# Patient Record
Sex: Female | Born: 1940 | Race: Black or African American | Hispanic: No | Marital: Married | Smoking: Never smoker
Health system: Southern US, Community
[De-identification: ages and names within clinical notes are randomized; demographics above are authoritative.]

## PROBLEM LIST (undated history)

## (undated) DIAGNOSIS — K8020 Calculus of gallbladder without cholecystitis without obstruction: Secondary | ICD-10-CM

## (undated) DIAGNOSIS — R748 Abnormal levels of other serum enzymes: Secondary | ICD-10-CM

## (undated) DIAGNOSIS — R002 Palpitations: Secondary | ICD-10-CM

## (undated) DIAGNOSIS — I10 Essential (primary) hypertension: Secondary | ICD-10-CM

## (undated) DIAGNOSIS — F32A Depression, unspecified: Secondary | ICD-10-CM

## (undated) DIAGNOSIS — R7401 Elevation of levels of liver transaminase levels: Secondary | ICD-10-CM

## (undated) DIAGNOSIS — R740 Nonspecific elevation of levels of transaminase and lactic acid dehydrogenase [LDH]: Secondary | ICD-10-CM

## (undated) DIAGNOSIS — E785 Hyperlipidemia, unspecified: Secondary | ICD-10-CM

## (undated) DIAGNOSIS — K219 Gastro-esophageal reflux disease without esophagitis: Secondary | ICD-10-CM

## (undated) DIAGNOSIS — R7402 Elevation of levels of lactic acid dehydrogenase (LDH): Secondary | ICD-10-CM

## (undated) DIAGNOSIS — F329 Major depressive disorder, single episode, unspecified: Secondary | ICD-10-CM

## (undated) DIAGNOSIS — K743 Primary biliary cirrhosis: Secondary | ICD-10-CM

## (undated) DIAGNOSIS — G8929 Other chronic pain: Secondary | ICD-10-CM

## (undated) HISTORY — DX: Abnormal levels of other serum enzymes: R74.8

## (undated) HISTORY — DX: Essential (primary) hypertension: I10

## (undated) HISTORY — DX: Depression, unspecified: F32.A

## (undated) HISTORY — DX: Gastro-esophageal reflux disease without esophagitis: K21.9

## (undated) HISTORY — DX: Elevation of levels of liver transaminase levels: R74.01

## (undated) HISTORY — PX: OTHER SURGICAL HISTORY: SHX169

## (undated) HISTORY — DX: Calculus of gallbladder without cholecystitis without obstruction: K80.20

## (undated) HISTORY — DX: Other chronic pain: G89.29

## (undated) HISTORY — DX: Primary biliary cirrhosis: K74.3

## (undated) HISTORY — DX: Palpitations: R00.2

## (undated) HISTORY — DX: Nonspecific elevation of levels of transaminase and lactic acid dehydrogenase (ldh): R74.0

## (undated) HISTORY — DX: Hyperlipidemia, unspecified: E78.5

## (undated) HISTORY — DX: Elevation of levels of lactic acid dehydrogenase (LDH): R74.02

## (undated) HISTORY — DX: Major depressive disorder, single episode, unspecified: F32.9

---

## 1965-04-28 HISTORY — PX: TONSILLECTOMY: SUR1361

## 1972-04-28 HISTORY — PX: OTHER SURGICAL HISTORY: SHX169

## 2005-06-12 ENCOUNTER — Ambulatory Visit: Payer: Self-pay | Admitting: Family Medicine

## 2005-07-09 ENCOUNTER — Ambulatory Visit: Payer: Self-pay | Admitting: Family Medicine

## 2005-07-28 ENCOUNTER — Ambulatory Visit: Payer: Self-pay | Admitting: Family Medicine

## 2005-10-22 ENCOUNTER — Ambulatory Visit: Payer: Self-pay | Admitting: Family Medicine

## 2005-12-03 ENCOUNTER — Ambulatory Visit: Payer: Self-pay | Admitting: Family Medicine

## 2006-01-21 ENCOUNTER — Ambulatory Visit: Payer: Self-pay | Admitting: Family Medicine

## 2006-03-24 ENCOUNTER — Ambulatory Visit: Payer: Self-pay | Admitting: Family Medicine

## 2006-03-24 ENCOUNTER — Emergency Department (HOSPITAL_COMMUNITY): Admission: EM | Admit: 2006-03-24 | Discharge: 2006-03-24 | Payer: Self-pay | Admitting: *Deleted

## 2006-04-16 ENCOUNTER — Ambulatory Visit: Payer: Self-pay | Admitting: Family Medicine

## 2007-04-23 DIAGNOSIS — E785 Hyperlipidemia, unspecified: Secondary | ICD-10-CM | POA: Insufficient documentation

## 2007-05-31 ENCOUNTER — Ambulatory Visit: Payer: Self-pay | Admitting: Family Medicine

## 2007-06-03 ENCOUNTER — Encounter: Payer: Self-pay | Admitting: Family Medicine

## 2007-06-03 LAB — CONVERTED CEMR LAB: Pap Smear: NORMAL

## 2007-06-15 ENCOUNTER — Other Ambulatory Visit: Admission: RE | Admit: 2007-06-15 | Discharge: 2007-06-15 | Payer: Self-pay | Admitting: Obstetrics and Gynecology

## 2007-06-15 ENCOUNTER — Encounter: Payer: Self-pay | Admitting: Family Medicine

## 2007-06-21 ENCOUNTER — Ambulatory Visit (HOSPITAL_COMMUNITY): Admission: RE | Admit: 2007-06-21 | Discharge: 2007-06-21 | Payer: Self-pay | Admitting: Obstetrics and Gynecology

## 2007-06-29 ENCOUNTER — Ambulatory Visit: Payer: Self-pay | Admitting: Family Medicine

## 2007-07-19 DIAGNOSIS — K219 Gastro-esophageal reflux disease without esophagitis: Secondary | ICD-10-CM

## 2007-07-19 DIAGNOSIS — G8929 Other chronic pain: Secondary | ICD-10-CM | POA: Insufficient documentation

## 2007-07-19 DIAGNOSIS — R002 Palpitations: Secondary | ICD-10-CM | POA: Insufficient documentation

## 2007-07-19 DIAGNOSIS — I10 Essential (primary) hypertension: Secondary | ICD-10-CM

## 2007-07-19 DIAGNOSIS — F329 Major depressive disorder, single episode, unspecified: Secondary | ICD-10-CM | POA: Insufficient documentation

## 2007-08-02 ENCOUNTER — Ambulatory Visit: Payer: Self-pay | Admitting: Family Medicine

## 2007-08-05 ENCOUNTER — Encounter: Payer: Self-pay | Admitting: Family Medicine

## 2007-09-07 ENCOUNTER — Ambulatory Visit: Payer: Self-pay | Admitting: Cardiology

## 2007-09-13 ENCOUNTER — Ambulatory Visit: Payer: Self-pay | Admitting: Cardiology

## 2007-09-23 ENCOUNTER — Ambulatory Visit: Payer: Self-pay | Admitting: Cardiology

## 2007-11-19 ENCOUNTER — Ambulatory Visit: Payer: Self-pay | Admitting: Cardiology

## 2008-02-02 ENCOUNTER — Telehealth: Payer: Self-pay | Admitting: Family Medicine

## 2008-02-03 ENCOUNTER — Ambulatory Visit: Payer: Self-pay | Admitting: Family Medicine

## 2008-02-03 DIAGNOSIS — R109 Unspecified abdominal pain: Secondary | ICD-10-CM | POA: Insufficient documentation

## 2008-02-03 DIAGNOSIS — R498 Other voice and resonance disorders: Secondary | ICD-10-CM | POA: Insufficient documentation

## 2008-02-03 LAB — CONVERTED CEMR LAB
Bilirubin Urine: NEGATIVE
Blood in Urine, dipstick: NEGATIVE
Nitrite: NEGATIVE
Protein, U semiquant: NEGATIVE

## 2008-02-04 ENCOUNTER — Encounter: Payer: Self-pay | Admitting: Family Medicine

## 2008-02-07 DIAGNOSIS — J320 Chronic maxillary sinusitis: Secondary | ICD-10-CM | POA: Insufficient documentation

## 2008-02-09 ENCOUNTER — Ambulatory Visit (HOSPITAL_COMMUNITY): Admission: RE | Admit: 2008-02-09 | Discharge: 2008-02-09 | Payer: Self-pay | Admitting: Family Medicine

## 2008-02-17 ENCOUNTER — Encounter: Payer: Self-pay | Admitting: Family Medicine

## 2008-02-18 ENCOUNTER — Encounter: Payer: Self-pay | Admitting: Family Medicine

## 2008-02-18 LAB — CONVERTED CEMR LAB: Retic Ct Pct: 0.6 % (ref 0.4–3.1)

## 2008-02-24 ENCOUNTER — Telehealth: Payer: Self-pay | Admitting: Family Medicine

## 2008-03-03 ENCOUNTER — Encounter: Payer: Self-pay | Admitting: Family Medicine

## 2008-03-16 ENCOUNTER — Ambulatory Visit: Payer: Self-pay | Admitting: Family Medicine

## 2008-03-16 DIAGNOSIS — J301 Allergic rhinitis due to pollen: Secondary | ICD-10-CM

## 2008-03-17 LAB — CONVERTED CEMR LAB
Alkaline Phosphatase: 153 units/L — ABNORMAL HIGH (ref 39–117)
BUN: 9 mg/dL (ref 6–23)
Basophils Absolute: 0 10*3/uL (ref 0.0–0.1)
Bilirubin, Direct: 0.1 mg/dL (ref 0.0–0.3)
Calcium: 9.1 mg/dL (ref 8.4–10.5)
Chloride: 102 meq/L (ref 96–112)
Eosinophils Absolute: 0.3 10*3/uL (ref 0.0–0.7)
Eosinophils Relative: 6 % — ABNORMAL HIGH (ref 0–5)
Glucose, Bld: 83 mg/dL (ref 70–99)
HCT: 35.6 % — ABNORMAL LOW (ref 36.0–46.0)
LDL Cholesterol: 138 mg/dL — ABNORMAL HIGH (ref 0–99)
MCV: 92.5 fL (ref 78.0–100.0)
Neutro Abs: 2.8 10*3/uL (ref 1.7–7.7)
RBC: 3.85 M/uL — ABNORMAL LOW (ref 3.87–5.11)
Triglycerides: 113 mg/dL (ref <150)
VLDL: 23 mg/dL (ref 0–40)
WBC: 5.2 10*3/uL (ref 4.0–10.5)

## 2008-04-05 ENCOUNTER — Encounter: Payer: Self-pay | Admitting: Family Medicine

## 2008-05-30 ENCOUNTER — Ambulatory Visit: Payer: Self-pay | Admitting: Family Medicine

## 2008-05-31 ENCOUNTER — Telehealth: Payer: Self-pay | Admitting: Family Medicine

## 2008-06-01 ENCOUNTER — Encounter: Payer: Self-pay | Admitting: Family Medicine

## 2008-06-05 ENCOUNTER — Telehealth: Payer: Self-pay | Admitting: Family Medicine

## 2008-07-05 ENCOUNTER — Encounter: Payer: Self-pay | Admitting: Family Medicine

## 2008-07-07 ENCOUNTER — Encounter: Payer: Self-pay | Admitting: Family Medicine

## 2008-08-01 ENCOUNTER — Encounter: Payer: Self-pay | Admitting: Family Medicine

## 2008-08-02 LAB — CONVERTED CEMR LAB: LDL Cholesterol: 120 mg/dL — ABNORMAL HIGH (ref 0–99)

## 2008-08-16 ENCOUNTER — Ambulatory Visit: Payer: Self-pay | Admitting: Family Medicine

## 2008-08-20 DIAGNOSIS — E663 Overweight: Secondary | ICD-10-CM | POA: Insufficient documentation

## 2008-08-23 ENCOUNTER — Telehealth: Payer: Self-pay | Admitting: Family Medicine

## 2008-11-06 ENCOUNTER — Telehealth: Payer: Self-pay | Admitting: Family Medicine

## 2008-11-13 ENCOUNTER — Telehealth: Payer: Self-pay | Admitting: Family Medicine

## 2008-12-18 ENCOUNTER — Telehealth: Payer: Self-pay | Admitting: Family Medicine

## 2009-05-28 ENCOUNTER — Ambulatory Visit: Payer: Self-pay | Admitting: Family Medicine

## 2009-06-12 ENCOUNTER — Encounter: Payer: Self-pay | Admitting: Family Medicine

## 2009-06-13 ENCOUNTER — Telehealth: Payer: Self-pay | Admitting: Family Medicine

## 2009-06-18 ENCOUNTER — Encounter: Payer: Self-pay | Admitting: Family Medicine

## 2009-06-18 LAB — CONVERTED CEMR LAB
BUN: 8 mg/dL (ref 6–23)
Basophils Absolute: 0 10*3/uL (ref 0.0–0.1)
CO2: 25 meq/L (ref 19–32)
Cholesterol: 198 mg/dL (ref 0–200)
Creatinine, Ser: 0.64 mg/dL (ref 0.40–1.20)
HCT: 34.9 % — ABNORMAL LOW (ref 36.0–46.0)
Lymphs Abs: 1.8 10*3/uL (ref 0.7–4.0)
MCHC: 31.8 g/dL (ref 30.0–36.0)
MCV: 90.2 fL (ref 78.0–100.0)
Neutro Abs: 2.3 10*3/uL (ref 1.7–7.7)
Potassium: 4.2 meq/L (ref 3.5–5.3)
RBC: 3.87 M/uL (ref 3.87–5.11)
Sodium: 139 meq/L (ref 135–145)
Total CHOL/HDL Ratio: 3.7
VLDL: 35 mg/dL (ref 0–40)
WBC: 4.8 10*3/uL (ref 4.0–10.5)

## 2009-06-20 ENCOUNTER — Telehealth: Payer: Self-pay | Admitting: Family Medicine

## 2009-06-20 ENCOUNTER — Encounter: Payer: Self-pay | Admitting: Family Medicine

## 2009-06-22 ENCOUNTER — Encounter: Payer: Self-pay | Admitting: Family Medicine

## 2009-06-22 ENCOUNTER — Telehealth: Payer: Self-pay | Admitting: Family Medicine

## 2009-06-22 DIAGNOSIS — R5383 Other fatigue: Secondary | ICD-10-CM | POA: Insufficient documentation

## 2009-06-22 DIAGNOSIS — R5381 Other malaise: Secondary | ICD-10-CM

## 2009-06-22 DIAGNOSIS — D649 Anemia, unspecified: Secondary | ICD-10-CM

## 2009-07-16 ENCOUNTER — Encounter: Payer: Self-pay | Admitting: Family Medicine

## 2009-07-26 ENCOUNTER — Telehealth: Payer: Self-pay | Admitting: Family Medicine

## 2009-08-03 ENCOUNTER — Encounter: Payer: Self-pay | Admitting: Family Medicine

## 2009-08-27 ENCOUNTER — Encounter: Payer: Self-pay | Admitting: Family Medicine

## 2009-10-04 ENCOUNTER — Ambulatory Visit: Payer: Self-pay | Admitting: Family Medicine

## 2009-10-04 DIAGNOSIS — N644 Mastodynia: Secondary | ICD-10-CM | POA: Insufficient documentation

## 2009-10-15 ENCOUNTER — Encounter: Payer: Self-pay | Admitting: Family Medicine

## 2009-10-23 ENCOUNTER — Telehealth: Payer: Self-pay | Admitting: Family Medicine

## 2009-11-22 ENCOUNTER — Ambulatory Visit: Payer: Self-pay | Admitting: Otolaryngology

## 2009-11-26 ENCOUNTER — Encounter: Payer: Self-pay | Admitting: Family Medicine

## 2009-12-26 ENCOUNTER — Ambulatory Visit: Payer: Self-pay | Admitting: Family Medicine

## 2010-02-25 ENCOUNTER — Ambulatory Visit: Payer: Self-pay | Admitting: Family Medicine

## 2010-04-17 ENCOUNTER — Telehealth: Payer: Self-pay | Admitting: Family Medicine

## 2010-05-19 ENCOUNTER — Encounter: Payer: Self-pay | Admitting: Family Medicine

## 2010-05-23 ENCOUNTER — Ambulatory Visit
Admission: RE | Admit: 2010-05-23 | Discharge: 2010-05-23 | Payer: Self-pay | Source: Home / Self Care | Attending: Otolaryngology | Admitting: Otolaryngology

## 2010-05-28 NOTE — Assessment & Plan Note (Signed)
Summary: office visit   Vital Signs:  Patient profile:   70 year old female Menstrual status:  postmenopausal Height:      65 inches Weight:      175.25 pounds BMI:     29.27 O2 Sat:      96 % on Room air Pulse rate:   68 / minute Pulse rhythm:   regular Resp:     16 per minute BP sitting:   140 / 82  (left arm)  Vitals Entered By: Mauricia Area CMA (February 25, 2010 11:30 AM)  Nutrition Counseling: Patient's BMI is greater than 25 and therefore counseled on weight management options.  O2 Flow:  Room air CC: Follow up   Primary Care Mykiah Schmuck:  Syliva Overman MD  CC:  Follow up.  History of Present Illness: Reports  that she has been doing well. She is here primarily for bP review.She denies any adverse side effects from her meds. Denies recent fever or chills. Denies sinus pressure, nasal congestion , ear pain or sore throat. Denies chest congestion, or cough productive of sputum. Denies chest pain, palpitations, PND, orthopnea or leg swelling. Denies abdominal pain, nausea, vomitting, diarrhea or constipation. Denies change in bowel movements or bloody stool. Denies dysuria , frequency, incontinence or hesitancy. Denies  joint pain, swelling, or reduced mobility. Denies headaches, vertigo, seizures. Denies depression, anxiety or insomnia. Denies  rash, lesions, or itch.     Current Medications (verified): 1)  Calcium-Vitamin D 500-125 Mg-Unit  Tabs (Calcium-Vitamin D) .... Take 1 Tablet By Mouth Two Times A Day 2)  Iron Cr 50 Mg  Cpcr (Ferrous Sulfate) .... Take 1 Tablet By Mouth Two Times A Day 3)  Aspirin 81 Mg  Tbec (Aspirin) .... Take 1 Tablet By Mouth Once A Day 4)  Omega-3 350 Mg Caps (Omega-3 Fatty Acids) .... Take One Tab By Mouth Once Daily 5)  Selenium 100 Mcg Tabs (Selenium) .... Take One Tab By Mouth Once Daily 6)  L-Carnitine 250 Mg Caps (Levocarnitine) .... Take 1-4 Caps By Mouth Once Daily 7)  Benadryl-D Allergy/sinus 25-10 Mg Tabs  (Diphenhydramine-Phenylephrine) .... Take One Tab By Mouth Every 4 Hours 8)  Benicar 40 Mg Tabs (Olmesartan Medoxomil) .... One Tab By Mouth Qd 9)  Nasonex 50 Mcg/act Susp (Mometasone Furoate) .... Uad 10)  Omeprazole 20 Mg Cpdr (Omeprazole) .... Take 1 Capsule By Mouth Two Times A Day 11)  Clonidine Hcl 0.2 Mg Tabs (Clonidine Hcl) .... Take 1 Tab By Mouth At Bedtime 12)  Multi Vitamin .Marland Kitchen.. 1 Tab Daily 13)  Ceraplex 1181 Mg .Marland Kitchen.. 1 Tab Daily 14)  Calcium, Magnesium, Zinc .... 1 Tab Daily 15)  Super Tonic .... Add 1/2 Drop in 4 Oz Water 16)  Mary's Magic Mouthwash [place]addock .... Swish 5 Ml 17)  [place] Grape Root 400 Mg .Marland Kitchen.. 1 Tab Daily 18)  Msm Sulfur 1000 Mg .Marland Kitchen.. 1 Tab Daily 19)  Super Digestaway .Marland Kitchen.. 1 Tab Daily 20)  Velerian .Marland Kitchen.. 1 Tab Daily 21)  Immune Support  Allergies (verified): 1)  ! Sulfa  Review of Systems      See HPI General:  Complains of sleep disorder. Eyes:  Denies discharge, eye pain, and red eye. Endo:  Denies cold intolerance, excessive thirst, excessive urination, and heat intolerance. Heme:  Denies abnormal bruising and bleeding. Allergy:  Complains of seasonal allergies.  Physical Exam  General:  Well-developed,well-nourished,in no acute distress; alert,appropriate and cooperative throughout examination HEENT: No facial asymmetry,  EOMI, No sinus tenderness, TM's  Clear, oropharynx  pink and moist.   Chest: Clear to auscultation bilaterally.   CVS: S1, S2, No murmurs, No S3.   Abd: Soft, Nontender.  ZO:[id number] ROM spine,adequate in  hips, shoulders and knees.  Ext: No edema.   CNS: CN 2-12 intact, power tone and sensation normal throughout.   Skin: Intact, no visible lesions or rashes.  Psych: Good eye contact, normal affect.  Memory intact, not anxious or depressed appearing.    Impression & Recommendations:  Problem # 1:  HYPERTENSION (ICD-401.9) Assessment Improved  Her updated medication list for this problem includes:    Benicar 40 Mg Tabs  (Olmesartan medoxomil) ..... One tab by mouth qd    Clonidine Hcl 0.2 Mg Tabs (Clonidine hcl) .Marland Kitchen... Take 1 tab by mouth at bedtime  Orders: T-Basic Metabolic Panel [id number])  BP today: 140/82 Prior BP: 150/90 (12/26/2009)  Labs Reviewed: K+: 4.2 (06/15/2009) Creat: : 0.64 (06/15/2009)   Chol: 198 (06/15/2009)   HDL: 54 (06/15/2009)   LDL: 109 (06/15/2009)   TG: 175 (06/15/2009)  Problem # 2:  GERD (ICD-530.81) Assessment: Improved  Her updated medication list for this problem includes:    Omeprazole 20 Mg Cpdr (Omeprazole) .Marland Kitchen... Take 1 capsule by mouth two times a day  Problem # 3:  HYPERLIPIDEMIA (ICD-272.4) Assessment: Comment Only  Orders: T-Lipid Profile ([id number]) T-Hepatic Function [phone]) Low fat dietdiscussed and encouraged  Labs Reviewed: SGOT: 38 (02/17/2008)   SGPT: 33 (02/17/2008)   HDL:54 (06/15/2009), 49 (08/01/2008)  LDL:109 (06/15/2009), 120 (08/01/2008)  Chol:198 (06/15/2009), 191 (08/01/2008)  Trig:175 (06/15/2009), 109 (08/01/2008)  Problem # 4:  OVERWEIGHT (ICD-278.02) Assessment: Unchanged  Ht: 65 (02/25/2010)   Wt: 175.25 (02/25/2010)   BMI: 29.27 (02/25/2010) therapeutic lifestyle change discussed and encouraged  Complete Medication List: 1)  Calcium-vitamin D 500-125 Mg-unit Tabs (Calcium-vitamin d) .... Take 1 tablet by mouth two times a day 2)  Iron Cr 50 Mg Cpcr (Ferrous sulfate) .... Take 1 tablet by mouth two times a day 3)  Aspirin 81 Mg Tbec (Aspirin) .... Take 1 tablet by mouth once a day 4)  Omega-3 350 Mg Caps (Omega-3 fatty acids) .... Take one tab by mouth once daily 5)  Selenium 100 Mcg Tabs (Selenium) .... Take one tab by mouth once daily 6)  L-carnitine 250 Mg Caps (Levocarnitine) .... Take 1-4 caps by mouth once daily 7)  Benadryl-d Allergy/sinus 25-10 Mg Tabs (Diphenhydramine-phenylephrine) .... Take one tab by mouth every 4 hours 8)  Benicar 40 Mg Tabs (Olmesartan medoxomil) .... One tab by mouth qd 9)  Nasonex 50  Mcg/act Susp (Mometasone furoate) .... Uad 10)  Omeprazole 20 Mg Cpdr (Omeprazole) .... Take 1 capsule by mouth two times a day 11)  Clonidine Hcl 0.2 Mg Tabs (Clonidine hcl) .... Take 1 tab by mouth at bedtime 12)  Multi Vitamin  .Marland Kitchen.. 1 tab daily 13)  Ceraplex 1181 Mg  .Marland Kitchen.. 1 tab daily 14)  Calcium, Magnesium, Zinc  .... 1 tab daily 15)  Super Tonic  .... Add 1/2 drop in 4 oz water 16)  Mary's Magic Mouthwash [place]  .... Swish 5 ml 17)  [place] Grape Root 400 Mg  .Marland Kitchen.. 1 tab daily 18)  Msm Sulfur 1000 Mg  .Marland Kitchen.. 1 tab daily 19)  Super Digestaway  .Marland KitchenMarland Kitchen. 1 tab daily 20)  Velerian  .Marland Kitchen.. 1 tab daily 21)  Immune Support   Other Orders: T-CBC w/Diff ([id number]) T-TSH ([id number]) Influenza Vaccine MCR ([id number])  Patient Instructions: 1)  Please schedule a follow-up appointment  in 4 months. 2)  It is important that you exercise regularly at least 20 minutes 5 times a week. If you develop chest pain, have severe difficulty breathing, or feel very tired , stop exercising immediately and seek medical attention. 3)  You need to lose weight. Consider a lower calorie diet and regular exercise.  4)  BMP prior to visit, ICD-9: 5)  Lipid Panel prior to visit, ICD-9:  fasting in 4.5 months 6)  TSH prior to visit, ICD-9: 7)  CBC w/ Diff prior to visit, ICD-9: 8)  Your blood pressure is much improved.   Orders Added: 1)  Est. Patient Level IV [11914] 2)  T-Basic Metabolic Panel [[id number]] 3)  T-Lipid Profile [[id number]] 4)  T-Hepatic Function [[id number]] 5)  T-CBC w/Diff [[id number]] 6)  T-TSH [[id number]] 7)  Influenza Vaccine MCR [[id number]]   Immunizations Administered:  Influenza Vaccine # 1:    Vaccine Type: Fluvax MCR    Site: left deltoid    Mfr: novartis    Dose: 0.5 ml    Route: IM    Given by: Adella Hare LPN    Exp. Date: 08/2010    Lot #: [id number]    VIS given: 11/20/09 version given February 25, 2010.   Immunizations Administered:  Influenza Vaccine # 1:     Vaccine Type: Fluvax MCR    Site: left deltoid    Mfr: novartis    Dose: 0.5 ml    Route: IM    Given by: Adella Hare LPN    Exp. Date: 08/2010    Lot #: [id number]    VIS given: 11/20/09 version given February 25, 2010.

## 2010-05-28 NOTE — Letter (Signed)
Summary: Letter  Letter   Imported By: Lind Guest 06/21/2009 11:05:14  _____________________________________________________________________  External Attachment:    Type:   Image     Comment:   External Document

## 2010-05-28 NOTE — Progress Notes (Signed)
Summary: SU Philomena Doheny MD  SU Philomena Doheny MD   Imported By: Lind Guest 08/31/2009 08:53:33  _____________________________________________________________________  External Attachment:    Type:   Image     Comment:   External Document

## 2010-05-28 NOTE — Letter (Signed)
Summary: dr. Newman Pies office notes  dr. Newman Pies office notes   Imported By: Curtis Sites 11/27/2009 11:37:10  _____________________________________________________________________  External Attachment:    Type:   Image     Comment:   External Document

## 2010-05-28 NOTE — Progress Notes (Signed)
Summary: referral to [facility]  Phone Note Other Incoming   Caller: dr Zachariah Pavek Summary of Call: pls sched [place] of neck to eval swelling and goiter, shewants it in [place] Initial call taken by: Syliva Overman MD,  June 20, 2009 11:46 AM  Follow-up for Phone Call        pt has appt at [facility] for 06/22/2009 12:30. pt notified  Follow-up by: Rudene Anda,  June 20, 2009 1:21 PM

## 2010-05-28 NOTE — Procedures (Signed)
Summary: Gastroenterology  Gastroenterology   Imported By: Lind Guest 06/15/2009 08:11:17  _____________________________________________________________________  External Attachment:    Type:   Image     Comment:   External Document

## 2010-05-28 NOTE — Assessment & Plan Note (Signed)
Summary: office visit   Vital Signs:  Patient profile:   70 year old female Menstrual status:  postmenopausal Height:      65 inches Weight:      174 pounds BMI:     29.06 O2 Sat:      98 % Pulse rate:   71 / minute Pulse rhythm:   regular Resp:     16 per minute BP sitting:   160 / 90  (left arm) Cuff size:   regular  Vitals Entered By: Everitt Amber LPN (October 04, 1608 11:06 AM)  Nutrition Counseling: Patient's BMI is greater than 25 and therefore counseled on weight management options. CC: had a backache all week, it moves around so she thinks its arthritis. taking tylenol arthritis, it helps when she takes it. But she didn't know if she could take it with her conditions. Found a lump in her left breast but sometimes it's there and sometimes its not   CC:  had a backache all week, it moves around so she thinks its arthritis. taking tylenol arthritis, and it helps when she takes it. But she didn't know if she could take it with her conditions. Found a lump in her left breast but sometimes it's there and sometimes its not.  History of Present Illness: light headed but taking the meds consitenetly at the same time  2 week h/o left breast mass and pain. intermittent h/o low and mid back pain, non radiating.  She denies dysuria, frequency or hematuria. She denies anyu ecent fever or chills. She does have mild chronic fatigue and poor sleep. She denies head or chst congestion. She denies chest pain, palpitations, pND or orthopnea. She was evaluated by ENT, her chronic hoarseness is due to untreated reflux, no change in dx , I explained this to her and encouraged her to be compliant with treatment.  Current Medications (verified): 1)  Calcium-Vitamin D 500-125 Mg-Unit  Tabs (Calcium-Vitamin D) .... Take 1 Tablet By Mouth Two Times A Day 2)  Iron Cr 50 Mg  Cpcr (Ferrous Sulfate) .... Take 1 Tablet By Mouth Two Times A Day 3)  Aspirin 81 Mg  Tbec (Aspirin) .... Take 1 Tablet By Mouth  Once A Day 4)  Omega-3 350 Mg Caps (Omega-3 Fatty Acids) .... Take One Tab By Mouth Once Daily 5)  Selenium 100 Mcg Tabs (Selenium) .... Take One Tab By Mouth Once Daily 6)  L-Carnitine 250 Mg Caps (Levocarnitine) .... Take 1-4 Caps By Mouth Once Daily 7)  Benadryl-D Allergy/sinus 25-10 Mg Tabs (Diphenhydramine-Phenylephrine) .... Take One Tab By Mouth Every 4 Hours 8)  Vitamin C 500 Mg Tabs (Ascorbic Acid) .... Take 1 Tablet By Mouth Once A Day 9)  Benicar 40 Mg Tabs (Olmesartan Medoxomil) .... One Tab By Mouth Qd 10)  Nasonex 50 Mcg/act Susp (Mometasone Furoate) .... Uad 11)  Clonidine Hcl 0.1 Mg Tabs (Clonidine Hcl) .... Take One and One Half Tabs Once Daily 12)  Omeprazole 40 Mg Cpdr (Omeprazole) .... Take 1 Capsule By Mouth Once A Day  Allergies (verified): 1)  ! Sulfa  Review of Systems      See HPI General:  Denies chills and fatigue. Eyes:  Denies blurring and discharge. ENT:  Complains of hoarseness; chronic painless. GI:  Complains of abdominal pain, constipation, and indigestion; denies diarrhea, nausea, and vomiting blood. MS:  Complains of low back pain and mid back pain; denies joint pain, muscle weakness, and stiffness; intermittent. Derm:  Complains of lesion(s); recent  concern aboput breast lump. Psych:  Denies anxiety and depression. Endo:  Denies cold intolerance, excessive hunger, excessive thirst, excessive urination, heat intolerance, polyuria, and weight change. Heme:  Denies abnormal bruising and bleeding. Allergy:  Complains of seasonal allergies; denies hives or rash and itching eyes.  Physical Exam  General:  Well-developed,well-nourished,in no acute distress; alert,appropriate and cooperative throughout examination HEENT: No facial asymmetry,  EOMI, No sinus tenderness, TM's Clear, oropharynx  pink and moist.   Chest: Clear to auscultation bilaterally.  Breast; no masses, nipple d/c or adenopathy. Tender left breast CVS: S1, S2, No murmurs, No S3.   Abd:  Soft, Nontender.  MW:[id number] ROM spine,adequate in  hips, shoulders and knees.  Ext: No edema.   CNS: CN 2-12 intact, power tone and sensation normal throughout.   Skin: Intact, no visible lesions or rashes.  Psych: Good eye contact, normal affect.  Memory intact, not anxious or depressed appearing.    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 One tab by mouth qd    Clonidine Hcl 0.1 Mg Tabs (Clonidine hcl) .Marland Kitchen... Take one and one half tabs once daily  BP today: 160/90 Prior BP: 160/90 (05/28/2009)  Labs Reviewed: K+: 4.2 (06/15/2009) Creat: : 0.64 (06/15/2009)   Chol: 198 (06/15/2009)   HDL: 54 (06/15/2009)   LDL: 109 (06/15/2009)   TG: 175 (06/15/2009)  Problem # 5:  HYPERLIPIDEMIA (ICD-272.4) Assessment: Improved  Labs Reviewed: SGOT: 38 (02/17/2008)   SGPT: 33 (02/17/2008)   HDL:54 (06/15/2009), 49 (08/01/2008)  LDL:109 (06/15/2009), 120 (08/01/2008)  Chol:198 (06/15/2009), 191 (08/01/2008)  Trig:175 (06/15/2009), 109 (08/01/2008)  Complete Medication List: 1)  Calcium-vitamin D 500-125 Mg-unit Tabs (Calcium-vitamin d) .... Take 1 tablet by mouth two times a day 2)  Iron Cr 50 Mg Cpcr (Ferrous sulfate) .... Take 1 tablet by mouth two times a day 3)  Aspirin 81 Mg Tbec (Aspirin) .... Take 1 tablet by mouth once a day 4)  Omega-3 350 Mg Caps (Omega-3 fatty acids) .... Take one tab by mouth once daily 5)   Selenium 100 Mcg Tabs (Selenium) .... Take one tab by mouth once daily 6)  L-carnitine 250 Mg Caps (Levocarnitine) .... Take 1-4 caps by mouth once daily 7)  Benadryl-d Allergy/sinus 25-10 Mg Tabs (Diphenhydramine-phenylephrine) .... Take one tab by mouth every 4 hours 8)  Vitamin C 500 Mg Tabs (Ascorbic acid) .... Take 1 tablet by mouth once a day 9)  Benicar 40 Mg Tabs (Olmesartan medoxomil) .... One tab by mouth qd 10)  Nasonex 50 Mcg/act Susp (Mometasone furoate) .... Uad 11)  Clonidine Hcl 0.1 Mg Tabs (Clonidine hcl) .... Take one and one half tabs once daily 12)  Omeprazole 20 Mg Cpdr (Omeprazole) .... Take 1 capsule by mouth two times a day  Patient Instructions: 1)  Please schedule a follow-up appointment in 2.5 months. 2)  It is important that you exercise regularly at least 20 minutes 5 times a week. If you develop chest pain, have severe difficulty  breathing, or feel very tired , stop exercising immediately and seek medical attention. 3)  You need to lose weight. Consider a lower calorie diet and regular exercise.  4)  Pls get your mamo asap we will sched  before you lv. 5)  It is vital you take your reflux meds as prescribed and return for f/u with ENT. 6)  Youir BP is high, but the reason I believe it is is because you are not taking the meds on a regular constent basis , pls take miidday and midnight. 7)  kEEP YOUNG  AND CONGRATS Prescriptions: OMEPRAZOLE 20 MG CPDR (OMEPRAZOLE) Take 1 capsule by mouth two times a day  #60 x 3   Entered and Authorized by:   Syliva Overman MD   Signed by:   Syliva Overman MD on 10/04/2009   Method used:   Electronically to        [facility] (retail)       [address]       [place], [place]  [address]       Ph: [phone]       Fax: [phone]   RxID:   [id number]

## 2010-05-28 NOTE — Progress Notes (Signed)
Summary: referral  Phone Note Call from Patient   Caller: dr simcpson Summary of Call: pls refer ptto dr Danice Goltz hopefully in [place] asap eval thyroid nodules and chronic hoatrseness, faxuS reports Initial call taken by: Syliva Overman MD,  July 26, 2009 12:08 PM  Follow-up for Phone Call        pt has appt with dr. Christain [place] for 07/30/2009 2:00. pt was called and notified  Follow-up by: Rudene Anda,  July 26, 2009 3:56 PM

## 2010-05-28 NOTE — Progress Notes (Signed)
  Phone Note Other Incoming   Caller: dr Avira Tillison Summary of Call: plstell pt Dr Jerelyn Scott hematology reviewed her labs. He advised thast she discontinue all supplemental vitamins and minerals for 3 months, then I rept the cbc and anemia panel , he believes the lab abnormalitiesare dure to excessive supplements  Initial call taken by: Syliva Overman MD,  June 22, 2009 5:21 PM  Follow-up for Phone Call        Patient aware to hold off for 3 months and repeat bloodwork Follow-up by: Everitt Amber LPN,  June 25, 2009 9:57 AM  New Problems: ANEMIA (ICD-285.9) FATIGUE (ICD-780.79)   New Problems: ANEMIA (ICD-285.9) FATIGUE (ICD-780.79)

## 2010-05-28 NOTE — Progress Notes (Signed)
Summary: SLUGGISH FEELING  Phone Note Call from Patient   Summary of Call: SHE HAS FINISHED THE PEN. AND SHE STILL FEELS  SLUGGISH, NO ENERGY  AND CHEST STILL HURTS WANTS TO KNOW WHAT TO DO  CALL BACK AT [phone] Initial call taken by: Lind Guest,  June 13, 2009 2:42 PM  Follow-up for Phone Call        advise that i had ordered labs that would certainly help to understand why she feels sluggish, also if she is coughing , she needs a CXR and sputum c/s i need lab data to explain why she still has symptoms after a courseof treatment. Follow-up by: Syliva Overman MD,  June 13, 2009 5:14 PM  Additional Follow-up for Phone Call Additional follow up Details #1::        patient aware Additional Follow-up by: Adella Hare LPN,  June 14, 2009 1:40 PM

## 2010-05-28 NOTE — Progress Notes (Signed)
  Phone Note From Pharmacy   Caller: [facility] Summary of Call: insurance requires pa for both 20mg  two times a day and 40mg  once daily no preferred listed Initial call taken by: Adella Hare LPN,  October 23, 2009 10:45 AM  Follow-up for Phone Call        unsure whatr med you aare talking about , but pls send for pa form and fill in as able Follow-up by: Syliva Overman MD,  October 23, 2009 12:21 PM  Additional Follow-up for Phone Call Additional follow up Details #1::        spoke with pharmacy, they had some incorrect insurance info, will sort out and they stated after correction, if PA is neccessary they will let us know Additional Follow-up by: Adella Hare LPN,  November 07, 2009 11:46 AM

## 2010-05-28 NOTE — Procedures (Signed)
Summary: Gastroenterology  Gastroenterology   Imported By: Lind Guest 07/24/2009 13:13:43  _____________________________________________________________________  External Attachment:    Type:   Image     Comment:   External Document

## 2010-05-28 NOTE — Assessment & Plan Note (Signed)
Summary: OV   Vital Signs:  Patient profile:   70 year old female Menstrual status:  postmenopausal Height:      65 inches Weight:      175 pounds BMI:     29.23 O2 Sat:      100 % on Room air Pulse rate:   74 / minute Pulse rhythm:   regular Resp:     16 per minute BP sitting:   160 / 90  (right arm) Cuff size:   regular  Vitals Entered By: Everitt Amber (May 28, 2009 4:00 PM)  Nutrition Counseling: Patient's BMI is greater than 25 and therefore counseled on weight management options.  O2 Flow:  Room air CC: achy all over and clear mucus in throat, scratchy, just feeling bad since jan 10   CC:  achy all over and clear mucus in throat, scratchy, and just feeling bad since jan 10.  History of Present Illness: C/O  generalised body aches, tender left submandibular gland, dry scratchy painful thrioat ant nasal drainage clear to green, cough x 4 weeks, takin chloraectin . Reports  that she had been doing well prior to this. Denies recent fever or chills. Denies sinus pressure, nasal congestion , ear pain or sore throat. Denies chest congestion, or cough productive of sputum. Denies chest pain, palpitations, PND, orthopnea or leg swelling. Denies abdominal pain, nausea, vomitting, diarrhea or constipation. Denies change in bowel movements or bloody stool. Denies dysuria , frequency, incontinence or hesitancy. Denies  joint pain, swelling, or reduced mobility. Denies headaches, vertigo, seizures. Denies depression, anxiety or insomnia. Denies  rash, lesions, or itch.     Current Medications (verified): 1)  Calcium-Vitamin D 500-125 Mg-Unit  Tabs (Calcium-Vitamin D) .... Take 1 Tablet By Mouth Two Times A Day 2)  Iron Cr 50 Mg  Cpcr (Ferrous Sulfate) .... Take 1 Tablet By Mouth Two Times A Day 3)  Aspirin 81 Mg  Tbec (Aspirin) .... Take 1 Tablet By Mouth Once A Day 4)  Omega-3 350 Mg Caps (Omega-3 Fatty Acids) .... Take One Tab By Mouth Once Daily 5)  Selenium 100 Mcg Tabs  (Selenium) .... Take One Tab By Mouth Once Daily 6)  L-Carnitine 250 Mg Caps (Levocarnitine) .... Take 1-4 Caps By Mouth Once Daily 7)  Benadryl-D Allergy/sinus 25-10 Mg Tabs (Diphenhydramine-Phenylephrine) .... Take One Tab By Mouth Every 4 Hours 8)  Vitamin C 500 Mg Tabs (Ascorbic Acid) .... Take 1 Tablet By Mouth Once A Day 9)  Benicar 40 Mg Tabs (Olmesartan Medoxomil) .... One Tab By Mouth Qd 10)  Nasonex 50 Mcg/act Susp (Mometasone Furoate) .... Uad 11)  Clonidine Hcl 0.1 Mg Tabs (Clonidine Hcl) .... Take One and One Half Tabs Once Daily  Allergies (verified): 1)  ! Sulfa  Review of Systems General:  Complains of chills, fatigue, and malaise. Eyes:  Denies blurring and discharge. ENT:  Complains of hoarseness, nasal congestion, postnasal drainage, and sinus pressure; denies earache. CV:  Complains of palpitations; denies chest pain or discomfort, difficulty breathing while lying down, shortness of breath with exertion, and swelling of feet; worse with drinking black tea. Resp:  Denies cough, sputum productive, and wheezing. GI:  Complains of abdominal pain, gas, indigestion, and nausea; denies constipation, diarrhea, and vomiting; abdominal pain and bloating with  food, still has not decided  to get gall bladder surgery.. GU:  Denies dysuria and urinary frequency. MS:  Denies joint pain and stiffness.  Physical Exam  General:  Well-developed,well-nourished,in no acute distress; alert,appropriate  and cooperative throughout examination HEENT: No facial asymmetry,  EOMI, maxillary  sinus tenderness, TM's Clear, oropharynx  pink and moist.   Chest: decresed air entry, scattered crackles and wheezes  CVS: S1, S2, No murmurs, No S3.   Abd: Soft, Nontender.  MS: Adequate ROM spine, hips, shoulders and knees.  Ext: No edema.   CNS: CN 2-12 intact, power tone and sensation normal throughout.   Skin: Intact, no visible lesions or rashes.  Psych: Good eye contact, normal affect.  Memory  intact, not anxious or depressed appearing.    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 Take one tab by mouth every 4 hours    Nasonex 50 Mcg/act Susp (Mometasone furoate) ..... Uad    Penicillin V Potassium 500 Mg Tabs (Penicillin v potassium) .Marland Kitchen... Take 1 tablet by mouth three times a day    Tessalon Perles 100 Mg Caps (Benzonatate) .Marland Kitchen... Take 1 capsule by mouth three times a day  Problem # 2:  ACUTE BRONCHITIS (ICD-466.0) Assessment: Comment Only  Her updated medication list for this problem includes:    Benadryl-d Allergy/sinus 25-10 Mg Tabs (Diphenhydramine-phenylephrine) .Marland Kitchen... Take one tab by mouth every 4 hours    Penicillin V Potassium 500 Mg Tabs (Penicillin v potassium) .Marland Kitchen... Take 1 tablet by mouth three times a day    Tessalon Perles 100 Mg Caps (Benzonatate) .Marland Kitchen... Take 1 capsule by mouth three times a day  Problem # 3:  OVERWEIGHT (ICD-278.02) Assessment: Unchanged  Ht: 65 (05/28/2009)   Wt: 175 (05/28/2009)   BMI: 29.23 (05/28/2009)  Problem # 4:  HYPERTENSION (ICD-401.9) Assessment: Deteriorated  Her updated medication list for this problem includes:    Benicar 40 Mg Tabs (Olmesartan medoxomil) ..... One tab by mouth qd    Clonidine Hcl 0.1 Mg Tabs (Clonidine hcl) .Marland Kitchen... Take one and one half tabs once daily  Orders: T-Basic Metabolic Panel [id number])  BP today: 160/90 Prior BP: 130/80 (08/16/2008)  Labs Reviewed: K+: 4.0 (02/17/2008) Creat: : 0.81 (02/17/2008)   Chol: 191 (08/01/2008)   HDL: 49 (08/01/2008)   LDL: 120 (08/01/2008)   TG: 109 (08/01/2008)  Problem # 5:  HYPERLIPIDEMIA (ICD-272.4) Assessment: Comment Only  Orders: T-Lipid Profile [id number])  Labs Reviewed: SGOT: 38 (02/17/2008)   SGPT: 33 (02/17/2008)   HDL:49 (08/01/2008), 53  (29/56/2130)  LDL:120 (08/01/2008), 138 (02/17/2008)  Chol:191 (08/01/2008), 214 (02/17/2008)  Trig:109 (08/01/2008), 113 (02/17/2008)  Complete Medication List: 1)  Calcium-vitamin D 500-125 Mg-unit Tabs (Calcium-vitamin d) .... Take 1 tablet by mouth two times a day 2)  Iron Cr 50 Mg Cpcr (Ferrous sulfate) .... Take 1 tablet by mouth two times a day 3)  Aspirin 81 Mg Tbec (Aspirin) .... Take 1 tablet by mouth once a day 4)  Omega-3 350 Mg Caps (Omega-3 fatty acids) .... Take one tab by mouth once daily 5)  Selenium 100 Mcg Tabs (Selenium) .... Take one tab by mouth once daily 6)  L-carnitine 250 Mg Caps (Levocarnitine) .... Take 1-4 caps by mouth once daily 7)  Benadryl-d Allergy/sinus 25-10 Mg Tabs (Diphenhydramine-phenylephrine) .... Take one tab by mouth every 4 hours 8)  Vitamin C 500 Mg Tabs (Ascorbic acid) .... Take 1 tablet by mouth once a day 9)  Benicar 40 Mg Tabs (Olmesartan medoxomil) .... One tab by mouth qd 10)  Nasonex 50 Mcg/act Susp (Mometasone furoate) .Marland KitchenMarland KitchenMarland Kitchen  Uad 11)  Clonidine Hcl 0.1 Mg Tabs (Clonidine hcl) .... Take one and one half tabs once daily 12)  Penicillin V Potassium 500 Mg Tabs (Penicillin v potassium) .... Take 1 tablet by mouth three times a day 13)  Tessalon Perles 100 Mg Caps (Benzonatate) .... Take 1 capsule by mouth three times a day 14)  Omeprazole 40 Mg Cpdr (Omeprazole) .... Take 1 capsule by mouth once a day  Other Orders: T-CBC w/Diff ([id number]) Gastroenterology Referral (GI) T-TSH [phone])  Patient Instructions: 1)  Please schedule a follow-up appointment in 4 months. 2)  It is important that you exercise regularly at least 20 minutes 5 times a week. If you develop chest pain, have severe difficulty breathing, or feel very tired , stop exercising immediately and seek medical attention. 3)  You need to lose weight. Consider a lower calorie diet and regular exercise.  4)  You are being treatwed for chronic bronchitis and sinusitis. 5)  BMP  prior to visit, ICD-9: 6)  Lipid Panel prior to visit, ICD-9:  fasting asap 7)  TSH prior to visit, ICD-9: 8)  CBC w/ Diff prior to visit, ICD-9: Prescriptions: NASONEX 50 MCG/ACT SUSP (MOMETASONE FUROATE) uad  #1 x 4   Entered by:   Worthy Keeler LPN   Authorized by:   Syliva Overman MD   Signed by:   Worthy Keeler LPN on 29/56/2130   Method used:   Electronically to        [facility] (retail)       [address]       [place], [place]  [address]       Ph: [phone]       Fax: [phone]   RxID:   [id number] CLONIDINE HCL 0.1 MG TABS (CLONIDINE HCL) Take one and one half tabs once daily  #45 Each x 4   Entered by:   Worthy Keeler LPN   Authorized by:   Syliva Overman MD   Signed by:   Worthy Keeler LPN on 63/87/5643   Method used:   Electronically to        [facility] (retail)       [address]       [place], [place]  [address]       Ph: [phone]       Fax: [phone]   RxID:   [id number] BENICAR 40 MG TABS (OLMESARTAN MEDOXOMIL) one tab by mouth qd  #30 Each x 4   Entered by:   Worthy Keeler LPN   Authorized by:   Syliva Overman MD   Signed by:   Worthy Keeler LPN on 62/83/1517   Method used:   Electronically to        [facility] (retail)       [address]       [place], [place]  [address]       Ph: [phone]       Fax: [phone]   RxID:   [id number] OMEPRAZOLE 40 MG CPDR (OMEPRAZOLE) Take 1 capsule by mouth once a day  #30 x 4   Entered and Authorized by:   Syliva Overman MD   Signed by:   Syliva Overman MD on 05/28/2009   Method used:   Electronically to  [facility] (retail)       [address]       [place], [place]  [address]       Ph: [phone]       Fax: [phone]   RxID:   [id number] TESSALON PERLES 100 MG CAPS (BENZONATATE) Take 1 capsule by mouth three times a day  #30 x 0   Entered and  Authorized by:   Syliva Overman MD   Signed by:   Syliva Overman MD on 05/28/2009   Method used:   Electronically to        [facility] (retail)       [address]       [place], [place]  [address]       Ph: [phone]       Fax: [phone]   RxID:   [id number] PENICILLIN V POTASSIUM 500 MG TABS (PENICILLIN V POTASSIUM) Take 1 tablet by mouth three times a day  #30 x 0   Entered and Authorized by:   Syliva Overman MD   Signed by:   Syliva Overman MD on 05/28/2009   Method used:   Electronically to        [facility] (retail)       [address]       [place], [place]  [address]       Ph: [phone]       Fax: [phone]   RxID:   [id number]

## 2010-05-28 NOTE — Progress Notes (Signed)
Summary: THYROID NODULES, NECK PAIN  THYROID NODULES, NECK PAIN   Imported By: Lind Guest 08/06/2009 14:11:34  _____________________________________________________________________  External Attachment:    Type:   Image     Comment:   External Document

## 2010-05-28 NOTE — Assessment & Plan Note (Signed)
Summary: office visit   Vital Signs:  Patient profile:   70 year old female Menstrual status:  postmenopausal Height:      65 inches Weight:      175.25 pounds BMI:     29.27 O2 Sat:      97 % Pulse rate:   71 / minute Pulse rhythm:   regular Resp:     16 per minute BP sitting:   150 / 90  (left arm)  Vitals Entered By: Everitt Amber LPN  Nutrition Counseling: Patient's BMI is greater than 25 and therefore counseled on weight management options. CC: has no energy for the past week now   CC:  has no energy for the past week now.  History of Present Illness: Reports  thatshe has been experiencing increased fatigue in the past 1 week.  Denies recent fever or chills. Denies sinus pressure, nasal congestion , ear pain or sore throat. Denies chest congestion, or cough productive of sputum. Denies chest pain, palpitations, PND, orthopnea or leg swelling. Reports abdominal pain, nausea,belching and RUQ discomfort. She has gallstones and has hears repeatedly the need for surgerey, still undecided, trying apple juice etc. She denies  vomitting, diarrhea or constipation. Denies change in bowel movements or bloody stool. Denies dysuria , frequency, incontinence or hesitancy. Denies  joint pain, swelling, or reduced mobility. Denies headaches, vertigo, seizures. Reports mild  depression, anxiety and  insomnia.Does not want nedication Denies  rash, lesions, or itch.     Current Medications (verified): 1)  Calcium-Vitamin D 500-125 Mg-Unit  Tabs (Calcium-Vitamin D) .... Take 1 Tablet By Mouth Two Times A Day 2)  Iron Cr 50 Mg  Cpcr (Ferrous Sulfate) .... Take 1 Tablet By Mouth Two Times A Day 3)  Aspirin 81 Mg  Tbec (Aspirin) .... Take 1 Tablet By Mouth Once A Day 4)  Omega-3 350 Mg Caps (Omega-3 Fatty Acids) .... Take One Tab By Mouth Once Daily 5)  Selenium 100 Mcg Tabs (Selenium) .... Take One Tab By Mouth Once Daily 6)  L-Carnitine 250 Mg Caps (Levocarnitine) .... Take 1-4 Caps By  Mouth Once Daily 7)  Benadryl-D Allergy/sinus 25-10 Mg Tabs (Diphenhydramine-Phenylephrine) .... Take One Tab By Mouth Every 4 Hours 8)  Vitamin C 500 Mg Tabs (Ascorbic Acid) .... Take 1 Tablet By Mouth Once A Day 9)  Benicar 40 Mg Tabs (Olmesartan Medoxomil) .... One Tab By Mouth Qd 10)  Nasonex 50 Mcg/act Susp (Mometasone Furoate) .... Uad 11)  Clonidine Hcl 0.1 Mg Tabs (Clonidine Hcl) .... Take One and One Half Tabs Once Daily 12)  Omeprazole 20 Mg Cpdr (Omeprazole) .... Take 1 Capsule By Mouth Two Times A Day  Allergies (verified): 1)  ! Sulfa  Review of Systems      See HPI General:  Complains of fatigue and malaise; denies chills and fever. Resp:  Complains of cough; denies sputum productive and wheezing; increased allergy symptoms in the past 1 week. GI:  Complains of abdominal pain; uncontrolled gERD symptoms , non compliant with meds. Psych:  Complains of depression; denies panic attacks, sense of great danger, suicidal thoughts/plans, and thoughts of violence; interestedin inc dose SSRI. Heme:  Denies abnormal bruising and bleeding. Allergy:  Denies hives or rash and itching eyes.  Physical Exam  General:  Well-developed,well-nourished,in no acute distress; alert,appropriate and cooperative throughout examination HEENT: No facial asymmetry,  EOMI, No sinus tenderness, TM's Clear, oropharynx  pink and moist.   Chest: Clear to auscultation bilaterally.  Breast; no masses, nipple  d/c or adenopathy. Tender left breast CVS: S1, S2, No murmurs, No S3.   Abd: Soft, Nontender.  VZ:DGLOVFIEP ROM spine,adequate in  hips, shoulders and knees.  Ext: No edema.   CNS: CN 2-12 intact, power tone and sensation normal throughout.   Skin: Intact, no visible lesions or rashes.  Psych: Good eye contact, normal affect.  Memory intact, not anxious or depressed appearing.    Impression & Recommendations:  Problem # 1:  FATIGUE (ICD-780.79) Assessment Improved  Problem # 2:  OVERWEIGHT  (ICD-278.02) Assessment: Unchanged  Ht: 65 (12/26/2009)   Wt: 175.25 (12/26/2009)   BMI: 29.27 (12/26/2009)  Problem # 3:  DEPRESSION (ICD-311) Assessment: Unchanged will commit to regular exercise and more community involvrmrnt, eg volunteer work  Problem # 4:  HYPERTENSION (ICD-401.9) Assessment: Unchanged  The following medications were removed from the medication list:    Clonidine Hcl 0.1 Mg Tabs (Clonidine hcl) .Marland Kitchen... Take one and one half tabs once daily Her updated medication list for this problem includes:    Benicar 40 Mg Tabs (Olmesartan medoxomil) ..... One tab by mouth qd    Clonidine Hcl 0.2 Mg Tabs (Clonidine hcl) .Marland Kitchen... Take 1 tab by mouth at bedtime  Orders: T-Basic Metabolic Panel [id number])  BP today: 150/90 Prior BP: 160/90 (10/04/2009)  Labs Reviewed: K+: 4.2 (06/15/2009) Creat: : 0.64 (06/15/2009)   Chol: 198 (06/15/2009)   HDL: 54 (06/15/2009)   LDL: 109 (06/15/2009)   TG: 175 (06/15/2009)  Problem # 5:  HYPERLIPIDEMIA (ICD-272.4) Assessment: Comment Only  Labs Reviewed: SGOT: 38 (02/17/2008)   SGPT: 33 (02/17/2008)   HDL:54 (06/15/2009), 49 (08/01/2008)  LDL:109 (06/15/2009), 120 (08/01/2008)  Chol:198 (06/15/2009), 191 (08/01/2008)  Trig:175 (06/15/2009), 109 (08/01/2008)  Complete Medication List: 1)  Calcium-vitamin D 500-125 Mg-unit Tabs (Calcium-vitamin d) .... Take 1 tablet by mouth two times a day 2)  Iron Cr 50 Mg Cpcr (Ferrous sulfate) .... Take 1 tablet by mouth two times a day 3)  Aspirin 81 Mg Tbec (Aspirin) .... Take 1 tablet by mouth once a day 4)  Omega-3 350 Mg Caps (Omega-3 fatty acids) .... Take one tab by mouth once daily 5)  Selenium 100 Mcg Tabs (Selenium) .... Take one tab by mouth once daily 6)  L-carnitine 250 Mg Caps (Levocarnitine) .... Take 1-4 caps by mouth once daily 7)  Benadryl-d Allergy/sinus 25-10 Mg Tabs (Diphenhydramine-phenylephrine) .... Take one tab by mouth every 4 hours 8)  Vitamin C 500 Mg Tabs (Ascorbic  acid) .... Take 1 tablet by mouth once a day 9)  Benicar 40 Mg Tabs (Olmesartan medoxomil) .... One tab by mouth qd 10)  Nasonex 50 Mcg/act Susp (Mometasone furoate) .... Uad 11)  Omeprazole 20 Mg Cpdr (Omeprazole) .... Take 1 capsule by mouth two times a day 12)  Clonidine Hcl 0.2 Mg Tabs (Clonidine hcl) .... Take 1 tab by mouth at bedtime  Other Orders: T-CBC w/Diff ([id number])  Patient Instructions: 1)  Please schedule a follow-up appointment in 2 months. 2)  It is important that you exercise regularly at least 20 minutes 5 times a week. If you develop chest pain, have severe difficulty breathing, or feel very tired , stop exercising immediately and seek medical attention. 3)  You need to lose weight. Consider a lower calorie diet and regular exercise.  4)  BMP prior to visit, ICD-9: 5)  CBC w/ Diff prior to visit, ICD-9:  fasting  in 2 months Prescriptions: CLONIDINE HCL 0.2 MG TABS (CLONIDINE HCL) Take 1 tab by mouth  at bedtime  #30 x 3   Entered and Authorized by:   Syliva Overman MD   Signed by:   Syliva Overman MD on 12/26/2009   Method used:   Printed then faxed to ...       [facility] (retail)       [address]       [place], [place]  [address]       Ph: [phone]       Fax: [phone]   RxID:   [id number]

## 2010-05-28 NOTE — Letter (Signed)
Summary: [place] OF THYROID   US OF THYROID   Imported By: Rudene Anda 06/20/2009 13:31:55  _____________________________________________________________________  External Attachment:    Type:   Image     Comment:   External Document

## 2010-05-30 NOTE — Progress Notes (Signed)
Summary: medicine  Phone Note Call from Patient   Summary of Call: [facility] Initial call taken by: Lind Guest,  April 17, 2010 2:48 PM    Prescriptions: BENICAR 40 MG TABS (OLMESARTAN MEDOXOMIL) one tab by mouth qd  #30 Each x 2   Entered by:   Adella Hare LPN   Authorized by:   Syliva Overman MD   Signed by:   Adella Hare LPN on 76/28/3151   Method used:   Electronically to        [facility] (retail)       [address]       [place], [place]  [address]       Ph: [phone]       Fax: [phone]   RxID:   [id number]

## 2010-06-10 ENCOUNTER — Telehealth (INDEPENDENT_AMBULATORY_CARE_PROVIDER_SITE_OTHER): Payer: Self-pay | Admitting: *Deleted

## 2010-06-19 NOTE — Progress Notes (Signed)
Summary: refill  Phone Note Call from Patient   Summary of Call: needs a refill on [facility]. [facility] [place] [phone] Initial call taken by: Rudene Anda,  June 10, 2010 10:51 AM    Prescriptions: CLONIDINE HCL 0.2 MG TABS (CLONIDINE HCL) Take 1 tab by mouth at bedtime  #45 Each x 2   Entered by:   Everitt Amber LPN   Authorized by:   Syliva Overman MD   Signed by:   Everitt Amber LPN on 29/56/2130   Method used:   Electronically to        [facility] (retail)       [address]       [place], [place]  [address]       Ph: [phone]       Fax: [phone]   RxID:   [id number]

## 2010-06-26 ENCOUNTER — Ambulatory Visit: Payer: Medicare Other | Admitting: Family Medicine

## 2010-06-26 ENCOUNTER — Encounter: Payer: Self-pay | Admitting: Family Medicine

## 2010-06-26 LAB — CONVERTED CEMR LAB
ALT: 36 units/L — ABNORMAL HIGH (ref 0–35)
Albumin: 4.2 g/dL (ref 3.5–5.2)
Alkaline Phosphatase: 224 units/L — ABNORMAL HIGH (ref 39–117)
Calcium: 9.4 mg/dL (ref 8.4–10.5)
Cholesterol: 226 mg/dL — ABNORMAL HIGH (ref 0–200)
Creatinine, Ser: 0.7 mg/dL (ref 0.40–1.20)
Glucose, Bld: 86 mg/dL (ref 70–99)
HCT: 35 % — ABNORMAL LOW (ref 36.0–46.0)
Lymphocytes Relative: 33 % (ref 12–46)
Lymphs Abs: 1.6 10*3/uL (ref 0.7–4.0)
MCV: 93.1 fL (ref 78.0–100.0)
Monocytes Absolute: 0.4 10*3/uL (ref 0.1–1.0)
Monocytes Relative: 7 % (ref 3–12)
Neutrophils Relative %: 55 % (ref 43–77)
RDW: 13.5 % (ref 11.5–15.5)
Sodium: 139 meq/L (ref 135–145)
TSH: 0.675 microintl units/mL (ref 0.350–4.500)
Total Bilirubin: 0.5 mg/dL (ref 0.3–1.2)

## 2010-06-27 ENCOUNTER — Encounter: Payer: Self-pay | Admitting: Family Medicine

## 2010-06-28 LAB — CONVERTED CEMR LAB: Retic Ct Pct: 0.7 % (ref 0.4–3.1)

## 2010-07-02 ENCOUNTER — Telehealth: Payer: Self-pay | Admitting: Family Medicine

## 2010-07-04 NOTE — Letter (Signed)
Summary: lab add on  lab add on   Imported By: Luann Bullins 06/27/2010 11:16:47  _____________________________________________________________________  External Attachment:    Type:   Image     Comment:   External Document

## 2010-07-09 NOTE — Progress Notes (Signed)
Summary: refill  Phone Note Call from Patient   Summary of Call: needs a fax sent over on benicar [phone] Initial call taken by: Rudene Anda,  July 02, 2010 2:46 PM    Prescriptions: BENICAR 40 MG TABS (OLMESARTAN MEDOXOMIL) one tab by mouth qd  #30 Each x 0   Entered by:   Adella Hare LPN   Authorized by:   Syliva Overman MD   Signed by:   Adella Hare LPN on 45/40/9811   Method used:   Electronically to        [facility] (retail)       [address]       [place], [place]  [address]       Ph: [phone]       Fax: [phone]   RxID:   [id number]

## 2010-07-15 ENCOUNTER — Encounter: Payer: Self-pay | Admitting: Family Medicine

## 2010-07-16 ENCOUNTER — Ambulatory Visit (INDEPENDENT_AMBULATORY_CARE_PROVIDER_SITE_OTHER): Payer: Medicare Other | Admitting: Family Medicine

## 2010-07-16 ENCOUNTER — Encounter: Payer: Self-pay | Admitting: Family Medicine

## 2010-07-16 VITALS — BP 140/84 | HR 87 | Resp 16 | Ht 65.3 in | Wt 174.1 lb

## 2010-07-16 DIAGNOSIS — G8929 Other chronic pain: Secondary | ICD-10-CM

## 2010-07-16 DIAGNOSIS — K759 Inflammatory liver disease, unspecified: Secondary | ICD-10-CM

## 2010-07-16 DIAGNOSIS — N949 Unspecified condition associated with female genital organs and menstrual cycle: Secondary | ICD-10-CM

## 2010-07-16 DIAGNOSIS — K8020 Calculus of gallbladder without cholecystitis without obstruction: Secondary | ICD-10-CM

## 2010-07-16 MED ORDER — OLMESARTAN MEDOXOMIL 40 MG PO TABS: 40.0000 mg | ORAL_TABLET | Freq: Every day | ORAL | Status: DC

## 2010-07-16 MED ORDER — OMEPRAZOLE 20 MG PO CPDR: 20.0000 mg | DELAYED_RELEASE_CAPSULE | Freq: Two times a day (BID) | ORAL | Status: DC

## 2010-07-16 NOTE — Patient Instructions (Signed)
CPE in 4 months.  Regular exercise, at least 30 minutes 5 days per week, and a diet rich in white meat, fresh fruit and vegetables is healthy. Please ensure you drink at least 64 ounces of water daily, and aim for at least 6 hours of sleep at night. Please keep your salt intake down.These will promote better blood pressures.  You are referred for 2 ultrasounds and to see GI.  Med changes as discussed

## 2010-07-17 ENCOUNTER — Other Ambulatory Visit: Payer: Self-pay | Admitting: Family Medicine

## 2010-07-17 DIAGNOSIS — G8929 Other chronic pain: Secondary | ICD-10-CM

## 2010-07-19 NOTE — Progress Notes (Signed)
  Subjective:    Patient ID: Yolanda Foley, female    DOB: 01/28/1941, 70 y.o.   MRN: [id number]  HPI The PT is here for follow up and re-evaluation of chronic medical conditions, medication management and review of recent lab and radiology data.  Preventive health is updated, specifically  Cancer screening, Osteoporosis screening and Immunization.   Questions or concerns regarding consultations or procedures which the PT has had in the interim are  addressed. The PT denies any adverse reactions to current medications since the last visit.  There are no new concerns.  She c/o chronic pelvic pain, and because of persitent gERD symptoms and elevated liver enzymes , she is referred to gI    Review of Systems  Constitutional: Negative for fever, chills, activity change, appetite change, fatigue and unexpected weight change.  HENT: Negative for hearing loss, ear pain, congestion, sore throat, rhinorrhea, sneezing, trouble swallowing, neck pain, neck stiffness, postnasal drip and sinus pressure.   Eyes: Negative for photophobia, pain, discharge, redness, itching and visual disturbance.  Respiratory: Negative for cough, chest tightness, shortness of breath and wheezing.   Cardiovascular: Negative for chest pain, palpitations and leg swelling.  Gastrointestinal: Negative for nausea, vomiting, abdominal pain, diarrhea, constipation and blood in stool.  Genitourinary: Negative for dysuria, frequency, hematuria and flank pain.  Musculoskeletal: Negative for myalgias, back pain, joint swelling, arthralgias and gait problem.  Skin: Negative for rash and wound.  Neurological: Negative for dizziness, tremors, seizures, speech difficulty, weakness, numbness and headaches.  Hematological: Negative for adenopathy. Does not bruise/bleed easily.  Psychiatric/Behavioral: Negative for suicidal ideas, hallucinations, behavioral problems, confusion, sleep disturbance and decreased concentration. The patient is not  nervous/anxious and is not hyperactive.        Objective:   Physical Exam  Constitutional: She is oriented to person, place, and time. She appears well-developed and well-nourished.  HENT:  Head: Normocephalic.  Right Ear: External ear normal.  Left Ear: External ear normal.  Nose: Nose normal.  Mouth/Throat: Oropharynx is clear and moist.  Eyes: Conjunctivae and EOM are normal. Right eye exhibits no discharge. Left eye exhibits no discharge. No scleral icterus.  Neck: Normal range of motion. Neck supple. No JVD present. No tracheal deviation present. No thyromegaly present.  Cardiovascular: Normal rate, regular rhythm and intact distal pulses.   No murmur heard. Pulmonary/Chest: Effort normal and breath sounds normal. No respiratory distress. She has no wheezes. She has no rales.  Abdominal: Soft. Bowel sounds are normal. There is no tenderness.  Musculoskeletal: Normal range of motion. She exhibits no edema.  Lymphadenopathy:    She has no cervical adenopathy.  Neurological: She is alert and oriented to person, place, and time. No cranial nerve deficit. Coordination normal.  Skin: Skin is warm. No rash noted. No erythema.  Psychiatric: She has a normal mood and affect. Her behavior is normal. Judgment and thought content normal.          Assessment & Plan:  1. Hypertension: uncontrolled, med to remain unchanged, attention to diet and exercise stressed. 2. Pelvic pain: deteriorated, ultrasound to evaluate and exam and pap at next visit. 3.Hyperlipidemia: Hyperlipidemia:Low fat diet discussed and encouraged.  Pt non compliant with medication. GERD: uncontrolled, with elevated liver enzymes, gI evaluation

## 2010-07-22 ENCOUNTER — Telehealth: Payer: Self-pay | Admitting: Family Medicine

## 2010-07-25 ENCOUNTER — Encounter: Payer: Self-pay | Admitting: Family Medicine

## 2010-08-05 ENCOUNTER — Encounter: Payer: Self-pay | Admitting: Family Medicine

## 2010-08-05 DIAGNOSIS — K8020 Calculus of gallbladder without cholecystitis without obstruction: Secondary | ICD-10-CM | POA: Insufficient documentation

## 2010-08-06 ENCOUNTER — Encounter: Payer: Self-pay | Admitting: Family Medicine

## 2010-09-04 ENCOUNTER — Ambulatory Visit (INDEPENDENT_AMBULATORY_CARE_PROVIDER_SITE_OTHER): Payer: Medicare Other | Admitting: Internal Medicine

## 2010-09-10 NOTE — Assessment & Plan Note (Signed)
[facility]                          [facility] NOTE   [name], [name]                         MRN:          [id number]  DATE:09/07/2007                            DOB:          Jun 26, 1940    PRIMARY CARDIOLOGIST:  Learta Codding, MD. (new).   REASON FOR CONSULTATION:  Palpitations.   HISTORY:  [name] is a very pleasant 70 year old female with no prior  cardiac history, now referred for evaluation of recurrent tachy  palpitations.  The patient's cardiac risk factors are notable for  hypertension and dyslipidemia.  She denies any history of diabetes  mellitus, tobacco smoking or family history of premature coronary artery  disease.   The patient presents with complaint of recurrent palpitations over the  preceding 6-8 months.  These are unpredictable in onset and can last  several minutes in duration.  She notes some associated shortness of  breath with this, but no near syncope/frank syncope.  She also denies  any associated chest discomfort.  The patient has never had any prior  cardiac diagnostic testing.  She reports having had recent blood work,  including a normal thyroid profile.   The patient does admit to having used caffeinated beverages in the past.  However, she has stopped drinking her usual several cups of caffeinated  tea every day, switching over to decaffeinated beverages.  She also,  however, admits to eating dark chocolate on occasion.   DIAGNOSTICS:  Electrocardiogram in our office reveals NSR at 72 bpm with  normal axis and no ischemic changes.   ALLERGIES:  SULFA.   CURRENT MEDICATIONS:  1. Benicar 40 daily.  2. Aspirin 81 daily.  3. Omeprazole.  4. Fluoxetine.  5. Omega 3 fish oil   PAST MEDICAL HISTORY:  1. Hypertension.  2. Dyslipidemia.  3. Anemia.  4. GERD.   PAST SURGICAL HISTORY:  1. Status post cyst removal.  2. Tubal ligation.  3. Appendectomy.   REVIEW OF SYSTEMS:  Denies history of  exertional angina pectoris or  dyspnea.  Denies any history of thyroid disease.  Otherwise as noted per  HPI, remaining systems negative.   SOCIAL HISTORY:  Married, 4 children.  Retired.  Has never smoked  tobacco or drank alcohol.   FAMILY HISTORY:  Father deceased at age 45 of end-stage renal disease,  congestive heart failure.  Mother deceased at age 33.   PHYSICAL EXAMINATION:  VITAL SIGNS:  Blood pressure 124/75, pulse 81  regular, weight 179.6.  GENERAL:  A 70 year old female, sitting upright in no distress.  HEENT:  Normocephalic, atraumatic.  NECK:  Palpable carotid pulse without bruits; no JVD.  LUNGS:  Clear to auscultation in all fields.  HEART:  Regular rate and rhythm (S1 and S2 ), no significant murmurs.  No rubs or gallops.  ABDOMEN:  Soft, nontender, intact bowel sounds.  EXTREMITIES:  Palpable distal pulses without edema.  NEURO:  No focal deficit.   IMPRESSION:  1. Recurrent tachy palpitations.      a.     Mild associated dyspnea.  2. Hypertension.  3. Dyslipidemia.  4. Chronic anemia.   PLAN:  1. Schedule 2-D echocardiogram for assessment of LVF and rule out of      underlying structural abnormalities.  2. CardioNet monitoring to rule out underlying dysrhythmia.  3. Patient advised to eliminate all caffeinated beverages, as much as      possible.  4. Schedule return clinic followup with myself and Dr. Andee Lineman in 2      months for review of study results and further recommendations.      Gene Serpe, PA-C  Electronically Signed      Learta Codding, MD,FACC  Electronically Signed   GS/MedQ  DD: 09/07/2007  DT: 09/07/2007  Job #: [id number]   cc:   Milus Mallick. Lodema Hong, M.D.

## 2010-09-10 NOTE — Assessment & Plan Note (Signed)
[facility]                          [facility]ICE NOTE   MOON, BUDDE                         MRN:          [id number]  DATE:11/19/2007                            DOB:          October 05, 1940    PRIMARY CARDIOLOGIST:  Learta Codding, MD, [facility].   REASON FOR VISIT:  Scheduled followup.  Please refer to my initial  consultation note of Sep 07, 2007, for full details.   At that time, Ms. Cobaugh was referred to [place] as a new consult for  evaluation of recurrent tachy palpitations.  She presented with no prior  cardiac history.  Cardiac risk factors notable only for hypertension and  dyslipidemia.   We proceeded with an evaluation consisting of both the 2-D echo and  CardioNet monitoring.  Echocardiogram was normal (EF 60-65%), with no  significant valvular abnormalities.   CardioNet monitoring did not suggest any definite dysrhythmias.  There  was, however, a very brief run (less than 10 seconds) of narrow complex  tachycardia at approximately 150 bpm.  This was reviewed by both myself  and Dr. 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   Clinically, Ms. Shimko reports a marked decrease in both the frequency and  intensity of her palpitations.  She attributes this to having  significantly cut back on her caffeinated beverage intake.      Gene Serpe, PA-C       Learta Codding, MD,FACC    GS/MedQ  DD: 11/19/2007  DT: 11/20/2007  Job #: [id number]

## 2010-09-13 NOTE — Consult Note (Signed)
NAMEMAELA, TAKEDA NO.:  [id number]   MEDICAL RECORD NO.:  [id number]          PATIENT TYPE:  EMS   LOCATION:  ED                            FACILITY:  [facility]   PHYSICIAN:  Tilda Burrow, M.D. DATE OF BIRTH:  December 25, 1940   DATE OF CONSULTATION:  DATE OF DISCHARGE:                                 CONSULTATION   ADMITTING DIAGNOSES:  Pelvic pain of 5 days' duration.   HISTORY OF PRESENT ILLNESS:  This 70 year old female postmenopausal  times years is seen in the emergency room complaining of lower abdominal  pain on the left side.  She has had regular bowel movements q. 1-2 times  per day as per her normal routine.  No fever.  The lower abdominal pain  has been on the left side.  GYN is consulted after internal medicine has  declined observation of the patient.  Question is whether she has an  inflammatory process or degeneration of fibroids which are long-standing  and known and previously stable.  The patient is on no hormone therapy.  Has had no bleeding.  Has had regular Pap smears 2 years ago.   PAST MEDICAL HISTORY:  Benign.   SURGICAL HISTORY:  Negative.   She was sent to the office courtesy of Dr. Lodema Hong for evaluation.  She  has had evaluation including an abdominal CT and ultrasound, both of  which confirmed a large pelvic mass, 7 x 9 x 9 cm with central  calcifications consistent on total exam with uterine fibroids.  She has  a longstanding history of stable uterine fibroids.  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  The ovaries are visible on various studies and  are benign in appearance.  Urinalysis shows a small amount of hemoglobin  and ketones, trace protein, negative nitrates, and a small amount of  leukocyte esterase.  White count is 13,500, 91 neutrophils, 8  lymphocytes.  Electrolytes:  Potassium 3.2, BUN 9, creatinine 0.9.  History and review of systems, obtained by  Dr. Rubin Payor, are reviewed  and documented in his notes,  specifically, no ascites, no  lymphadenopathy.  There is a small amount of inflammatory changes  surrounding the central mass.   EXAM:  ABDOMEN:  Bowel sounds active, normal pitch.  Tenderness is in  the lower quadrant on the left just below the umbilicus.  This  corresponds to the fibroids on CT.  Speculum exam shows normal support.  Cervix is nulliparous and nonpurulent.  GC and Chlamydia obtained.  Uterus is anterior, very high supported in the abdomen and pelvis.  The  tenderness is noted most when you are pressing very anteriorly and  capture the uterus.   IMPRESSION:  Longstanding uterine fibroids in a postmenopausal  individual, fibroid degeneration versus surrounding inflammatory process  such as diverticular disease.   PLAN:  We will attempt outpatient management as per the patient's  preferences.  We will treat with Levaquin 500 mg daily x7 days as well  as Darvocet for pain.  We will give  an additional few tablets of  Dilaudid 2 mg for severe pain.  Followup in 72 hours in our office or  earlier p.r.n. deterioration of condition.      Tilda Burrow, M.D.  Electronically Signed     JVF/MEDQ  D:  03/24/2006  T:  03/24/2006  Job:  [id number]   cc:   [facility]   Dr. Lodema Hong

## 2010-11-05 ENCOUNTER — Encounter: Payer: Self-pay | Admitting: Family Medicine

## 2010-11-07 ENCOUNTER — Encounter (INDEPENDENT_AMBULATORY_CARE_PROVIDER_SITE_OTHER): Payer: Self-pay

## 2010-11-11 ENCOUNTER — Encounter: Payer: Self-pay | Admitting: Family Medicine

## 2010-11-19 ENCOUNTER — Encounter: Payer: Self-pay | Admitting: Family Medicine

## 2010-11-19 ENCOUNTER — Encounter: Payer: PRIVATE HEALTH INSURANCE | Admitting: Family Medicine

## 2010-11-19 ENCOUNTER — Ambulatory Visit (INDEPENDENT_AMBULATORY_CARE_PROVIDER_SITE_OTHER): Payer: Medicare Other | Admitting: Internal Medicine

## 2010-11-21 ENCOUNTER — Ambulatory Visit (INDEPENDENT_AMBULATORY_CARE_PROVIDER_SITE_OTHER): Payer: Medicare Other | Admitting: Otolaryngology

## 2010-11-21 DIAGNOSIS — D449 Neoplasm of uncertain behavior of unspecified endocrine gland: Secondary | ICD-10-CM

## 2010-11-21 DIAGNOSIS — K2100 Gastro-esophageal reflux disease with esophagitis, without bleeding: Secondary | ICD-10-CM

## 2010-11-21 DIAGNOSIS — R490 Dysphonia: Secondary | ICD-10-CM

## 2010-11-25 ENCOUNTER — Encounter (INDEPENDENT_AMBULATORY_CARE_PROVIDER_SITE_OTHER): Payer: Self-pay | Admitting: Internal Medicine

## 2010-11-25 ENCOUNTER — Ambulatory Visit (INDEPENDENT_AMBULATORY_CARE_PROVIDER_SITE_OTHER): Payer: Medicare Other | Admitting: Internal Medicine

## 2010-11-25 VITALS — BP 120/68 | HR 66 | Temp 98.8°F | Ht 65.0 in | Wt 174.0 lb

## 2010-11-25 DIAGNOSIS — K7689 Other specified diseases of liver: Secondary | ICD-10-CM

## 2010-11-25 DIAGNOSIS — K760 Fatty (change of) liver, not elsewhere classified: Secondary | ICD-10-CM

## 2010-11-25 NOTE — Patient Instructions (Signed)
Exercise at least 3 times a week. Blood in 4 weeks. OV in 6 months.

## 2010-11-26 NOTE — Progress Notes (Signed)
Presenting complaint; followup for elevated transaminases. Subjective; patient is a 70 year old Afro-American female patient of Dr. Anthony Sar who is here for a scheduled visit she was last seen on 09/04/2010 are mildly elevated transaminases she also had elevated serum ferritin. All of her labs are summarized below. Ultrasound was normal except single gallstone felt to be incidental finding. She denies abdominal pain nausea vomiting or pruritus. She is using Gaviscon which seemed to help with her heartburn in addition to taking omeprazole her bowels move regularly and she denies melena or rectal bleeding. She brought pictures of her last colonoscopy from 2011 and I went over the findings with her. Her family history is negative for chronic liver disease. Scheduled Meds:  Current outpatient prescriptions:aspirin (ASPIRIN LOW DOSE) 81 MG EC tablet, Take 81 mg by mouth as needed. Take one tablet by mouth once a day, Disp: , Rfl: ;  Bioflavonoid Products (GRAPE SEED PO), Take by mouth. One a day , Disp: , Rfl: ;  Calcium Carbonate-Vit D-Min (CALTRATE 600+D PLUS) 600-400 MG-UNIT per tablet, Take 1 tablet by mouth daily.  , Disp: , Rfl:  cloNIDine (CATAPRES) 0.2 MG tablet, Take 0.2 mg by mouth. Take 1 tablet by mouth at bedtime , Disp: , Rfl: ;  Diphenhydramine-Phenylephrine (BENADRYL-D ALLERGY/SINUS) 25-10 MG TABS, Take by mouth as needed. Take one tablet by mouth every 4 hours , Disp: , Rfl: ;  LevOCARNitine (L-CARNITINE) 250 MG CAPS, Take by mouth. Take 1-2 caps a day, Disp: , Rfl: ;  Misc Natural Products (COLON CARE PO), Take by mouth. 1 a day , Disp: , Rfl:  MISC NATURAL PRODUCTS PO, Take by mouth. Ultimate BP - Patient takes 1 a day , Disp: , Rfl: ;  mometasone (NASONEX) 50 MCG/ACT nasal spray, Place 2 sprays into the nose as needed. Use as directed, Disp: , Rfl: ;  Multiple Vitamin (MULTIVITAMIN PO), Take by mouth daily.  , Disp: , Rfl: ;  Omega-3 350 MG CAPS, Take by mouth. Take one tablet by mouth once  daily , Disp: , Rfl:  omeprazole (PRILOSEC) 20 MG capsule, Take 1 capsule (20 mg total) by mouth 2 (two) times daily., Disp: 60 capsule, Rfl: 4;  Cholecalciferol (VITAMIN D) 2000 UNITS CAPS, Take by mouth. One tab daily , Disp: , Rfl: ;  Multiple Minerals (CALCIUM/MAGNESIUM/ZINC PO), Take by mouth daily.  , Disp: , Rfl: ;  olmesartan (BENICAR) 40 MG tablet, Take 1 tablet (40 mg total) by mouth daily. One tab by mouth qd , Disp: 30 tablet, Rfl: 3 Objective; BP 120/68  Pulse 66  Temp(Src) 98.8 F (37.1 C) (Oral)  Ht 5\' 5"  (1.651 m)  Wt 174 lb (78.926 kg)  BMI 28.96 kg/m2 Conjunctiva is pink sclera is nonicteric oropharyngeal mucosa is normal. She has upper dentures. No neck masses or thyromegaly noted. She does not have spider angiomata. Her abdomen is symmetrical soft and nontender without organomegaly or masses. No peripheral edema noted. Lab data; From 10/11/2010 Sedimentation rate is 66. Serum ferritin 736 (was 918 on 06/27/2010) ANA positive; ANA titer negative;  bilirubin 0.5; AP 246; SGOT 44; SGPT 35; albumin 4.0 Assessment; Mildly elevated transaminases presumed to be due to fatty liver although ultrasound failed to show echogenic liver.; Trend is one of improvement; she does not have any stigmata of chronic liver disease; we will therefore continue to monitor her and should also rule out early PBC. She has mildly elevated sedimentation rate as being nonspecific; clinical course not consistent with autoimmune hepatitis.  Plan Patient  advised to exercise regularly. He have repeat LFTs and AMA in 4 weeks. Resuming LFTs are stable or improved and AMA is negative will see her back in 6 months.

## 2010-11-28 ENCOUNTER — Encounter (INDEPENDENT_AMBULATORY_CARE_PROVIDER_SITE_OTHER): Payer: Self-pay | Admitting: *Deleted

## 2010-11-28 DIAGNOSIS — R945 Abnormal results of liver function studies: Secondary | ICD-10-CM

## 2010-11-28 NOTE — Telephone Encounter (Signed)
This encounter was created in error - please disregard.

## 2010-12-04 ENCOUNTER — Encounter (INDEPENDENT_AMBULATORY_CARE_PROVIDER_SITE_OTHER): Payer: Self-pay | Admitting: *Deleted

## 2010-12-09 ENCOUNTER — Encounter (INDEPENDENT_AMBULATORY_CARE_PROVIDER_SITE_OTHER): Payer: Self-pay | Admitting: *Deleted

## 2010-12-09 NOTE — Progress Notes (Signed)
This encounter was created in error - please disregard.

## 2010-12-09 NOTE — Telephone Encounter (Signed)
This encounter was created in error - please disregard.

## 2010-12-09 NOTE — Progress Notes (Signed)
Addended by: Shona Needles on: 12/09/2010 01:56 PM   Modules accepted: Level of Service, SmartSet

## 2010-12-17 ENCOUNTER — Encounter: Payer: Self-pay | Admitting: Family Medicine

## 2010-12-18 ENCOUNTER — Ambulatory Visit (INDEPENDENT_AMBULATORY_CARE_PROVIDER_SITE_OTHER): Payer: Medicare Other | Admitting: Family Medicine

## 2010-12-18 ENCOUNTER — Other Ambulatory Visit (HOSPITAL_COMMUNITY)
Admission: RE | Admit: 2010-12-18 | Discharge: 2010-12-18 | Disposition: A | Discharge status: Home / Self Care | Payer: Medicare Other | Source: Ambulatory Visit | Attending: Family Medicine | Admitting: Family Medicine

## 2010-12-18 ENCOUNTER — Encounter: Payer: Self-pay | Admitting: Family Medicine

## 2010-12-18 VITALS — BP 160/100 | HR 70 | Resp 16 | Ht 65.0 in | Wt 173.1 lb

## 2010-12-18 DIAGNOSIS — E785 Hyperlipidemia, unspecified: Secondary | ICD-10-CM

## 2010-12-18 DIAGNOSIS — Z1211 Encounter for screening for malignant neoplasm of colon: Secondary | ICD-10-CM

## 2010-12-18 DIAGNOSIS — R7301 Impaired fasting glucose: Secondary | ICD-10-CM

## 2010-12-18 DIAGNOSIS — Z0000 Encounter for general adult medical examination without abnormal findings: Secondary | ICD-10-CM

## 2010-12-18 DIAGNOSIS — I10 Essential (primary) hypertension: Secondary | ICD-10-CM

## 2010-12-18 DIAGNOSIS — Z124 Encounter for screening for malignant neoplasm of cervix: Secondary | ICD-10-CM | POA: Insufficient documentation

## 2010-12-18 DIAGNOSIS — R498 Other voice and resonance disorders: Secondary | ICD-10-CM

## 2010-12-18 LAB — POC HEMOCCULT BLD/STL (OFFICE/1-CARD/DIAGNOSTIC): Fecal Occult Blood, POC: POSITIVE

## 2010-12-18 MED ORDER — OLMESARTAN MEDOXOMIL 40 MG PO TABS: 40.0000 mg | ORAL_TABLET | Freq: Every day | ORAL | Status: DC

## 2010-12-18 MED ORDER — AMLODIPINE BESYLATE 2.5 MG PO TABS: 2.5000 mg | ORAL_TABLET | Freq: Every day | ORAL | Status: DC

## 2010-12-18 NOTE — Patient Instructions (Signed)
F/u in 6 weeks.  New medication for blood pressure.  Please stop clonidine , and start amlodipine, this should be  Better tolerated.   Fasting Lipid and HBA1C with labwork pls and chem 7   I will check on your colonscopy and also the pelvic US

## 2010-12-24 ENCOUNTER — Other Ambulatory Visit (INDEPENDENT_AMBULATORY_CARE_PROVIDER_SITE_OTHER): Payer: Self-pay | Admitting: Internal Medicine

## 2010-12-25 LAB — BASIC METABOLIC PANEL
Calcium: 9.5 mg/dL (ref 8.4–10.5)
Sodium: 139 mEq/L (ref 135–145)

## 2010-12-25 LAB — LIPID PANEL
LDL Cholesterol: 142 mg/dL — ABNORMAL HIGH (ref 0–99)
VLDL: 21 mg/dL (ref 0–40)

## 2010-12-25 LAB — HEMOGLOBIN A1C
Hgb A1c MFr Bld: 5.9 % — ABNORMAL HIGH (ref <5.7)
Mean Plasma Glucose: 123 mg/dL — ABNORMAL HIGH (ref <117)

## 2010-12-25 LAB — HEPATIC FUNCTION PANEL
Bilirubin, Direct: 0.1 mg/dL (ref 0.0–0.3)
Indirect Bilirubin: 0.5 mg/dL (ref 0.0–0.9)

## 2010-12-30 NOTE — Progress Notes (Signed)
  Subjective:    Patient ID: Yolanda Foley, female    DOB: 17-Nov-1940, 70 y.o.   MRN: [id number]  HPI The PT is here for annual exam and re-evaluation of chronic medical conditions, medication management and review of any available recent lab and radiology data.  Preventive health is updated, specifically  Cancer screening and Immunization.   Questions or concerns regarding consultations or procedures which the PT has had in the interim are  addressed. The PT denies any adverse reactions to current medications since the last visit.  There are no new concerns.  There are no specific complaints       Review of Systems Denies recent fever or chills. Denies sinus pressure, nasal congestion, ear pain or sore throat. Denies chest congestion, productive cough or wheezing. Denies chest pains, palpitations and leg swelling Denies abdominal pain, nausea, vomiting,diarrhea or constipation.   Denies dysuria, frequency, hesitancy or incontinence. Denies joint pain, swelling and limitation in mobility. Denies headaches, seizures, numbness, or tingling. Denies depression, anxiety or insomnia. Denies skin break down or rash.       Objective:   Physical Exam Pleasant well nourished female, alert and oriented x 3, in no cardio-pulmonary distress. Afebrile. HEENT No facial trauma or asymetry. Sinuses non tender.  EOMI, PERTL, fundoscopic exam is normal, no hemorhage or exudate.  External ears normal, tympanic membranes clear. Oropharynx moist, no exudate, good dentition. Neck: supple, no adenopathy,JVD or thyromegaly.No bruits.  Chest: Clear to ascultation bilaterally.No crackles or wheezes. Non tender to palpation  Breast: No asymetry,no masses. No nipple discharge or inversion. No axillary or supraclavicular adenopathy  Cardiovascular system; Heart sounds normal,  S1 and  S2 ,no S3.  No murmur, or thrill. Apical beat not displaced Peripheral pulses normal.  Abdomen: Soft, non  tender, no organomegaly or masses. No bruits. Bowel sounds normal. No guarding, tenderness or rebound.  Rectal:  No mass. Guaiac negative stool.  GU: External genitalia normal. No lesions. Vaginal canal normal.No discharge. Uterus normal size, no adnexal masses, no cervical motion or adnexal tenderness.  Musculoskeletal exam: Decreased  ROM of spine, hips , shoulders and knees. No deformity ,swelling or crepitus noted. No muscle wasting or atrophy.   Neurologic: Cranial nerves 2 to 12 intact. Power, tone ,sensation and reflexes normal throughout. No disturbance in gait. No tremor.  Skin: Intact, no ulceration, erythema , scaling or rash noted. Pigmentation normal throughout  Psych; Normal mood and affect. Judgement and concentration normal        Assessment & Plan:

## 2010-12-30 NOTE — Assessment & Plan Note (Signed)
Minor improvement, low fat diet counseling done, pt rather not take med

## 2010-12-30 NOTE — Assessment & Plan Note (Signed)
Improved with regular PPI use, is being followed by ENT

## 2010-12-30 NOTE — Assessment & Plan Note (Signed)
Medication compliance addressed. Commitment to regular exercise and healthy  food choices, with portion control discussed. DASH diet and low fat diet discussed and literature offered. Changes in medication made at this visit.  

## 2011-01-10 ENCOUNTER — Encounter (INDEPENDENT_AMBULATORY_CARE_PROVIDER_SITE_OTHER): Payer: Self-pay | Admitting: Internal Medicine

## 2011-01-10 ENCOUNTER — Other Ambulatory Visit (INDEPENDENT_AMBULATORY_CARE_PROVIDER_SITE_OTHER): Payer: Self-pay | Admitting: Internal Medicine

## 2011-01-10 DIAGNOSIS — K743 Primary biliary cirrhosis: Secondary | ICD-10-CM

## 2011-01-10 MED ORDER — URSODIOL 250 MG PO TABS: 500.0000 mg | ORAL_TABLET | Freq: Two times a day (BID) | ORAL | Status: AC

## 2011-01-15 ENCOUNTER — Telehealth (INDEPENDENT_AMBULATORY_CARE_PROVIDER_SITE_OTHER): Payer: Self-pay | Admitting: *Deleted

## 2011-01-15 NOTE — Telephone Encounter (Signed)
Per Dr. Karilyn Cota the patient will need a Hepatic Profile done in 2 months prior to her office visit . The patient will be sent a reminder and lab order will be sent to [facility].

## 2011-01-30 ENCOUNTER — Encounter: Payer: Self-pay | Admitting: Family Medicine

## 2011-02-05 ENCOUNTER — Ambulatory Visit (INDEPENDENT_AMBULATORY_CARE_PROVIDER_SITE_OTHER): Payer: Medicare Other | Admitting: Family Medicine

## 2011-02-05 ENCOUNTER — Encounter: Payer: Self-pay | Admitting: Family Medicine

## 2011-02-05 VITALS — BP 122/84 | HR 79 | Resp 16 | Ht 65.0 in | Wt 174.4 lb

## 2011-02-05 DIAGNOSIS — G4700 Insomnia, unspecified: Secondary | ICD-10-CM

## 2011-02-05 DIAGNOSIS — K8020 Calculus of gallbladder without cholecystitis without obstruction: Secondary | ICD-10-CM

## 2011-02-05 DIAGNOSIS — I10 Essential (primary) hypertension: Secondary | ICD-10-CM

## 2011-02-05 DIAGNOSIS — Z23 Encounter for immunization: Secondary | ICD-10-CM

## 2011-02-05 DIAGNOSIS — E785 Hyperlipidemia, unspecified: Secondary | ICD-10-CM

## 2011-02-05 MED ORDER — OLMESARTAN MEDOXOMIL 20 MG PO TABS: 20.0000 mg | ORAL_TABLET | Freq: Every day | ORAL | Status: DC

## 2011-02-05 NOTE — Patient Instructions (Addendum)
F/u in 2 months  Pls start benicar 20mg  one daily, I am sending to your pharmacy. ' Pls stop otc products.  Pls consider gall bladder surgery due to gall stones  Flu vaccine today.  ONE multivitamin  One aspirin 81 mg tab to reduce stroke risk  Calcium 1200mg  daily with vit D 800IU daily recommended

## 2011-02-07 NOTE — Telephone Encounter (Signed)
I am closing this encounter since it is from 07/22/10

## 2011-02-09 DIAGNOSIS — G4700 Insomnia, unspecified: Secondary | ICD-10-CM | POA: Insufficient documentation

## 2011-02-09 NOTE — Assessment & Plan Note (Addendum)
Controlled, no change in medication  

## 2011-02-09 NOTE — Assessment & Plan Note (Signed)
Encouraged pt to seriously consider cholecystectomy

## 2011-02-09 NOTE — Assessment & Plan Note (Signed)
Hyperlipidemia:Low fat diet discussed and encouraged.   

## 2011-02-09 NOTE — Assessment & Plan Note (Addendum)
Discussed sleep hygiene. Pt to carry out a change in sleep habits with her spouse.

## 2011-02-09 NOTE — Progress Notes (Signed)
  Subjective:    Patient ID: Yolanda Foley, female    DOB: 02-13-1941, 70 y.o.   MRN: [id number]  HPI The PT is here for follow up and re-evaluation of chronic medical conditions, medication management and review of any available recent lab and radiology data.  Preventive health is updated, specifically  Cancer screening and Immunization.   Questions or concerns regarding consultations or procedures which the PT has had in the interim are  addressed. The PT denies any adverse reactions to current medications since the last visit.  C/o difficulty with sleep, however the main problem is with poor habits by her spouse.      Review of Systems See HPI Denies recent fever or chills. Denies sinus pressure, nasal congestion, ear pain or sore throat. Denies chest congestion, productive cough or wheezing. Denies chest pains, palpitations and leg swelling Denies abdominal pain, nausea, vomiting,diarrhea or constipation.   Denies dysuria, frequency, hesitancy or incontinence. Denies joint pain, swelling and limitation in mobility. Denies headaches, seizures, numbness, or tingling. Denies depression or anxiety . Denies skin break down or rash.        Objective:   Physical Exam Patient alert and oriented and in no cardiopulmonary distress.  HEENT: No facial asymmetry, EOMI, no sinus tenderness,  oropharynx pink and moist.  Neck supple no adenopathy.  Chest: Clear to auscultation bilaterally.  CVS: S1, S2 no murmurs, no S3.  ABD: Soft non tender. Bowel sounds normal.  Ext: No edema  MS: Adequate ROM spine, shoulders, hips and knees.  Skin: Intact, no ulcerations or rash noted.  Psych: Good eye contact, normal affect. Memory intact not anxious or depressed appearing.  CNS: CN 2-12 intact, power, tone and sensation normal throughout.        Assessment & Plan:

## 2011-03-05 ENCOUNTER — Encounter (INDEPENDENT_AMBULATORY_CARE_PROVIDER_SITE_OTHER): Payer: Self-pay | Admitting: *Deleted

## 2011-03-28 ENCOUNTER — Other Ambulatory Visit (INDEPENDENT_AMBULATORY_CARE_PROVIDER_SITE_OTHER): Payer: Self-pay | Admitting: Internal Medicine

## 2011-03-29 LAB — HEPATIC FUNCTION PANEL
Albumin: 4.1 g/dL (ref 3.5–5.2)
Alkaline Phosphatase: 171 U/L — ABNORMAL HIGH (ref 39–117)
Total Bilirubin: 0.3 mg/dL (ref 0.3–1.2)

## 2011-04-07 ENCOUNTER — Telehealth (INDEPENDENT_AMBULATORY_CARE_PROVIDER_SITE_OTHER): Payer: Self-pay | Admitting: *Deleted

## 2011-04-07 NOTE — Telephone Encounter (Signed)
Per Dr. Karilyn Cota the patient will need LFT in 3 months with an office visit. Lab is noted for 07-06-11. Forwarded to Dewayne Hatch to fax lab results and to Lupita Leash to make office appointment.

## 2011-05-08 ENCOUNTER — Ambulatory Visit (INDEPENDENT_AMBULATORY_CARE_PROVIDER_SITE_OTHER): Payer: Medicare Other | Admitting: Otolaryngology

## 2011-05-08 DIAGNOSIS — K219 Gastro-esophageal reflux disease without esophagitis: Secondary | ICD-10-CM | POA: Diagnosis not present

## 2011-05-08 DIAGNOSIS — D449 Neoplasm of uncertain behavior of unspecified endocrine gland: Secondary | ICD-10-CM

## 2011-05-08 DIAGNOSIS — R490 Dysphonia: Secondary | ICD-10-CM

## 2011-05-15 DIAGNOSIS — E042 Nontoxic multinodular goiter: Secondary | ICD-10-CM | POA: Diagnosis not present

## 2011-05-29 ENCOUNTER — Ambulatory Visit (INDEPENDENT_AMBULATORY_CARE_PROVIDER_SITE_OTHER): Payer: Medicare Other | Admitting: Otolaryngology

## 2011-05-29 DIAGNOSIS — D449 Neoplasm of uncertain behavior of unspecified endocrine gland: Secondary | ICD-10-CM | POA: Diagnosis not present

## 2011-05-29 DIAGNOSIS — R490 Dysphonia: Secondary | ICD-10-CM | POA: Diagnosis not present

## 2011-07-08 ENCOUNTER — Ambulatory Visit (INDEPENDENT_AMBULATORY_CARE_PROVIDER_SITE_OTHER): Payer: Medicare Other | Admitting: Internal Medicine

## 2011-07-08 ENCOUNTER — Encounter (INDEPENDENT_AMBULATORY_CARE_PROVIDER_SITE_OTHER): Payer: Self-pay | Admitting: Internal Medicine

## 2011-07-08 VITALS — BP 132/70 | HR 72 | Temp 98.3°F | Ht 65.0 in | Wt 175.5 lb

## 2011-07-08 NOTE — Progress Notes (Signed)
Subjective:     Patient ID: Yolanda Foley, female   DOB: 1940-05-21, 71 y.o.   MRN: [id number]  HPI Yolanda Foley is a 71 yr old female here today for f/u of elevated transaminases.  She was last seen in July of last yar.  She also has an elevated ferritin.  [place] was normal except for single gallstone felt to be incidental finding.  Her transaminases have been declining.  Appetite is good. No weight loss. She has actually gained 2 pounds since her last visit. She usually has a BM about one a day or two a day. No melena or rectal bleeding.     03/28/11 ALP 171, AST 37, ALT 29  From 10/11/2010  Sedimentation rate is 66. Serum ferritin 736 (was 918 on 06/27/2010)  ANA positive; ANA titer negative; bilirubin 0.5; AP 246; SGOT 44; SGPT 35; albumin 4.0  Assessment;  Review of Systems see hpi Current Outpatient Prescriptions  Medication Sig Dispense Refill  . aspirin (ASPIRIN LOW DOSE) 81 MG EC tablet Take 81 mg by mouth as needed. Take one tablet by mouth once a day      . Calcium Carbonate-Vit D-Min (CALTRATE 600+D PLUS) 600-400 MG-UNIT per tablet Take 1 tablet by mouth daily.        . Cholecalciferol (VITAMIN D) 2000 UNITS CAPS Take by mouth. One tab daily       . Diphenhydramine-Phenylephrine (BENADRYL-D ALLERGY/SINUS) 25-10 MG TABS Take by mouth as needed. Take one tablet by mouth every 4 hours       . Krill Oil 500 MG CAPS Take 1 capsule by mouth daily.        Marland Kitchen MISC NATURAL PRODUCTS PO Take by mouth. Ultimate BP - Patient takes 1 a day       . mometasone (NASONEX) 50 MCG/ACT nasal spray Place 2 sprays into the nose as needed. Use as directed      . Multiple Minerals (CALCIUM/MAGNESIUM/ZINC PO) Take by mouth daily.        . Multiple Vitamin (MULTIVITAMIN PO) Take by mouth daily.        Marland Kitchen olmesartan (BENICAR) 20 MG tablet Take 1 tablet (20 mg total) by mouth daily.  30 tablet  11  . omeprazole (PRILOSEC) 20 MG capsule Take 1 capsule (20 mg total) by mouth 2 (two) times daily.  60 capsule  4  .  ursodiol (ACTIGALL) 250 MG tablet Take 2 tablets (500 mg total) by mouth 2 times daily at 12 noon and 4 pm.  60 tablet  11  . VITAMIN K PO Take 1 tablet by mouth as needed.       . Misc Natural Products (COLON CARE PO) Take by mouth. 1 a day        Past Medical History  Diagnosis Date  . Depression   . Chronic pain   . GERD (gastroesophageal reflux disease)   . Palpitation   . Hyperlipidemia   . Hypertension   . Nonspecific elevation of levels of transaminase or lactic acid dehydrogenase (LDH)    Past Surgical History  Procedure Date  . Tonsillectomy 1967   . Left ovarian cyst removal 1974  . Correction of nasal surgery 35 years ago    History   Social History  . Marital Status: Married    Spouse Name: N/A    Number of Children: 4  . Years of Education: N/A   Occupational History  . retired     Social History Main Topics  . Smoking  status: Never Smoker   . Smokeless tobacco: Never Used  . Alcohol Use: No  . Drug Use: No  . Sexually Active: Not on file   Other Topics Concern  . Not on file   Social History Narrative  . No narrative on file   Family Status  Relation Status Death Age  . Mother Deceased   . Father Deceased   . Brother Alive   . Brother Alive   . Brother Deceased    Allergies  Allergen Reactions  . Sulfonamide Derivatives        Objective:   Physical Exam Filed Vitals:   07/08/11 1507  Height: 5\' 5"  (1.651 m)  Weight: 175 lb 8 oz (79.606 kg)   Alert and oriented. Skin warm and dry. Oral mucosa is moist.   . Sclera anicteric, conjunctivae is pink. Thyroid not enlarged. No cervical lymphadenopathy. Lungs clear. Heart regular rate and rhythm.  Abdomen is soft. Bowel sounds are positive. No hepatomegaly. No abdominal masses felt. No tenderness.  No edema to lower extremities. Patient is alert and oriented.      Assessment:    Elevated transaminases which are declining.    Plan:    sed rate, Cmet. OV in 6 months. We will continue to  monitor. Diet and exercise.

## 2011-07-08 NOTE — Patient Instructions (Signed)
Diet and exercise. Repeat C-met and a sed rate.

## 2011-07-09 LAB — COMPREHENSIVE METABOLIC PANEL
ALT: 37 U/L — ABNORMAL HIGH (ref 0–35)
Albumin: 3.9 g/dL (ref 3.5–5.2)
CO2: 26 mEq/L (ref 19–32)
Calcium: 9 mg/dL (ref 8.4–10.5)
Chloride: 102 mEq/L (ref 96–112)
Potassium: 4 mEq/L (ref 3.5–5.3)
Sodium: 138 mEq/L (ref 135–145)
Total Protein: 7.8 g/dL (ref 6.0–8.3)

## 2011-07-09 LAB — SEDIMENTATION RATE: Sed Rate: 71 mm/hr — ABNORMAL HIGH (ref 0–22)

## 2011-10-09 ENCOUNTER — Encounter: Payer: Self-pay | Admitting: Family Medicine

## 2011-10-09 ENCOUNTER — Ambulatory Visit (INDEPENDENT_AMBULATORY_CARE_PROVIDER_SITE_OTHER): Payer: Medicare Other | Admitting: Family Medicine

## 2011-10-09 ENCOUNTER — Other Ambulatory Visit: Payer: Self-pay | Admitting: Family Medicine

## 2011-10-09 VITALS — BP 160/102 | HR 78 | Resp 18 | Ht 65.0 in | Wt 174.1 lb

## 2011-10-09 DIAGNOSIS — R7309 Other abnormal glucose: Secondary | ICD-10-CM | POA: Diagnosis not present

## 2011-10-09 DIAGNOSIS — J301 Allergic rhinitis due to pollen: Secondary | ICD-10-CM

## 2011-10-09 DIAGNOSIS — E663 Overweight: Secondary | ICD-10-CM

## 2011-10-09 DIAGNOSIS — R7303 Prediabetes: Secondary | ICD-10-CM

## 2011-10-09 DIAGNOSIS — H547 Unspecified visual loss: Secondary | ICD-10-CM | POA: Diagnosis not present

## 2011-10-09 DIAGNOSIS — E785 Hyperlipidemia, unspecified: Secondary | ICD-10-CM

## 2011-10-09 DIAGNOSIS — J209 Acute bronchitis, unspecified: Secondary | ICD-10-CM | POA: Diagnosis not present

## 2011-10-09 DIAGNOSIS — I10 Essential (primary) hypertension: Secondary | ICD-10-CM

## 2011-10-09 MED ORDER — PENICILLIN V POTASSIUM 500 MG PO TABS: 500.0000 mg | ORAL_TABLET | Freq: Three times a day (TID) | ORAL | Status: AC

## 2011-10-09 MED ORDER — BENZONATATE 100 MG PO CAPS: 100.0000 mg | ORAL_CAPSULE | Freq: Four times a day (QID) | ORAL | Status: DC | PRN

## 2011-10-09 MED ORDER — CLONIDINE HCL 0.2 MG PO TABS: ORAL_TABLET | ORAL | Status: DC

## 2011-10-09 NOTE — Patient Instructions (Addendum)
F/u in 6 weeks.  You are being treated for bronchitis, decongestants are sent in, pls do not take with chlorcetin  You will get a 1500 calorie diet sheet and will be referred top the nutritionist for individual counseling.  Weight loss is important to improve your health , as well as regular exercise to lower your risk of becoming diabetic.  cXR at [facility] , you will get the form  HBA1C, TSH and CBc today  New additional medication for your blood pressure which is too high.  You are referred to Dr Melene Muller for eye exam

## 2011-10-09 NOTE — Progress Notes (Signed)
  Subjective:    Patient ID: Yolanda Foley, female    DOB: Dec 14, 1940, 71 y.o.   MRN: [id number]  HPI 3 week h/o head and chest congestion, chills, sputum at times green, feels weak and tired and fatigued.No documented fever. Has been followed for elevated LFT by GI Immunization and cancer screening also updated at this visit    Review of Systems See HPI  Denies chest pains, palpitations and leg swelling Denies abdominal pain, nausea, vomiting,diarrhea or constipation.   Denies dysuria, frequency, hesitancy or incontinence. Denies joint pain, swelling and limitation in mobility. Denies headaches, seizures, numbness, or tingling. Denies depression, anxiety or insomnia. Denies skin break down or rash.        Objective:   Physical Exam Patient alert and oriented and in no cardiopulmonary distress.  HEENT: No facial asymmetry, EOMI, frontal  sinus tenderness,  oropharynx pink and moist.  Neck supple no adenopathy.  Chest: decreased air entry, scattered crackles, no wheezes  CVS: S1, S2 no murmurs, no S3.  ABD: Soft non tender. Bowel sounds normal.  Ext: No edema  MS: Adequate ROM spine, shoulders, hips and knees.  Skin: Intact, no ulcerations or rash noted.  Psych: Good eye contact, normal affect. Memory intact not anxious or depressed appearing.  CNS: CN 2-12 intact, power, tone and sensation normal throughout.        Assessment & Plan:

## 2011-10-10 LAB — HEMOGLOBIN A1C
Hgb A1c MFr Bld: 5.7 % — ABNORMAL HIGH (ref <5.7)
Mean Plasma Glucose: 117 mg/dL — ABNORMAL HIGH (ref <117)

## 2011-10-10 LAB — CBC
MCH: 28.8 pg (ref 26.0–34.0)
MCHC: 32.6 g/dL (ref 30.0–36.0)
Platelets: 224 10*3/uL (ref 150–400)
RBC: 3.89 MIL/uL (ref 3.87–5.11)

## 2011-10-10 LAB — LIPID PANEL: Cholesterol: 208 mg/dL — ABNORMAL HIGH (ref 0–200)

## 2011-10-15 LAB — FERRITIN: Ferritin: 1140 ng/mL — ABNORMAL HIGH (ref 10–291)

## 2011-10-19 NOTE — Assessment & Plan Note (Signed)
Decongestants and antibiotics prescribed 

## 2011-10-19 NOTE — Assessment & Plan Note (Signed)
unchanged Patient re-educated about  the importance of commitment to a  minimum of 150 minutes of exercise per week. The importance of healthy food choices with portion control discussed. Encouraged to start a food diary, count calories and to consider  joining a support group. Sample diet sheets offered. Goals set by the patient for the next several months.    

## 2011-10-19 NOTE — Assessment & Plan Note (Signed)
Increased symptoms, as expected this time of year, use of nasal spray regularly encouraged

## 2011-10-19 NOTE — Assessment & Plan Note (Signed)
Dietary management only as she has fatty liver

## 2011-10-19 NOTE — Assessment & Plan Note (Signed)
Uncontrolled, additional medication started

## 2011-10-20 DIAGNOSIS — J209 Acute bronchitis, unspecified: Secondary | ICD-10-CM | POA: Diagnosis not present

## 2011-10-20 DIAGNOSIS — Z1231 Encounter for screening mammogram for malignant neoplasm of breast: Secondary | ICD-10-CM | POA: Diagnosis not present

## 2011-10-23 ENCOUNTER — Telehealth: Payer: Self-pay | Admitting: Family Medicine

## 2011-10-23 NOTE — Telephone Encounter (Signed)
Faxed order for chest xray to number provided

## 2011-11-04 ENCOUNTER — Encounter: Payer: Self-pay | Admitting: Family Medicine

## 2011-11-20 ENCOUNTER — Ambulatory Visit (INDEPENDENT_AMBULATORY_CARE_PROVIDER_SITE_OTHER): Payer: Medicare Other | Admitting: Family Medicine

## 2011-11-20 ENCOUNTER — Ambulatory Visit (INDEPENDENT_AMBULATORY_CARE_PROVIDER_SITE_OTHER): Payer: Medicare Other | Admitting: Otolaryngology

## 2011-11-20 ENCOUNTER — Encounter: Payer: Self-pay | Admitting: Family Medicine

## 2011-11-20 VITALS — BP 120/80 | HR 68 | Resp 18 | Ht 65.0 in | Wt 171.0 lb

## 2011-11-20 DIAGNOSIS — K8020 Calculus of gallbladder without cholecystitis without obstruction: Secondary | ICD-10-CM | POA: Diagnosis not present

## 2011-11-20 DIAGNOSIS — E785 Hyperlipidemia, unspecified: Secondary | ICD-10-CM

## 2011-11-20 DIAGNOSIS — K219 Gastro-esophageal reflux disease without esophagitis: Secondary | ICD-10-CM | POA: Diagnosis not present

## 2011-11-20 DIAGNOSIS — R490 Dysphonia: Secondary | ICD-10-CM | POA: Diagnosis not present

## 2011-11-20 DIAGNOSIS — E663 Overweight: Secondary | ICD-10-CM | POA: Diagnosis not present

## 2011-11-20 DIAGNOSIS — R7309 Other abnormal glucose: Secondary | ICD-10-CM | POA: Diagnosis not present

## 2011-11-20 DIAGNOSIS — R7303 Prediabetes: Secondary | ICD-10-CM

## 2011-11-20 DIAGNOSIS — I10 Essential (primary) hypertension: Secondary | ICD-10-CM

## 2011-11-20 MED ORDER — OLMESARTAN MEDOXOMIL 40 MG PO TABS: 40.0000 mg | ORAL_TABLET | Freq: Every day | ORAL | Status: DC

## 2011-11-20 MED ORDER — CLONIDINE HCL 0.1 MG PO TABS: ORAL_TABLET | ORAL | Status: DC

## 2011-11-20 NOTE — Patient Instructions (Addendum)
Annual wellness visit in 4 month, with rectal  Blood pressure is excellent, dose reduction in clonidine to 0.1mg  daily.  It is important to take blood pressure medication at the same time every day  It is important that you exercise regularly at least 30 minutes 5 times a week. If you develop chest pain, have severe difficulty breathing, or feel very tired, stop exercising immediately and seek medical attention   A healthy diet is rich in fruit, vegetables and whole grains. Poultry fish, nuts and beans are a healthy choice for protein rather then red meat. A low sodium diet and drinking 64 ounces of water daily is generally recommended. Oils and sweet should be limited. Carbohydrates especially for those who are diabetic or overweight, should be limited to 30-45 gram per meal. It is important to eat on a regular schedule, at least 3 times daily. Snacks should be primarily fruits, vegetables or nuts.

## 2011-11-22 NOTE — Assessment & Plan Note (Signed)
Improved. Pt applauded on succesful weight loss through lifestyle change, and encouraged to continue same. Weight loss goal set for the next several months.  

## 2011-11-22 NOTE — Assessment & Plan Note (Signed)
Asymptomatic , pt elects to continue conservative management

## 2011-11-22 NOTE — Progress Notes (Signed)
  Subjective:    Patient ID: Yolanda Foley, female    DOB: 06/16/1940, 71 y.o.   MRN: [id number]  HPI The PT is here for follow up and re-evaluation of chronic medical conditions, medication management and review of any available recent lab and radiology data.  Preventive health is updated, specifically  Cancer screening and Immunization.   Questions or concerns regarding consultations or procedures which the PT has had in the interim are  addressed. The PT reports excessive sleepiness with clonidine, sometimes takes in the day, thinks it works better then, I advised this is not the case and encouraged her to take at bedtime. Has been using half the prescribed dose of clonidine, better able to tolerate that Has worked on lifestyle change wit excellent results in hBA1C and weight loss  There are no new concerns.       Review of Systems See HPI Denies recent fever or chills. Denies sinus pressure, nasal congestion, ear pain or sore throat. Denies chest congestion, productive cough or wheezing. Denies chest pains, palpitations and leg swelling Denies abdominal pain, nausea, vomiting,diarrhea or constipation.   Denies dysuria, frequency, hesitancy or incontinence. Denies joint pain, swelling and limitation in mobility. Denies headaches, seizures, numbness, or tingling. Denies depression, anxiety or insomnia. Denies skin break down or rash.        Objective:   Physical Exam Patient drowsy and in no cardiopulmonary distress.  HEENT: No facial asymmetry, EOMI, no sinus tenderness,  oropharynx pink and moist.  Neck supple no adenopathy.  Chest: Clear to auscultation bilaterally.  CVS: S1, S2 no murmurs, no S3.  ABD: Soft non tender. Bowel sounds normal.  Ext: No edema  MS: Adequate ROM spine, shoulders, hips and knees.  Skin: Intact, no ulcerations or rash noted.  Psych: Good eye contact, normal affect. Memory intact not anxious or depressed appearing.  CNS: CN 2-12 intact,  power, tone and sensation normal throughout.        Assessment & Plan:

## 2011-11-22 NOTE — Assessment & Plan Note (Signed)
Hyperlipidemia:Low fat diet discussed and encouraged.  Dietary management only at this time 

## 2011-11-22 NOTE — Assessment & Plan Note (Signed)
Improved blood sugars with dietary change and weight loss

## 2011-11-22 NOTE — Assessment & Plan Note (Signed)
Controlled, no change in medication Followed by [facility]I at this time

## 2011-11-22 NOTE — Assessment & Plan Note (Signed)
Improved and controlled, actually using half the dose of clonidine, will reduce the dose

## 2011-11-27 ENCOUNTER — Encounter: Payer: Self-pay | Admitting: Family Medicine

## 2012-01-08 ENCOUNTER — Encounter (INDEPENDENT_AMBULATORY_CARE_PROVIDER_SITE_OTHER): Payer: Self-pay | Admitting: Internal Medicine

## 2012-01-08 ENCOUNTER — Other Ambulatory Visit (INDEPENDENT_AMBULATORY_CARE_PROVIDER_SITE_OTHER): Payer: Self-pay | Admitting: Internal Medicine

## 2012-01-08 ENCOUNTER — Ambulatory Visit (INDEPENDENT_AMBULATORY_CARE_PROVIDER_SITE_OTHER): Payer: Medicare Other | Admitting: Internal Medicine

## 2012-01-08 VITALS — BP 160/92 | HR 72 | Temp 98.0°F | Ht 65.0 in | Wt 168.0 lb

## 2012-01-08 DIAGNOSIS — R748 Abnormal levels of other serum enzymes: Secondary | ICD-10-CM

## 2012-01-08 NOTE — Progress Notes (Signed)
Subjective:     Patient ID: Yolanda Foley, female   DOB: 1940/05/02, 71 y.o.   MRN: [id number]  Yolanda Foley is a 71 yr old female here today for her elevated transaminases. She also has an elevated ferritin. She was last seen in March of this year. [place] was normal except for single gallstone felt to be incidental finding.  Her transaminases have been declining. Appetite is good. BM once a day. No melena or rectal bleeding.  She was placed on Urso about a year ago. She is exercising daily on the tread mill.     CMP     Component Value Date/Time   NA 138 07/08/2011 1514   K 4.0 07/08/2011 1514   CL 102 07/08/2011 1514   CO2 26 07/08/2011 1514   GLUCOSE 74 07/08/2011 1514   BUN 11 07/08/2011 1514   CREATININE 0.70 07/08/2011 1514   CREATININE 0.70 06/25/2010 1802   CALCIUM 9.0 07/08/2011 1514   PROT 7.8 07/08/2011 1514   ALBUMIN 3.9 07/08/2011 1514   AST 49* 07/08/2011 1514   ALT 37* 07/08/2011 1514   ALKPHOS 232* 07/08/2011 1514   BILITOT 0.3 07/08/2011 1514      Review of Systems see hpi Current Outpatient Prescriptions  Medication Sig Dispense Refill  . Ascorbic Acid (VITAMIN C WITH ROSE HIPS) 500 MG tablet Take 500 mg by mouth daily.      Marland Kitchen aspirin (ASPIRIN LOW DOSE) 81 MG EC tablet Take 81 mg by mouth as needed. Take one tablet by mouth once a day      . Calcium Carbonate-Vit D-Min (CALTRATE 600+D PLUS) 600-400 MG-UNIT per tablet Take 1 tablet by mouth daily.        . Cholecalciferol (VITAMIN D) 2000 UNITS CAPS Take by mouth. One tab daily       . Krill Oil 500 MG CAPS Take 1 capsule by mouth daily.        Marland Kitchen olmesartan (BENICAR) 40 MG tablet Take 1 tablet (40 mg total) by mouth daily.  30 tablet  11  . omeprazole (PRILOSEC) 20 MG capsule Take 20 mg by mouth 2 (two) times daily.      . ursodiol (ACTIGALL) 250 MG tablet Take 2 tablets (500 mg total) by mouth 2 times daily at 12 noon and 4 pm.  60 tablet  11  . benzonatate (TESSALON PERLES) 100 MG capsule Take 1 capsule (100 mg total) by mouth  every 6 (six) hours as needed for cough.  30 capsule  0  . cloNIDine (CATAPRES) 0.1 MG tablet Dose reduction effective 11/20/2011. One tablet at bedtime 10pm every night  30 tablet  11  . Diphenhydramine-Phenylephrine (BENADRYL-D ALLERGY/SINUS) 25-10 MG TABS Take by mouth as needed. Take one tablet by mouth every 4 hours       . Ferrous Sulfate (IRON) 325 (65 FE) MG TABS Take by mouth.      . Misc Natural Products (COLON CARE PO) Take by mouth. 1 a day       . MISC NATURAL PRODUCTS PO Take by mouth. Ultimate BP - Patient takes 1 a day       . MISC NATURAL PRODUCTS PO Take by mouth. Nerve tonic      . mometasone (NASONEX) 50 MCG/ACT nasal spray Place 2 sprays into the nose as needed. Use as directed      . Multiple Minerals (CALCIUM/MAGNESIUM/ZINC PO) Take by mouth daily.        . Multiple Vitamin (MULTIVITAMIN PO) Take by  mouth daily.        Marland Kitchen VITAMIN K PO Take 1 tablet by mouth as needed.        Past Medical History  Diagnosis Date  . Depression   . Chronic pain   . GERD (gastroesophageal reflux disease)   . Palpitation   . Hyperlipidemia   . Hypertension   . Nonspecific elevation of levels of transaminase or lactic acid dehydrogenase (LDH)   . Elevated liver enzymes    Past Surgical History  Procedure Date  . Tonsillectomy 1967   . Left ovarian cyst removal 1974  . Correction of nasal surgery 35 years ago    History   Social History  . Marital Status: Married    Spouse Name: N/A    Number of Children: 4  . Years of Education: N/A   Occupational History  . retired     Social History Main Topics  . Smoking status: Never Smoker   . Smokeless tobacco: Never Used  . Alcohol Use: No  . Drug Use: No  . Sexually Active: Not on file   Other Topics Concern  . Not on file   Social History Narrative  . No narrative on file   Family Status  Relation Status Death Age  . Mother Deceased   . Father Deceased   . Brother Alive   . Brother Alive   . Brother Deceased     Allergies  Allergen Reactions  . Fish Allergy     Cod   . Sulfonamide Derivatives        Objective:   Physical Exam Filed Vitals:   01/08/12 1456  BP: 160/92  Pulse: 72  Temp: 98 F (36.7 C)  Height: 5\' 5"  (1.651 m)  Weight: 168 lb (76.204 kg)   Alert and oriented. Skin warm and dry. Oral mucosa is moist.   . Sclera anicteric, conjunctivae is pink. Thyroid not enlarged. No cervical lymphadenopathy. Lungs clear. Heart regular rate and rhythm.  Abdomen is soft. Bowel sounds are positive. No hepatomegaly. No abdominal masses felt. No tenderness.  No edema to lower extremities.       Assessment:     Elevated liver enzymes.  ? PBC. Continue Urso. No evidence of liver disease at this time.   Plan:     AMA, SMA, smet, sedrate, ferritin OV 6 months

## 2012-01-08 NOTE — Patient Instructions (Addendum)
Labs today and OV in 6 months. 

## 2012-01-09 LAB — COMPREHENSIVE METABOLIC PANEL
AST: 51 U/L — ABNORMAL HIGH (ref 0–37)
Albumin: 4.2 g/dL (ref 3.5–5.2)
Alkaline Phosphatase: 308 U/L — ABNORMAL HIGH (ref 39–117)
Potassium: 4.5 mEq/L (ref 3.5–5.3)
Sodium: 138 mEq/L (ref 135–145)
Total Bilirubin: 0.5 mg/dL (ref 0.3–1.2)
Total Protein: 8.4 g/dL — ABNORMAL HIGH (ref 6.0–8.3)

## 2012-01-09 LAB — ANA: Anti Nuclear Antibody(ANA): POSITIVE — AB

## 2012-03-30 ENCOUNTER — Ambulatory Visit (INDEPENDENT_AMBULATORY_CARE_PROVIDER_SITE_OTHER): Payer: Medicare Other | Admitting: Family Medicine

## 2012-03-30 ENCOUNTER — Encounter: Payer: Self-pay | Admitting: Family Medicine

## 2012-03-30 VITALS — BP 160/90 | HR 82 | Resp 15 | Ht 65.0 in | Wt 169.0 lb

## 2012-03-30 DIAGNOSIS — H9190 Unspecified hearing loss, unspecified ear: Secondary | ICD-10-CM | POA: Insufficient documentation

## 2012-03-30 DIAGNOSIS — I10 Essential (primary) hypertension: Secondary | ICD-10-CM

## 2012-03-30 DIAGNOSIS — F329 Major depressive disorder, single episode, unspecified: Secondary | ICD-10-CM

## 2012-03-30 DIAGNOSIS — R7301 Impaired fasting glucose: Secondary | ICD-10-CM

## 2012-03-30 DIAGNOSIS — F3289 Other specified depressive episodes: Secondary | ICD-10-CM | POA: Diagnosis not present

## 2012-03-30 DIAGNOSIS — Z0000 Encounter for general adult medical examination without abnormal findings: Secondary | ICD-10-CM

## 2012-03-30 DIAGNOSIS — M25569 Pain in unspecified knee: Secondary | ICD-10-CM

## 2012-03-30 DIAGNOSIS — M25561 Pain in right knee: Secondary | ICD-10-CM

## 2012-03-30 DIAGNOSIS — F32A Depression, unspecified: Secondary | ICD-10-CM | POA: Insufficient documentation

## 2012-03-30 DIAGNOSIS — Z1211 Encounter for screening for malignant neoplasm of colon: Secondary | ICD-10-CM

## 2012-03-30 DIAGNOSIS — H9191 Unspecified hearing loss, right ear: Secondary | ICD-10-CM

## 2012-03-30 DIAGNOSIS — E785 Hyperlipidemia, unspecified: Secondary | ICD-10-CM

## 2012-03-30 LAB — POC HEMOCCULT BLD/STL (OFFICE/1-CARD/DIAGNOSTIC): Fecal Occult Blood, POC: NEGATIVE

## 2012-03-30 MED ORDER — FLUOXETINE HCL 10 MG PO TABS: 10.0000 mg | ORAL_TABLET | Freq: Every day | ORAL | Status: DC

## 2012-03-30 NOTE — Patient Instructions (Addendum)
F/u in 8 to 10 weeks.  Please get the flu vaccine at your pharmacy  You are referred for evaluation of the right knee, for hearing evaluation, and also for therapy.  New medication will be started for depression.  Please discuss advanced directives further with your children when you see them at Christmas.  Blood pressure is high today, please ensure you take medication as prescribed  CMP and HBA1C today

## 2012-03-30 NOTE — Progress Notes (Signed)
Subjective:    Patient ID: Yolanda Foley, female    DOB: 08-Apr-1941, 71 y.o.   MRN: [id number]  HPI Preventive Screening-Counseling & Management   Patient present here today for a Medicare annual wellness visit.   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 Never drug or alcohol use   Risk Factors  Current exercise habits:  Currently limited to 3 days per week  Dietary issues discussed:low carb and low fat diet   Cardiac risk factors: no significant   Depression Screen  (Note: if answer to either of the following is "Yes", a more complete depression screening is indicated)   Over the past two weeks, have you felt down, depressed or hopeless? No  Over the past two weeks, have you felt little interest or pleasure in doing things? No  Have you lost interest or pleasure in daily life? No  Do you often feel hopeless? sometimes Do you cry easily over simple problems? Sometimes, due to deterioration in spouse  Activities of Daily Living  In your present state of health, do you have any difficulty performing the following activities?  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 Yes Normal Appearance?Yes  Oriented to person? Yes Place? Yes  Time? Yes  Displays appropriate judgment?Yes  Can read the correct time from a watch face? yes Are you having problems remembering things?sometimes. Mini cog today 3/3  Advanced Directives have been discussed with the patient?Yes , full code, needs to discuss with children and document formally   List the Names of Other Physician/Practitioners you currently use: Dr Melene Muller, Dr Suszanne Conners , and Dr Karilyn Cota     Assessment:    Annual Wellness Exam   Plan:    During the course of the visit the patient was educated and counseled about appropriate screening and preventive services including:  A healthy diet is rich in fruit, vegetables and whole grains. Poultry fish, nuts and beans are a healthy choice for protein rather then red meat. A low sodium diet and drinking 64 ounces of water daily is generally recommended. Oils and sweet should be limited. Carbohydrates especially for those who are diabetic or overweight, should be limited to 30-45 gram per meal. It is important to eat on a regular schedule, at least 3 times daily. Snacks should be primarily fruits, vegetables or nuts. It is important that you exercise regularly at least 30 minutes 5 times a week. If you develop chest pain, have severe difficulty breathing, or feel very tired, stop exercising immediately and seek medical attention  Immunization reviewed and updated. Cancer screening reviewed and  updated    Patient Instructions (the written plan) was given to the patient.  Medicare Attestation  I have personally reviewed:  The patient's medical and social history  Their use of alcohol, tobacco or illicit drugs  Their current medications and supplements  The patient's functional ability including ADLs,fall  risks, home safety risks, cognitive, and hearing and visual impairment  Diet and physical activities  Evidence for depression or mood disorders  The patient's weight, height, BMI, and visual acuity have been recorded in the chart. I have made referrals, counseling, and provided education to the patient based on review of the above and I have provided the patient with a written personalized care plan for preventive services.      Review of Systems     Objective:   Physical Exam        Assessment & Plan:

## 2012-04-08 ENCOUNTER — Ambulatory Visit (HOSPITAL_COMMUNITY): Payer: Medicare Other | Admitting: Psychiatry

## 2012-04-08 DIAGNOSIS — M1710 Unilateral primary osteoarthritis, unspecified knee: Secondary | ICD-10-CM | POA: Diagnosis not present

## 2012-04-18 DIAGNOSIS — Z0000 Encounter for general adult medical examination without abnormal findings: Secondary | ICD-10-CM | POA: Insufficient documentation

## 2012-04-18 NOTE — Assessment & Plan Note (Signed)
Reports deterioration in hearing , will refer to eNT for further eval and treatment as deemed necessary

## 2012-04-18 NOTE — Assessment & Plan Note (Signed)
Uncontrolled at this visit. No med  As has been much better in the recnet past, med compliance remains an issue, importance of same stressed

## 2012-04-18 NOTE — Assessment & Plan Note (Signed)
Annual exam completed and documented. Pt has significant depression concerned re failing health in her spouse, states her children want them to move in wit them in [place], ut this currently has little appeal to her as she correctly believes she is capable of living independently. Is agreeable to both medication and counseling Slghtly increased fall risk due to knee pain and reported instability will refer for ortho eval Hearing needs further evaluation , based on history

## 2012-04-18 NOTE — Assessment & Plan Note (Signed)
Overwhelmed with deterioration in spouse's health. Will refer to therapy and pt to start meds. Not suicidal or homicidal

## 2012-05-05 ENCOUNTER — Ambulatory Visit (HOSPITAL_COMMUNITY): Payer: Medicare Other | Admitting: Psychology

## 2012-05-13 ENCOUNTER — Ambulatory Visit (INDEPENDENT_AMBULATORY_CARE_PROVIDER_SITE_OTHER): Payer: Self-pay | Admitting: Otolaryngology

## 2012-05-17 ENCOUNTER — Ambulatory Visit (HOSPITAL_COMMUNITY): Payer: Self-pay | Admitting: Psychology

## 2012-05-18 ENCOUNTER — Encounter: Payer: Self-pay | Admitting: Family Medicine

## 2012-05-18 ENCOUNTER — Ambulatory Visit (INDEPENDENT_AMBULATORY_CARE_PROVIDER_SITE_OTHER): Payer: Medicare Other | Admitting: Family Medicine

## 2012-05-18 VITALS — BP 140/80 | HR 84 | Resp 18 | Ht 65.0 in | Wt 169.1 lb

## 2012-05-18 DIAGNOSIS — D649 Anemia, unspecified: Secondary | ICD-10-CM

## 2012-05-18 DIAGNOSIS — I10 Essential (primary) hypertension: Secondary | ICD-10-CM | POA: Diagnosis not present

## 2012-05-18 DIAGNOSIS — E785 Hyperlipidemia, unspecified: Secondary | ICD-10-CM

## 2012-05-18 DIAGNOSIS — R7309 Other abnormal glucose: Secondary | ICD-10-CM

## 2012-05-18 DIAGNOSIS — E559 Vitamin D deficiency, unspecified: Secondary | ICD-10-CM

## 2012-05-18 DIAGNOSIS — E663 Overweight: Secondary | ICD-10-CM | POA: Diagnosis not present

## 2012-05-18 DIAGNOSIS — R5383 Other fatigue: Secondary | ICD-10-CM

## 2012-05-18 DIAGNOSIS — R498 Other voice and resonance disorders: Secondary | ICD-10-CM

## 2012-05-18 DIAGNOSIS — K219 Gastro-esophageal reflux disease without esophagitis: Secondary | ICD-10-CM

## 2012-05-18 DIAGNOSIS — R7303 Prediabetes: Secondary | ICD-10-CM

## 2012-05-18 DIAGNOSIS — R5381 Other malaise: Secondary | ICD-10-CM

## 2012-05-18 DIAGNOSIS — F329 Major depressive disorder, single episode, unspecified: Secondary | ICD-10-CM

## 2012-05-18 NOTE — Patient Instructions (Addendum)
F/u  June 20 or after, please call if you need me before Please stop all non prescription medications because they may be affecting your liver and we have no way of knowing, Stop  AscorBIC ACID, VITAMIN c Ginko and Krill oil  Please take fluoxetine every day, also get involved in activities outside of your home!  Fasting lipid, chem 7, TSH and vit D June 14 or after and hBA1C  Today HBA1C please.  Keep appointments with GI and ENT as before.  Please check the pharmacy for the shingles vaccine, you need this

## 2012-05-19 LAB — HEMOGLOBIN A1C
Hgb A1c MFr Bld: 5.7 % — ABNORMAL HIGH (ref <5.7)
Mean Plasma Glucose: 117 mg/dL — ABNORMAL HIGH (ref <117)

## 2012-05-20 DIAGNOSIS — H04129 Dry eye syndrome of unspecified lacrimal gland: Secondary | ICD-10-CM | POA: Diagnosis not present

## 2012-05-20 DIAGNOSIS — H26019 Infantile and juvenile cortical, lamellar, or zonular cataract, unspecified eye: Secondary | ICD-10-CM | POA: Diagnosis not present

## 2012-05-30 NOTE — Assessment & Plan Note (Signed)
Followed by ENT 

## 2012-05-30 NOTE — Assessment & Plan Note (Signed)
Controlled, no change in medication  

## 2012-05-30 NOTE — Assessment & Plan Note (Signed)
Unchanged. Patient re-educated about  the importance of commitment to a  minimum of 150 minutes of exercise per week. The importance of healthy food choices with portion control discussed. Encouraged to start a food diary, count calories and to consider  joining a support group. Sample diet sheets offered. Goals set by the patient for the next several months.    

## 2012-05-30 NOTE — Assessment & Plan Note (Signed)
Improved on medication pt to continue same

## 2012-05-30 NOTE — Progress Notes (Signed)
  Subjective:    Patient ID: Yolanda Foley, female    DOB: 29-Aug-1940, 72 y.o.   MRN: [id number]  HPI The PT is here for follow up and re-evaluation of chronic medical conditions,specifically hypertension and depression, medication management and review of any available recent lab and radiology data.  Preventive health is updated, specifically  Cancer screening and Immunization.   . The PT denies any adverse reactions to current medications since the last visit. Reports feeling better on prozac as far as being depressed and overwhelmed, no adverse side effects noted There are no new concerns.  There are no specific complaints  Intends to be more consistent in weight loss and daily exercise goals to improve her blood sugars      Review of Systems See HPI Denies recent fever or chills. Denies sinus pressure, nasal congestion, ear pain or sore throat. Denies chest congestion, productive cough or wheezing. Denies chest pains, palpitations and leg swelling Denies abdominal pain, nausea, vomiting,diarrhea or constipation.   Denies dysuria, frequency, hesitancy or incontinence. Denies joint pain, swelling and limitation in mobility. Denies headaches, seizures, numbness, or tingling. Denies depression, anxiety or insomnia. Denies skin break down or rash.        Objective:   Physical Exam Patient alert and oriented and in no cardiopulmonary distress.  HEENT: No facial asymmetry, EOMI, no sinus tenderness,  oropharynx pink and moist.  Neck supple no adenopathy.  Chest: Clear to auscultation bilaterally.  CVS: S1, S2 no murmurs, no S3.  ABD: Soft non tender. Bowel sounds normal.  Ext: No edema  MS: Adequate ROM spine, shoulders, hips and knees.  Skin: Intact, no ulcerations or rash noted.  Psych: Good eye contact, normal affect. Memory intact not anxious or depressed appearing.  CNS: CN 2-12 intact, power, tone and sensation normal throughout.        Assessment & Plan:

## 2012-05-30 NOTE — Assessment & Plan Note (Signed)
Unchanged Patient educated about the importance of limiting  Carbohydrate intake , the need to commit to daily physical activity for a minimum of 30 minutes , and to commit weight loss. The fact that changes in all these areas will reduce or eliminate all together the development of diabetes is stressed.    

## 2012-05-30 NOTE — Assessment & Plan Note (Signed)
Improved, dietary managemnt only

## 2012-05-30 NOTE — Assessment & Plan Note (Signed)
Improved, pt to be maintained on current meds. DASH diet and commitment to daily physical activity for a minimum of 30 minutes discussed and encouraged, as a part of hypertension management. The importance of attaining a healthy weight is also discussed.

## 2012-07-06 ENCOUNTER — Ambulatory Visit (INDEPENDENT_AMBULATORY_CARE_PROVIDER_SITE_OTHER): Payer: Medicare Other | Admitting: Internal Medicine

## 2012-07-06 ENCOUNTER — Telehealth (INDEPENDENT_AMBULATORY_CARE_PROVIDER_SITE_OTHER): Payer: Self-pay | Admitting: *Deleted

## 2012-07-06 ENCOUNTER — Encounter (INDEPENDENT_AMBULATORY_CARE_PROVIDER_SITE_OTHER): Payer: Self-pay | Admitting: Internal Medicine

## 2012-07-06 VITALS — BP 132/90 | HR 74 | Temp 98.2°F | Resp 18 | Ht 65.0 in | Wt 170.2 lb

## 2012-07-06 DIAGNOSIS — R196 Halitosis: Secondary | ICD-10-CM

## 2012-07-06 DIAGNOSIS — R6889 Other general symptoms and signs: Secondary | ICD-10-CM | POA: Diagnosis not present

## 2012-07-06 NOTE — Patient Instructions (Signed)
Physician will contact you with results of blood work. Please remember to exercise or walk at least 4 times a week.

## 2012-07-06 NOTE — Progress Notes (Signed)
Presenting complaint;  Followup for elevated transaminases. Patient also complains of halitosis.  Subjective:  Patient is 72 year old African female who is here for scheduled visit. She was last seen in September 2013. She is mildly elevated transaminases and positive anti-mitochondrial antibody. 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 She declined to undergo liver biopsy. She's been maintained on Urso. She denies abdominal pain pruritus or fatigue. She complains of halitosis that she's had for few years. Evaluation by her tenderness has been negative. She does not have any problem with dentures. She did try liquid chlorophyll at the suggestion of a friend but it did not help. She states her heartburn is well controlled with therapy. She remains active. She has lost 4 pounds in the last 6 months.  Current Medications: Current Outpatient Prescriptions  Medication Sig Dispense Refill  . aspirin (ASPIRIN LOW DOSE) 81 MG EC tablet Take 81 mg by mouth as needed. Take one tablet by mouth once a day      . calcium carbonate (TUMS - DOSED IN MG ELEMENTAL CALCIUM) 500 MG chewable tablet Chew 1 tablet by mouth as needed for heartburn.      . Calcium Carbonate-Vit D-Min (CALTRATE 600+D PLUS) 600-400 MG-UNIT per tablet Take 1 tablet by mouth daily.        . cloNIDine (CATAPRES) 0.1 MG tablet Dose reduction effective 11/20/2011. One tablet at bedtime 10pm every night  30 tablet  11  . Digestive Enzymes (PAPAYA ENZYME) CHEW Chew by mouth as needed.      Marland Kitchen FLUoxetine (PROZAC) 10 MG tablet Take 1 tablet (10 mg total) by mouth daily.  30 tablet  3  . Green Tea, Camillia sinensis, (GREEN TEA PO) Take by mouth. Patient states that this is a decaf tea and she drinks it 1-2 times daily      . KRILL OIL 1000 MG CAPS Take by mouth 2 (two) times daily.      . Methylsulfonylmethane (MSM) 500 MG CAPS Take by mouth 2 (two) times daily.      . Multiple Vitamin (MULTIVITAMIN PO) Take by mouth daily.        Marland Kitchen olmesartan (BENICAR) 40  MG tablet Take 1 tablet (40 mg total) by mouth daily.  30 tablet  11  . omeprazole (PRILOSEC) 20 MG capsule Take 20 mg by mouth 2 (two) times daily.      . ursodiol (ACTIGALL) 500 MG tablet Take 500 mg by mouth 2 (two) times daily.       No current facility-administered medications for this visit.     Objective: Blood pressure 132/90, pulse 74, temperature 98.2 F (36.8 C), temperature source Oral, resp. rate 18, height 5\' 5"  (1.651 m), weight 170 lb 3.2 oz (77.202 kg). Patient is alert and in no acute distress. Conjunctiva is pink. Sclera is nonicteric Oropharyngeal mucosa is normal. No neck masses or thyromegaly noted. Abdomen is symmetrical soft and nontender without organomegaly or masses. No LE edema or clubbing noted.  Labs/studies Results: LFTs from 01/08/2012. Bilirubin 0.5, AP 208, AST 51, ALT 41 and albumin 4.2.   Assessment:  #1. 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 She does not have stigmata of chronic liver disease or portal hypertension. Elevated serum ferritin most likely due to acute phase reaction rather than iron overload. It has been trending down. #2. Halitosis. Dental and ENT evaluation has been negative and she has not responded to PPI therapy. Doubt that it is secondary to liver disease.  Plan:  Discontinue fish oil. LFTs, serum ferritin and AMA levels. Office visit in one year.

## 2012-07-07 NOTE — Telephone Encounter (Signed)
This has been addressed.

## 2012-07-28 ENCOUNTER — Ambulatory Visit (INDEPENDENT_AMBULATORY_CARE_PROVIDER_SITE_OTHER): Payer: Medicare Other | Admitting: Family Medicine

## 2012-07-28 ENCOUNTER — Encounter: Payer: Self-pay | Admitting: Family Medicine

## 2012-07-28 VITALS — BP 144/84 | HR 90 | Resp 18 | Ht 65.0 in | Wt 169.1 lb

## 2012-07-28 DIAGNOSIS — J209 Acute bronchitis, unspecified: Secondary | ICD-10-CM

## 2012-07-28 DIAGNOSIS — I10 Essential (primary) hypertension: Secondary | ICD-10-CM | POA: Diagnosis not present

## 2012-07-28 DIAGNOSIS — J0100 Acute maxillary sinusitis, unspecified: Secondary | ICD-10-CM | POA: Diagnosis not present

## 2012-07-28 MED ORDER — BENZONATATE 100 MG PO CAPS: 100.0000 mg | ORAL_CAPSULE | Freq: Four times a day (QID) | ORAL | Status: DC | PRN

## 2012-07-28 MED ORDER — DOXYCYCLINE HYCLATE 100 MG PO CAPS: 100.0000 mg | ORAL_CAPSULE | Freq: Two times a day (BID) | ORAL | Status: DC

## 2012-07-28 MED ORDER — CEFTRIAXONE SODIUM 1 G IJ SOLR
500.0000 mg | Freq: Once | INTRAMUSCULAR | Status: AC
  Administered 2012-07-28: 500 mg via INTRAMUSCULAR

## 2012-07-28 NOTE — Patient Instructions (Addendum)
F/uas before, call if you need me sooner  You are treated for acute sinusitis and bronchitis.Rocephin 500mg  in the office, followed by 10 day course of doxycycline one twice daily   I hope that you feel better soon

## 2012-07-31 DIAGNOSIS — J0100 Acute maxillary sinusitis, unspecified: Secondary | ICD-10-CM | POA: Insufficient documentation

## 2012-07-31 NOTE — Assessment & Plan Note (Signed)
Controlled, no change in medication DASH diet and commitment to daily physical activity for a minimum of 30 minutes discussed and encouraged, as a part of hypertension management. The importance of attaining a healthy weight is also discussed.  

## 2012-07-31 NOTE — Progress Notes (Signed)
  Subjective:    Patient ID: Yolanda Foley, female    DOB: 01-26-1941, 72 y.o.   MRN: [id number]  HPI 1 week h/0 head and chest congestion with green nasal drainage and green sputum also has intermittent low grade fever and chills. Otherwise , has been doing well   Review of Systems See HPI Denies chest pains, palpitations and leg swelling Denies abdominal pain, nausea, vomiting,diarrhea or constipation.   Denies dysuria, frequency, hesitancy or incontinence. Denies joint pain, swelling and limitation in mobility. Denies headaches, seizures, numbness, or tingling. Denies depression, anxiety or insomnia. Denies skin break down or rash.        Objective:   Physical Exam Patient alert and oriented and in no cardiopulmonary distress.  HEENT: No facial asymmetry, EOMI,frontal and maxillary  sinus tenderness,  oropharynx pink and moist.  Neck supple anterior cervical  adenopathy.  Chest: adequate though reduced air entry, scattered crackles, no wheezes  CVS: S1, S2 no murmurs, no S3.  ABD: Soft non tender. Bowel sounds normal.  Ext: No edema  MS: Adequate ROM spine, shoulders, hips and knees.  Skin: Intact, no ulcerations or rash noted.  Psych: Good eye contact, normal affect. Memory intact not anxious or depressed appearing.  CNS: CN 2-12 intact, power, tone and sensation normal throughout.        Assessment & Plan:

## 2012-07-31 NOTE — Assessment & Plan Note (Signed)
antibioitic course prescribed and saline nasal flushes recommended

## 2012-07-31 NOTE — Assessment & Plan Note (Signed)
Rocephin i office followed by oral meds

## 2012-08-03 DIAGNOSIS — H04129 Dry eye syndrome of unspecified lacrimal gland: Secondary | ICD-10-CM | POA: Diagnosis not present

## 2012-08-03 DIAGNOSIS — H26019 Infantile and juvenile cortical, lamellar, or zonular cataract, unspecified eye: Secondary | ICD-10-CM | POA: Diagnosis not present

## 2012-08-16 DIAGNOSIS — H26019 Infantile and juvenile cortical, lamellar, or zonular cataract, unspecified eye: Secondary | ICD-10-CM | POA: Diagnosis not present

## 2012-08-16 DIAGNOSIS — H2510 Age-related nuclear cataract, unspecified eye: Secondary | ICD-10-CM | POA: Diagnosis not present

## 2012-08-16 DIAGNOSIS — H269 Unspecified cataract: Secondary | ICD-10-CM | POA: Diagnosis not present

## 2012-08-16 DIAGNOSIS — H2589 Other age-related cataract: Secondary | ICD-10-CM | POA: Diagnosis not present

## 2012-09-22 DIAGNOSIS — I10 Essential (primary) hypertension: Secondary | ICD-10-CM | POA: Diagnosis not present

## 2012-09-22 DIAGNOSIS — R7301 Impaired fasting glucose: Secondary | ICD-10-CM | POA: Diagnosis not present

## 2012-09-23 LAB — HEMOGLOBIN A1C: Hgb A1c MFr Bld: 5.6 % (ref <5.7)

## 2012-09-23 LAB — COMPREHENSIVE METABOLIC PANEL
Albumin: 4 g/dL (ref 3.5–5.2)
BUN: 9 mg/dL (ref 6–23)
CO2: 29 mEq/L (ref 19–32)
Calcium: 9.3 mg/dL (ref 8.4–10.5)
Chloride: 103 mEq/L (ref 96–112)
Creat: 0.68 mg/dL (ref 0.50–1.10)
Glucose, Bld: 86 mg/dL (ref 70–99)
Potassium: 4.4 mEq/L (ref 3.5–5.3)

## 2012-09-23 LAB — HEPATIC FUNCTION PANEL
Albumin: 4 g/dL (ref 3.5–5.2)
Total Bilirubin: 0.5 mg/dL (ref 0.3–1.2)

## 2012-09-23 LAB — FERRITIN: Ferritin: 738 ng/mL — ABNORMAL HIGH (ref 10–291)

## 2012-09-29 ENCOUNTER — Other Ambulatory Visit (INDEPENDENT_AMBULATORY_CARE_PROVIDER_SITE_OTHER): Payer: Self-pay | Admitting: Internal Medicine

## 2012-09-29 DIAGNOSIS — E663 Overweight: Secondary | ICD-10-CM

## 2012-09-29 DIAGNOSIS — I10 Essential (primary) hypertension: Secondary | ICD-10-CM

## 2012-09-29 DIAGNOSIS — E785 Hyperlipidemia, unspecified: Secondary | ICD-10-CM

## 2012-09-29 DIAGNOSIS — D649 Anemia, unspecified: Secondary | ICD-10-CM

## 2012-09-29 DIAGNOSIS — R5383 Other fatigue: Secondary | ICD-10-CM

## 2012-09-29 DIAGNOSIS — R7303 Prediabetes: Secondary | ICD-10-CM

## 2012-09-29 MED ORDER — URSODIOL 500 MG PO TABS: 500.0000 mg | ORAL_TABLET | Freq: Three times a day (TID) | ORAL | Status: DC

## 2012-09-30 ENCOUNTER — Telehealth (INDEPENDENT_AMBULATORY_CARE_PROVIDER_SITE_OTHER): Payer: Self-pay | Admitting: *Deleted

## 2012-09-30 DIAGNOSIS — R748 Abnormal levels of other serum enzymes: Secondary | ICD-10-CM

## 2012-09-30 NOTE — Telephone Encounter (Signed)
Apt has been scheduled for 01/03/13 at 2:15 pm with Dr. Karilyn Cota. Patient advised and voices understood.

## 2012-09-30 NOTE — Progress Notes (Signed)
Apt has been scheduled for 01/03/13 with Dr. Rehman. 

## 2012-09-30 NOTE — Telephone Encounter (Signed)
Per Dr.Rehman the patient will need to have labs drawn prior to office visit in 3 months. Labs are noted for 12/31/12.

## 2012-09-30 NOTE — Telephone Encounter (Signed)
I will address this with Dr.Rehman. 

## 2012-09-30 NOTE — Telephone Encounter (Signed)
Yolanda Foley would like to speak with Dr. Karilyn Cota about her GERD/Halitosis problem. This is a problem he said he would help her with. Her return is [phone].

## 2012-10-01 NOTE — Telephone Encounter (Signed)
Dr.Rehman made aware and he is going to call the patient.

## 2012-10-15 ENCOUNTER — Encounter: Payer: Self-pay | Admitting: Family Medicine

## 2012-10-15 ENCOUNTER — Telehealth: Payer: Self-pay | Admitting: Family Medicine

## 2012-10-15 ENCOUNTER — Ambulatory Visit (INDEPENDENT_AMBULATORY_CARE_PROVIDER_SITE_OTHER): Payer: Medicare Other | Admitting: Family Medicine

## 2012-10-15 VITALS — BP 144/82 | HR 88 | Resp 16 | Ht 65.0 in | Wt 170.8 lb

## 2012-10-15 DIAGNOSIS — E663 Overweight: Secondary | ICD-10-CM

## 2012-10-15 DIAGNOSIS — F329 Major depressive disorder, single episode, unspecified: Secondary | ICD-10-CM

## 2012-10-15 DIAGNOSIS — D649 Anemia, unspecified: Secondary | ICD-10-CM

## 2012-10-15 DIAGNOSIS — E785 Hyperlipidemia, unspecified: Secondary | ICD-10-CM

## 2012-10-15 DIAGNOSIS — R5383 Other fatigue: Secondary | ICD-10-CM

## 2012-10-15 DIAGNOSIS — I10 Essential (primary) hypertension: Secondary | ICD-10-CM | POA: Diagnosis not present

## 2012-10-15 DIAGNOSIS — R7309 Other abnormal glucose: Secondary | ICD-10-CM

## 2012-10-15 DIAGNOSIS — R5381 Other malaise: Secondary | ICD-10-CM | POA: Diagnosis not present

## 2012-10-15 DIAGNOSIS — R7303 Prediabetes: Secondary | ICD-10-CM

## 2012-10-15 DIAGNOSIS — R498 Other voice and resonance disorders: Secondary | ICD-10-CM

## 2012-10-15 NOTE — Patient Instructions (Addendum)
F/u in 8 to 10  weeks, call if you need me before  Blood pressure is excellent, take the be+icar only, once daily as you already are taking it.   We will again make EVERY effort to get you to the female therapist that you specifically requested since December, I am sure that you will benefit    Fasting lipid, cbc and TSH before next visit pls

## 2012-10-15 NOTE — Assessment & Plan Note (Signed)
Hyperlipidemia:Low fat diet discussed and encouraged.  Updated lab next visit Has liver disease so statins are contraindicated

## 2012-10-15 NOTE — Progress Notes (Signed)
  Subjective:    Patient ID: Yolanda Foley, female    DOB: Jan 15, 1941, 72 y.o.   MRN: [id number]  HPI  The PT is here for follow up and re-evaluation of chronic medical conditions, medication management and review of any available recent lab and radiology data.  Preventive health is updated, specifically  Cancer screening and Immunization.   Questions or concerns regarding consultations or procedures which the PT has had in the interim are  Addressed.Is followed by both ENT and GI, still appears to have little insight into her chronic diseases, though she is invested in her health Intolerant of clonidine, based on exam today, she does not need this. Took prozac for a short while, it made her light headed, still depressed, easily tears up discussing her spouse who has  health issues and is not seeing any MD     Review of Systems See HPI Denies recent fever or chills. Denies sinus pressure, nasal congestion, ear pain or sore throat.Chronic c/o hoarseness and halitosis remain unchanged Denies chest congestion, productive cough or wheezing. Denies chest pains, palpitations and leg swelling Denies abdominal pain, nausea, vomiting,diarrhea or constipation.   Denies dysuria, frequency, hesitancy or incontinence. Denies joint pain, swelling and limitation in mobility. Denies headaches, seizures, numbness, or tingling. Feels irritable and tired all the time, problems with living with her spouse becoming increasingly challenging, still wants therapy Denies skin break down or rash.        Objective:   Physical Exam  Patient alert and oriented and in no cardiopulmonary distress.  HEENT: No facial asymmetry, EOMI, no sinus tenderness,  oropharynx pink and moist.  Neck supple no adenopathy.  Chest: Clear to auscultation bilaterally.  CVS: S1, S2 no murmurs, no S3.  ABD: Soft non tender. Bowel sounds normal.  Ext: No edema  MS: Adequate ROM spine, shoulders, hips and knees.  Skin:  Intact, no ulcerations or rash noted.  Psych: Good eye contact, tearful at times affect. Memory intact mildly anxious and depressed appearing.  CNS: CN 2-12 intact, power, tone and sensation normal throughout.       Assessment & Plan:

## 2012-10-15 NOTE — Assessment & Plan Note (Signed)
Corrected with lifestyle change

## 2012-10-15 NOTE — Assessment & Plan Note (Signed)
Unchanged. Patient re-educated about  the importance of commitment to a  minimum of 150 minutes of exercise per week. The importance of healthy food choices with portion control discussed. Encouraged to start a food diary, count calories and to consider  joining a support group. Sample diet sheets offered. Goals set by the patient for the next several months.    

## 2012-10-15 NOTE — Telephone Encounter (Signed)
Noted,pls explain to pt that per policy of office , she will not be able to see the female therapist there at [facility] Other than [facility] , I am not aware of any others i n the area. I personally doubt she would be interested in therapy there, but you may offer this option also Ask he rif wants to go to [facility] and see if there is a female therapist available (doubt she will drive to [place], but pls offer and follow through if she wants that)

## 2012-10-15 NOTE — Telephone Encounter (Signed)
Back and complained the whole time of not getting to see a female Dr. Patient was scheduled again and she cancelled the appointment thought she had a virus and one time cancel conflict of interest per [facility]

## 2012-10-15 NOTE — Assessment & Plan Note (Signed)
ongoing complaint being also followed by ENT and GI

## 2012-10-15 NOTE — Assessment & Plan Note (Signed)
Pt under a lot of stress in the home due to spouse's behavior, question of early dementia, does not see MD, was always possessive She will start the fluoxetine which she briefly tried in place of OTC herbals We will again try to connect her with therapy, she wants and needs this

## 2012-10-15 NOTE — Assessment & Plan Note (Signed)
Controlled, no change in medication DASH diet and commitment to daily physical activity for a minimum of 30 minutes discussed and encouraged, as a part of hypertension management. The importance of attaining a healthy weight is also discussed.  

## 2012-10-18 NOTE — Telephone Encounter (Signed)
Spoke with patient she does not want to go to [facility] she stated that she would go to [facility] so I faxed over paper work and stated that she wants to see a Female Therapist patient is aware they will call her with an appointment

## 2012-10-28 ENCOUNTER — Telehealth: Payer: Self-pay | Admitting: Family Medicine

## 2012-10-31 NOTE — Telephone Encounter (Signed)
Noted, if unable to contact her after 1 attempt, I suggest no further attempts from this office , she is aware of the referral and need t be seen , this was discussed at length at visit. Thanks for your concern and attempts to follow through with this

## 2012-11-23 DIAGNOSIS — H26019 Infantile and juvenile cortical, lamellar, or zonular cataract, unspecified eye: Secondary | ICD-10-CM | POA: Diagnosis not present

## 2012-12-22 ENCOUNTER — Encounter (INDEPENDENT_AMBULATORY_CARE_PROVIDER_SITE_OTHER): Payer: Self-pay | Admitting: *Deleted

## 2012-12-22 ENCOUNTER — Other Ambulatory Visit (INDEPENDENT_AMBULATORY_CARE_PROVIDER_SITE_OTHER): Payer: Self-pay | Admitting: *Deleted

## 2012-12-22 DIAGNOSIS — R748 Abnormal levels of other serum enzymes: Secondary | ICD-10-CM

## 2012-12-23 ENCOUNTER — Encounter: Payer: Self-pay | Admitting: Family Medicine

## 2012-12-23 ENCOUNTER — Ambulatory Visit (INDEPENDENT_AMBULATORY_CARE_PROVIDER_SITE_OTHER): Payer: Medicare Other | Admitting: Family Medicine

## 2012-12-23 VITALS — BP 160/90 | HR 91 | Temp 97.8°F | Resp 16 | Ht 65.0 in | Wt 170.1 lb

## 2012-12-23 DIAGNOSIS — E785 Hyperlipidemia, unspecified: Secondary | ICD-10-CM | POA: Diagnosis not present

## 2012-12-23 DIAGNOSIS — I10 Essential (primary) hypertension: Secondary | ICD-10-CM | POA: Diagnosis not present

## 2012-12-23 DIAGNOSIS — F329 Major depressive disorder, single episode, unspecified: Secondary | ICD-10-CM | POA: Diagnosis not present

## 2012-12-23 DIAGNOSIS — D649 Anemia, unspecified: Secondary | ICD-10-CM

## 2012-12-23 DIAGNOSIS — J301 Allergic rhinitis due to pollen: Secondary | ICD-10-CM

## 2012-12-23 DIAGNOSIS — K219 Gastro-esophageal reflux disease without esophagitis: Secondary | ICD-10-CM

## 2012-12-23 DIAGNOSIS — R7303 Prediabetes: Secondary | ICD-10-CM

## 2012-12-23 DIAGNOSIS — R5381 Other malaise: Secondary | ICD-10-CM

## 2012-12-23 DIAGNOSIS — R7309 Other abnormal glucose: Secondary | ICD-10-CM

## 2012-12-23 LAB — LIPID PANEL
HDL: 64 mg/dL (ref >39)
LDL Cholesterol: 129 mg/dL — ABNORMAL HIGH (ref 0–99)
Total CHOL/HDL Ratio: 3.3 Ratio
Triglycerides: 82 mg/dL (ref <150)
VLDL: 16 mg/dL (ref 0–40)

## 2012-12-23 MED ORDER — CLONIDINE HCL 0.1 MG PO TABS: ORAL_TABLET | ORAL | Status: DC

## 2012-12-23 NOTE — Assessment & Plan Note (Signed)
Uncontrolled due to non compliance, has again stopped the clonidine, states she sleeps too much. Agrees to comply, realizes BP is too high DASH diet and commitment to daily physical activity for a minimum of 30 minutes discussed and encouraged, as a part of hypertension management. The importance of attaining a healthy weight is also discussed.

## 2012-12-23 NOTE — Assessment & Plan Note (Signed)
Increased allergy symptoms due to excessive exposure to lavender air freshner, has removed same and will start flonase also

## 2012-12-23 NOTE — Assessment & Plan Note (Addendum)
Controlled, no change in medication Followed by GI 

## 2012-12-23 NOTE — Assessment & Plan Note (Signed)
Reports headache with prozac, states she is "ready" for therapy. Will use no medication at thsi time, I believe therapy once started will be more beneficial , she is "medication intolerant " and also dislikes taking meds

## 2012-12-23 NOTE — Progress Notes (Signed)
  Subjective:    Patient ID: Yolanda Foley, female    DOB: 1941/04/26, 72 y.o.   MRN: [id number]  HPI The PT is here for follow up and re-evaluation of chronic medical conditions, medication management and review of any available recent lab and radiology data.  Preventive health is updated, specifically  Cancer screening and Immunization.   States she stopped fluoxetine after 1 month due to headache, and states she is "now ready " to see therapist Recently stopped clonidine again, but willing to go back on it as best tolerated Excess nasal congestion and dry cough for 1 week after exposure to lavender air freshner  Notes fatigue and states she is always anemic, no labs since past 14 month so ordered today      Review of Systems See HPI Denies recent fever or chills.  Denies chest congestion, productive cough or wheezing. Denies chest pains, palpitations and leg swelling Denies abdominal pain, nausea, vomiting,diarrhea or constipation.   Denies dysuria, frequency, hesitancy or incontinence. Denies joint pain, swelling and limitation in mobility. Denies headaches, seizures, numbness, or tingling.  Denies skin break down or rash.        Objective:   Physical Exam Patient alert and oriented and in no cardiopulmonary distress.  HEENT: No facial asymmetry, EOMI, no sinus tenderness,  oropharynx pink and moist.  Neck supple no adenopathy.No significant nasal edema or erythema noted. Tender  In left submandibular area which is chronic  Chest: Clear to auscultation bilaterally.  CVS: S1, S2 no murmurs, no S3.  ABD: Soft non tender. Bowel sounds normal.  Ext: No edema  MS: Adequate ROM spine, shoulders, hips and knees.  Skin: Intact, no ulcerations or rash noted.  Psych: Good eye contact, normal affect. Memory intact not anxious or depressed appearing.  CNS: CN 2-12 intact, power, tone and sensation normal throughout.        Assessment & Plan:

## 2012-12-23 NOTE — Patient Instructions (Addendum)
F/u in 3 month, call if you need me before  Please go to therapy for depression, no new medication for this at this time. We will give you the address [facility] so you can make your appointment  Blood pressure is high start clonidine at bedtime again, you DO need this  Please try to commit to physical activity for 20 to 30 minutes every day, your energy will increase  Lungs are clear, use flonase spray for allergies  Labs today cbc for anemia, lipids and TSH

## 2012-12-23 NOTE — Assessment & Plan Note (Signed)
Hyperlipidemia:Low fat diet discussed and encouraged.  Updated lab today. Due to liver , statins are not an option

## 2012-12-23 NOTE — Assessment & Plan Note (Signed)
C/o fatigue and has chronic anemia, updated lab today She is encouraged to resume regular exercise also

## 2012-12-24 LAB — CBC WITH DIFFERENTIAL/PLATELET
Eosinophils Relative: 3 % (ref 0–5)
HCT: 33.9 % — ABNORMAL LOW (ref 36.0–46.0)
Hemoglobin: 11 g/dL — ABNORMAL LOW (ref 12.0–15.0)
Lymphocytes Relative: 34 % (ref 12–46)
MCHC: 32.4 g/dL (ref 30.0–36.0)
MCV: 88.7 fL (ref 78.0–100.0)
Monocytes Absolute: 0.3 10*3/uL (ref 0.1–1.0)
Monocytes Relative: 6 % (ref 3–12)
Neutro Abs: 2.7 10*3/uL (ref 1.7–7.7)
RDW: 14.7 % (ref 11.5–15.5)
WBC: 4.8 10*3/uL (ref 4.0–10.5)

## 2012-12-24 LAB — TSH: TSH: 1.002 u[IU]/mL (ref 0.350–4.500)

## 2013-01-03 ENCOUNTER — Other Ambulatory Visit (INDEPENDENT_AMBULATORY_CARE_PROVIDER_SITE_OTHER): Payer: Self-pay | Admitting: Internal Medicine

## 2013-01-03 ENCOUNTER — Encounter (INDEPENDENT_AMBULATORY_CARE_PROVIDER_SITE_OTHER): Payer: Self-pay | Admitting: Internal Medicine

## 2013-01-03 ENCOUNTER — Telehealth (INDEPENDENT_AMBULATORY_CARE_PROVIDER_SITE_OTHER): Payer: Self-pay | Admitting: *Deleted

## 2013-01-03 ENCOUNTER — Ambulatory Visit (INDEPENDENT_AMBULATORY_CARE_PROVIDER_SITE_OTHER): Payer: Medicare Other | Admitting: Internal Medicine

## 2013-01-03 VITALS — BP 130/80 | HR 76 | Temp 97.9°F | Resp 18 | Ht 65.0 in | Wt 169.1 lb

## 2013-01-03 DIAGNOSIS — K745 Biliary cirrhosis, unspecified: Secondary | ICD-10-CM

## 2013-01-03 DIAGNOSIS — K743 Primary biliary cirrhosis: Secondary | ICD-10-CM

## 2013-01-03 MED ORDER — URSODIOL 300 MG PO CAPS: 300.0000 mg | ORAL_CAPSULE | Freq: Three times a day (TID) | ORAL | Status: DC

## 2013-01-03 NOTE — Progress Notes (Signed)
Presenting complaint;  Followup for PBC.  Subjective:  Patient is 72 year old African female who presents for scheduled visit. She was last seen 6 months ago. She was diagnosed with PBC based on lab studies and positive mitochondrial M2 antibody. On her last visit she was also complaining of halitosis and stopping fissural did not ameliorate the symptom. She is using chlorophyll which helps for couple of hours after each dose or two. She had lab studies in May when dose of Urso was increased to 1.5 g per day. She states she is taking Urso only once a day. She believes taking second or third dose makes her weak and sleepy. She is also taking a blood pressure medication as well as Prozac 1 when necessary basis. She has been taking the liver planned which is OTC medication. She does not have good appetite but her weight has been stable since March 2014. She denies abdominal pain pruritus melena or rectal bleeding.  Current Medications: Current Outpatient Prescriptions  Medication Sig Dispense Refill  . aspirin (ASPIRIN LOW DOSE) 81 MG EC tablet Take 81 mg by mouth as needed. Take one tablet by mouth once a day      . calcium carbonate (TUMS - DOSED IN MG ELEMENTAL CALCIUM) 500 MG chewable tablet Chew 1 tablet by mouth as needed for heartburn.      . Calcium Carbonate-Vit D-Min (CALTRATE 600+D PLUS) 600-400 MG-UNIT per tablet Take 1 tablet by mouth daily.        . Chlorophyll 50 MG CAPS Take 3 capsules by mouth daily.      . cloNIDine (CATAPRES) 0.1 MG tablet One tablet every night at 9pm for blood pressure  30 tablet  3  . Coenzyme Q10-Fish Oil-Vit E (CO-Q 10 OMEGA-3 FISH OIL) CAPS Take 2 capsules by mouth daily.      . Digestive Enzymes (PAPAYA ENZYME) CHEW Chew by mouth as needed.      Marland Kitchen FLUoxetine (PROZAC) 10 MG tablet Take 10 mg by mouth daily.      Chilton Si Tea, Camillia sinensis, (GREEN TEA PO) Take by mouth. Patient states that this is a decaf tea and she drinks it 1-2 times daily      .  Methylsulfonylmethane (MSM) 500 MG CAPS Take by mouth 2 (two) times daily.      . Multiple Vitamin (MULTIVITAMIN PO) Take by mouth daily.        Marland Kitchen olmesartan (BENICAR) 40 MG tablet Take 1 tablet (40 mg total) by mouth daily.  30 tablet  11  . omeprazole (PRILOSEC) 20 MG capsule Take 20 mg by mouth 2 (two) times daily.      . ursodiol (ACTIGALL) 500 MG tablet Take 1 tablet (500 mg total) by mouth 3 (three) times daily.  90 tablet  11   No current facility-administered medications for this visit.     Objective: Blood pressure 130/80, pulse 76, temperature 97.9 F (36.6 C), temperature source Oral, resp. rate 18, height 5\' 5"  (1.651 m), weight 169 lb 1.6 oz (76.703 kg). Patient is alert and in no acute distress. Conjunctiva is pink. Sclera is nonicteric Oropharyngeal mucosa is normal. No neck masses or thyromegaly noted. Cardiac exam with regular rhythm normal S1 and S2. No murmur or gallop noted. Lungs are clear to auscultation. Abdomen symmetrical soft and nontender without hepatosplenomegaly. No LE edema or clubbing noted.  Labs/studies Results: LFTs from 09/02/2012. Tropin oh 0.5, AP 297, AST 54 and ALT 41; serum albumin 4.0. LFTs on 01/08/2012. Bilirubin 0.5,  AP 308, AST 51, ALT 41 and albumin 4.2 LFTs from 03/28/2011. Bilirubin 0.3, AP 171, AST 37, ALT 29 and albumin 4.1.    Assessment:  #1. Primary glittery cirrhosis. Stage unknown as she is never undergone liver biopsy but she does not appear to have stigmata of stage III or IV disease. Transaminases and alkaline phosphatase are gradually creeping up. She unfortunately is not taking her medication as recommended.    Plan:  Discontinue Urso. Begin Actigall 300 mg by mouth 3 times a day. Patient will go to the lab for LFTs and mitochondrial M2 antibody. Office visit in 6 months.

## 2013-01-03 NOTE — Telephone Encounter (Signed)
Labs per Dr.rehman

## 2013-01-04 LAB — HEPATIC FUNCTION PANEL
ALT: 45 U/L — ABNORMAL HIGH (ref 0–35)
AST: 58 U/L — ABNORMAL HIGH (ref 0–37)
Albumin: 4.2 g/dL (ref 3.5–5.2)
Alkaline Phosphatase: 331 U/L — ABNORMAL HIGH (ref 39–117)

## 2013-01-05 LAB — ANTI-SMOOTH MUSCLE ANTIBODY, IGG: Smooth Muscle Ab: 30 U — ABNORMAL HIGH (ref <20)

## 2013-01-06 DIAGNOSIS — F329 Major depressive disorder, single episode, unspecified: Secondary | ICD-10-CM | POA: Diagnosis not present

## 2013-01-13 ENCOUNTER — Telehealth (INDEPENDENT_AMBULATORY_CARE_PROVIDER_SITE_OTHER): Payer: Self-pay | Admitting: *Deleted

## 2013-01-13 DIAGNOSIS — K743 Primary biliary cirrhosis: Secondary | ICD-10-CM

## 2013-01-13 DIAGNOSIS — R748 Abnormal levels of other serum enzymes: Secondary | ICD-10-CM

## 2013-01-13 NOTE — Telephone Encounter (Signed)
Per Dr.Rehman the patient will need to have labs drawn in 12 weeks. 

## 2013-02-17 ENCOUNTER — Other Ambulatory Visit (INDEPENDENT_AMBULATORY_CARE_PROVIDER_SITE_OTHER): Payer: Self-pay | Admitting: *Deleted

## 2013-02-17 ENCOUNTER — Encounter (INDEPENDENT_AMBULATORY_CARE_PROVIDER_SITE_OTHER): Payer: Self-pay | Admitting: *Deleted

## 2013-02-17 DIAGNOSIS — K743 Primary biliary cirrhosis: Secondary | ICD-10-CM

## 2013-02-17 DIAGNOSIS — R748 Abnormal levels of other serum enzymes: Secondary | ICD-10-CM

## 2013-02-28 DIAGNOSIS — K745 Biliary cirrhosis, unspecified: Secondary | ICD-10-CM | POA: Diagnosis not present

## 2013-02-28 DIAGNOSIS — R748 Abnormal levels of other serum enzymes: Secondary | ICD-10-CM | POA: Diagnosis not present

## 2013-03-01 LAB — HEPATIC FUNCTION PANEL
ALT: 20 U/L (ref 0–35)
AST: 26 U/L (ref 0–37)
Albumin: 3.7 g/dL (ref 3.5–5.2)
Alkaline Phosphatase: 124 U/L — ABNORMAL HIGH (ref 39–117)
Bilirubin, Direct: 0.1 mg/dL (ref 0.0–0.3)
Indirect Bilirubin: 0.2 mg/dL (ref 0.0–0.9)
Total Bilirubin: 0.3 mg/dL (ref 0.3–1.2)
Total Protein: 7.7 g/dL (ref 6.0–8.3)

## 2013-03-02 ENCOUNTER — Telehealth (INDEPENDENT_AMBULATORY_CARE_PROVIDER_SITE_OTHER): Payer: Self-pay | Admitting: *Deleted

## 2013-03-02 DIAGNOSIS — K743 Primary biliary cirrhosis: Secondary | ICD-10-CM

## 2013-03-02 NOTE — Telephone Encounter (Signed)
Per Dr.Rehman the patient will need to have labs drawn in 6 months. 

## 2013-03-03 ENCOUNTER — Other Ambulatory Visit: Payer: Self-pay

## 2013-03-15 ENCOUNTER — Encounter: Payer: Self-pay | Admitting: Family Medicine

## 2013-03-15 ENCOUNTER — Ambulatory Visit (INDEPENDENT_AMBULATORY_CARE_PROVIDER_SITE_OTHER): Payer: Medicare Other | Admitting: Family Medicine

## 2013-03-15 VITALS — BP 180/100 | HR 80 | Resp 18 | Ht 65.0 in | Wt 174.0 lb

## 2013-03-15 DIAGNOSIS — R498 Other voice and resonance disorders: Secondary | ICD-10-CM

## 2013-03-15 DIAGNOSIS — R7309 Other abnormal glucose: Secondary | ICD-10-CM | POA: Diagnosis not present

## 2013-03-15 DIAGNOSIS — Z23 Encounter for immunization: Secondary | ICD-10-CM | POA: Diagnosis not present

## 2013-03-15 DIAGNOSIS — Z13820 Encounter for screening for osteoporosis: Secondary | ICD-10-CM

## 2013-03-15 DIAGNOSIS — R7402 Elevation of levels of lactic acid dehydrogenase (LDH): Secondary | ICD-10-CM

## 2013-03-15 DIAGNOSIS — R7303 Prediabetes: Secondary | ICD-10-CM

## 2013-03-15 DIAGNOSIS — E785 Hyperlipidemia, unspecified: Secondary | ICD-10-CM

## 2013-03-15 DIAGNOSIS — I10 Essential (primary) hypertension: Secondary | ICD-10-CM

## 2013-03-15 DIAGNOSIS — F329 Major depressive disorder, single episode, unspecified: Secondary | ICD-10-CM

## 2013-03-15 DIAGNOSIS — M899 Disorder of bone, unspecified: Secondary | ICD-10-CM

## 2013-03-15 DIAGNOSIS — F3289 Other specified depressive episodes: Secondary | ICD-10-CM

## 2013-03-15 MED ORDER — OLMESARTAN MEDOXOMIL 20 MG PO TABS
20.0000 mg | ORAL_TABLET | Freq: Every day | ORAL | Status: DC
Start: 2013-03-15 — End: 2013-05-18

## 2013-03-15 MED ORDER — CLONIDINE HCL 0.1 MG PO TABS: ORAL_TABLET | ORAL | Status: DC

## 2013-03-15 NOTE — Progress Notes (Signed)
  Subjective:    Patient ID: Yolanda Foley, female    DOB: Sep 04, 1940, 72 y.o.   MRN: [id number]  HPI The PT is here for follow up and re-evaluation of chronic medical conditions, medication management and review of any available recent lab and radiology data.  Preventive health is updated, specifically  Cancer screening and Immunization.   Questions or concerns regarding consultations or procedures which the PT has had in the interim are  Addressed.has been receiving therapy through [facility] and states this is helping The PT denies any adverse reactions to current medications since the last visit. Still remains non compliant with blood pressure meds with resultant ongoing uncontrolled "asymptomatic" hypertension, I again explain the "silent killer" that HTN is There are no new concerns.  There are no specific complaints       Review of Systems     Objective:   Physical Exam        Assessment & Plan:

## 2013-03-15 NOTE — Patient Instructions (Signed)
F/u in 2 month, call if you ned me before  Flu vaccine today.    You are referred for a bone density test.  Blood pressure remains high.  You need to take blood pressure medication EVERY DAY at the SAME TIME, call if you have problems with this please  Fasting lipid and chem 7 and vit D in 2 month, before next visit.  Happy that your liver function is much improved

## 2013-03-17 DIAGNOSIS — F329 Major depressive disorder, single episode, unspecified: Secondary | ICD-10-CM | POA: Diagnosis not present

## 2013-03-28 ENCOUNTER — Ambulatory Visit (HOSPITAL_COMMUNITY)
Admission: RE | Admit: 2013-03-28 | Discharge: 2013-03-28 | Disposition: A | Discharge status: Home / Self Care | Payer: Medicare Other | Source: Ambulatory Visit | Attending: Family Medicine | Admitting: Family Medicine

## 2013-03-28 DIAGNOSIS — M818 Other osteoporosis without current pathological fracture: Secondary | ICD-10-CM | POA: Insufficient documentation

## 2013-03-28 DIAGNOSIS — Z780 Asymptomatic menopausal state: Secondary | ICD-10-CM | POA: Insufficient documentation

## 2013-03-28 DIAGNOSIS — M899 Disorder of bone, unspecified: Secondary | ICD-10-CM | POA: Diagnosis not present

## 2013-03-28 DIAGNOSIS — Z13820 Encounter for screening for osteoporosis: Secondary | ICD-10-CM

## 2013-03-29 ENCOUNTER — Encounter: Payer: Self-pay | Admitting: Family Medicine

## 2013-03-29 ENCOUNTER — Other Ambulatory Visit: Payer: Self-pay | Admitting: Family Medicine

## 2013-03-29 DIAGNOSIS — M810 Age-related osteoporosis without current pathological fracture: Secondary | ICD-10-CM | POA: Insufficient documentation

## 2013-03-31 ENCOUNTER — Other Ambulatory Visit: Payer: Self-pay

## 2013-03-31 MED ORDER — CALCIUM CARBONATE-VITAMIN D 600-400 MG-UNIT PO TABS: 1.0000 | ORAL_TABLET | Freq: Every day | ORAL | Status: DC

## 2013-03-31 MED ORDER — ALENDRONATE SODIUM 70 MG PO TABS: 70.0000 mg | ORAL_TABLET | ORAL | Status: DC

## 2013-04-24 NOTE — Assessment & Plan Note (Signed)
Resolved, pt now compliant with GI , patient encouraged strongly to continue directed mx through GI

## 2013-04-24 NOTE — Assessment & Plan Note (Signed)
normalized HBA1C in May 2014, however with weight gain needs to be rechecked next visit. Patient educated about the importance of limiting  Carbohydrate intake , the need to commit to daily physical activity for a minimum of 30 minutes , and to commit weight loss. The fact that changes in all these areas will reduce or eliminate all together the development of diabetes is stressed.

## 2013-04-24 NOTE — Assessment & Plan Note (Signed)
Hyperlipidemia:Low fat diet discussed and encouraged.  Use of statin contra indicated diue to intolerance of prescription meds and h/o transaminitis

## 2013-04-24 NOTE — Assessment & Plan Note (Signed)
Current ly in therapy at [facility] and much improved, no medication required

## 2013-04-24 NOTE — Assessment & Plan Note (Signed)
Pt to remain p on PPI and f/u with ENT

## 2013-04-24 NOTE — Assessment & Plan Note (Signed)
Uncontrolled due to medical non compliance. DASH diet and commitment to daily physical activity for a minimum of 30 minutes discussed and encouraged, as a part of hypertension management. The importance of attaining a healthy weight is also discussed.  

## 2013-05-18 ENCOUNTER — Encounter: Payer: Self-pay | Admitting: Family Medicine

## 2013-05-18 ENCOUNTER — Ambulatory Visit (INDEPENDENT_AMBULATORY_CARE_PROVIDER_SITE_OTHER): Payer: Medicare Other | Admitting: Family Medicine

## 2013-05-18 VITALS — BP 158/90 | HR 86 | Resp 18 | Ht 65.0 in | Wt 171.0 lb

## 2013-05-18 DIAGNOSIS — F32A Depression, unspecified: Secondary | ICD-10-CM

## 2013-05-18 DIAGNOSIS — R7309 Other abnormal glucose: Secondary | ICD-10-CM | POA: Diagnosis not present

## 2013-05-18 DIAGNOSIS — S8990XA Unspecified injury of unspecified lower leg, initial encounter: Secondary | ICD-10-CM

## 2013-05-18 DIAGNOSIS — S99929A Unspecified injury of unspecified foot, initial encounter: Secondary | ICD-10-CM | POA: Insufficient documentation

## 2013-05-18 DIAGNOSIS — E785 Hyperlipidemia, unspecified: Secondary | ICD-10-CM

## 2013-05-18 DIAGNOSIS — F3289 Other specified depressive episodes: Secondary | ICD-10-CM

## 2013-05-18 DIAGNOSIS — E663 Overweight: Secondary | ICD-10-CM

## 2013-05-18 DIAGNOSIS — J301 Allergic rhinitis due to pollen: Secondary | ICD-10-CM | POA: Diagnosis not present

## 2013-05-18 DIAGNOSIS — M810 Age-related osteoporosis without current pathological fracture: Secondary | ICD-10-CM

## 2013-05-18 DIAGNOSIS — I10 Essential (primary) hypertension: Secondary | ICD-10-CM | POA: Diagnosis not present

## 2013-05-18 DIAGNOSIS — F329 Major depressive disorder, single episode, unspecified: Secondary | ICD-10-CM

## 2013-05-18 DIAGNOSIS — S99919A Unspecified injury of unspecified ankle, initial encounter: Secondary | ICD-10-CM | POA: Diagnosis not present

## 2013-05-18 DIAGNOSIS — R7303 Prediabetes: Secondary | ICD-10-CM

## 2013-05-18 MED ORDER — OLMESARTAN MEDOXOMIL 40 MG PO TABS: ORAL_TABLET | ORAL | Status: DC

## 2013-05-18 MED ORDER — FLUTICASONE PROPIONATE 50 MCG/ACT NA SUSP: 1.0000 | Freq: Every day | NASAL | Status: DC

## 2013-05-18 NOTE — Patient Instructions (Signed)
F/u mid March , please call if you need me before  New higher dose of benicar is 40mg  ONE daily, OK to take TWO 20mg  benicar tablets once daily together till done  Continue clonidine half at bedtime, refills will be sent in New for allergies is flonase spray, buy claritin at [facility], also called loratidine , one daily for allergies, continue saline nose flushes   X ray of toe is ordered , pls get as soon as able  HBA1C and chem 7 non fast 3 days before follow up

## 2013-05-25 ENCOUNTER — Ambulatory Visit (HOSPITAL_COMMUNITY)
Admission: RE | Admit: 2013-05-25 | Discharge: 2013-05-25 | Disposition: A | Discharge status: Home / Self Care | Payer: Medicare Other | Source: Ambulatory Visit | Attending: Family Medicine | Admitting: Family Medicine

## 2013-05-25 ENCOUNTER — Other Ambulatory Visit: Payer: Self-pay | Admitting: Family Medicine

## 2013-05-25 DIAGNOSIS — W2203XA Walked into furniture, initial encounter: Secondary | ICD-10-CM | POA: Insufficient documentation

## 2013-05-25 DIAGNOSIS — S99929A Unspecified injury of unspecified foot, initial encounter: Secondary | ICD-10-CM

## 2013-05-25 DIAGNOSIS — M79609 Pain in unspecified limb: Secondary | ICD-10-CM | POA: Insufficient documentation

## 2013-05-25 DIAGNOSIS — S92919A Unspecified fracture of unspecified toe(s), initial encounter for closed fracture: Secondary | ICD-10-CM

## 2013-05-25 DIAGNOSIS — S8990XA Unspecified injury of unspecified lower leg, initial encounter: Secondary | ICD-10-CM | POA: Diagnosis not present

## 2013-05-25 DIAGNOSIS — M7989 Other specified soft tissue disorders: Secondary | ICD-10-CM | POA: Diagnosis not present

## 2013-05-25 DIAGNOSIS — S99919A Unspecified injury of unspecified ankle, initial encounter: Secondary | ICD-10-CM | POA: Diagnosis not present

## 2013-06-02 DIAGNOSIS — F3289 Other specified depressive episodes: Secondary | ICD-10-CM | POA: Diagnosis not present

## 2013-06-02 DIAGNOSIS — F329 Major depressive disorder, single episode, unspecified: Secondary | ICD-10-CM | POA: Diagnosis not present

## 2013-06-03 ENCOUNTER — Telehealth: Payer: Self-pay | Admitting: Family Medicine

## 2013-06-03 NOTE — Telephone Encounter (Signed)
Noted, do not refer then please

## 2013-06-05 NOTE — Assessment & Plan Note (Signed)
Uncontrolled, dose increase in benicar DASH diet and commitment to daily physical activity for a minimum of 30 minutes discussed and encouraged, as a part of hypertension management. The importance of attaining a healthy weight is also discussed.

## 2013-06-05 NOTE — Assessment & Plan Note (Signed)
Updated lab needed at/ before next visit. Hyperlipidemia:Low fat diet discussed and encouraged.   

## 2013-06-05 NOTE — Assessment & Plan Note (Signed)
Pt refused podiatry eval, should re consider specific treatment of her established dx of osteoperosis, will discuss at next

## 2013-06-05 NOTE — Progress Notes (Signed)
Subjective:    Patient ID: Yolanda Foley, female    DOB: Dec 29, 1940, 73 y.o.   MRN: [id number]  HPI The PT is here for follow up and re-evaluation of chronic medical conditions, medication management and review of any available recent lab and radiology data.  Preventive health is updated, specifically  Cancer screening and Immunization.   The PT denies any adverse reactions to current medications since the last visit.  Injured her left 3rd  toe several weeks ago, still has pain and limitation in weight bearing, believes it is fractured. Ankle has subsequently been painful due to alteration in her walk. Still remains stressed , because of her husband's behavior, not only somewhat domineering per her report, but also has up to present not comited to regular health care, choosing to rely on "natural remedies", Has upcoming appt in the near future which is somewhat comforting. Chronic cough unchanged, no sputum , fever or chills, increased, and chronic clear nasal drainage , anterior and posterior     Review of Systems See HPI Denies recent fever or chills. Denies sinus pressure, ear pain or sore throat. Denies chest congestion, productive cough or wheezing. Denies chest pains, palpitations and leg swelling Denies abdominal pain, nausea, vomiting,diarrhea or constipation.   Denies dysuria, frequency, hesitancy or incontinence.  Denies headaches, seizures, numbness, or tingling.  Denies skin break down or rash.        Objective:   Physical Exam  BP 158/90  Pulse 86  Resp 18  Ht 5\' 5"  (1.651 m)  Wt 171 lb (77.565 kg)  BMI 28.46 kg/m2  SpO2 98% Patient alert and oriented and in no cardiopulmonary distress.  HEENT: No facial asymmetry, EOMI, no sinus tenderness,  oropharynx pink and moist.  Neck supple no adenopathy.TM clear , mild erythema of nasal mucosa, clear anterior nasal drainage  Chest: Clear to auscultation bilaterally.  CVS: S1, S2 no murmurs, no S3.  ABD: Soft  non tender. Bowel sounds normal.  Ext: No edema  MS: Adequate ROM spine, shoulders, hips and knees.Tender over 3rd left toe proximal joint  Skin: Intact, no ulcerations or rash noted.  Psych: Good eye contact, normal affect. Memory intact not anxious or depressed appearing.  CNS: CN 2-12 intact, power, tone and sensation normal throughout.       Assessment & Plan:  HYPERTENSION Uncontrolled, dose increase in benicar DASH diet and commitment to daily physical activity for a minimum of 30 minutes discussed and encouraged, as a part of hypertension management. The importance of attaining a healthy weight is also discussed.   Prediabetes Updated lab needed at/ before next visit. Patient educated about the importance of limiting  Carbohydrate intake , the need to commit to daily physical activity for a minimum of 30 minutes , and to commit weight loss. The fact that changes in all these areas will reduce or eliminate all together the development of diabetes is stressed.     Depression benfitting somewhat from her therapist, but feels she could get more if the therapist was more assetrive. States her spouse is now going to start seeking medical are which is a relief  HYPERLIPIDEMIA Updated lab needed at/ before next visit. Hyperlipidemia:Low fat diet discussed and encouraged.    OVERWEIGHT Unchnaged Patient re-educated about  the importance of commitment to a  minimum of 150 minutes of exercise per week. The importance of healthy food choices with portion control discussed. Encouraged to start a food diary, count calories and to consider  joining  a support group. Sample diet sheets offered. Goals set by the patient for the next several months.     Toe trauma Pt refused podiatry eval, should re consider specific treatment of her established dx of osteoperosis, will discuss at next   Osteoporosis, unspecified Recent minor trauma caused fracture of toe. Pt needs to take both  calcium and bone builder regularly

## 2013-06-05 NOTE — Assessment & Plan Note (Signed)
Updated lab needed at/ before next visit. Patient educated about the importance of limiting  Carbohydrate intake , the need to commit to daily physical activity for a minimum of 30 minutes , and to commit weight loss. The fact that changes in all these areas will reduce or eliminate all together the development of diabetes is stressed.    

## 2013-06-05 NOTE — Assessment & Plan Note (Signed)
Unchnaged. Patient re-educated about  the importance of commitment to a  minimum of 150 minutes of exercise per week. The importance of healthy food choices with portion control discussed. Encouraged to start a food diary, count calories and to consider  joining a support group. Sample diet sheets offered. Goals set by the patient for the next several months.    

## 2013-06-05 NOTE — Assessment & Plan Note (Signed)
benfitting somewhat from her therapist, but feels she could get more if the therapist was more assetrive. States her spouse is now going to start seeking medical are which is a relief

## 2013-06-05 NOTE — Assessment & Plan Note (Signed)
Recent minor trauma caused fracture of toe. Pt needs to take both calcium and bone builder regularly

## 2013-06-30 DIAGNOSIS — F329 Major depressive disorder, single episode, unspecified: Secondary | ICD-10-CM | POA: Diagnosis not present

## 2013-06-30 DIAGNOSIS — F3289 Other specified depressive episodes: Secondary | ICD-10-CM | POA: Diagnosis not present

## 2013-07-04 ENCOUNTER — Ambulatory Visit (INDEPENDENT_AMBULATORY_CARE_PROVIDER_SITE_OTHER): Payer: Self-pay | Admitting: Internal Medicine

## 2013-07-11 DIAGNOSIS — R7309 Other abnormal glucose: Secondary | ICD-10-CM | POA: Diagnosis not present

## 2013-07-11 DIAGNOSIS — I10 Essential (primary) hypertension: Secondary | ICD-10-CM | POA: Diagnosis not present

## 2013-07-11 LAB — BASIC METABOLIC PANEL
BUN: 11 mg/dL (ref 6–23)
CALCIUM: 9.1 mg/dL (ref 8.4–10.5)
CO2: 29 mEq/L (ref 19–32)
Chloride: 101 mEq/L (ref 96–112)
Creat: 0.67 mg/dL (ref 0.50–1.10)
GLUCOSE: 83 mg/dL (ref 70–99)
Potassium: 4 mEq/L (ref 3.5–5.3)
Sodium: 137 mEq/L (ref 135–145)

## 2013-07-11 LAB — HEMOGLOBIN A1C
HEMOGLOBIN A1C: 5.4 % (ref <5.7)
Mean Plasma Glucose: 108 mg/dL (ref <117)

## 2013-07-13 ENCOUNTER — Ambulatory Visit (INDEPENDENT_AMBULATORY_CARE_PROVIDER_SITE_OTHER): Payer: Medicare Other | Admitting: Family Medicine

## 2013-07-13 ENCOUNTER — Encounter: Payer: Self-pay | Admitting: Family Medicine

## 2013-07-13 VITALS — BP 162/86 | HR 82 | Resp 18 | Ht 65.0 in | Wt 171.1 lb

## 2013-07-13 DIAGNOSIS — E785 Hyperlipidemia, unspecified: Secondary | ICD-10-CM

## 2013-07-13 DIAGNOSIS — B351 Tinea unguium: Secondary | ICD-10-CM

## 2013-07-13 DIAGNOSIS — R7303 Prediabetes: Secondary | ICD-10-CM

## 2013-07-13 DIAGNOSIS — F32A Depression, unspecified: Secondary | ICD-10-CM

## 2013-07-13 DIAGNOSIS — F3289 Other specified depressive episodes: Secondary | ICD-10-CM

## 2013-07-13 DIAGNOSIS — Z91199 Patient's noncompliance with other medical treatment and regimen due to unspecified reason: Secondary | ICD-10-CM

## 2013-07-13 DIAGNOSIS — R498 Other voice and resonance disorders: Secondary | ICD-10-CM | POA: Diagnosis not present

## 2013-07-13 DIAGNOSIS — R6889 Other general symptoms and signs: Secondary | ICD-10-CM

## 2013-07-13 DIAGNOSIS — Z7189 Other specified counseling: Secondary | ICD-10-CM

## 2013-07-13 DIAGNOSIS — R7309 Other abnormal glucose: Secondary | ICD-10-CM

## 2013-07-13 DIAGNOSIS — I10 Essential (primary) hypertension: Secondary | ICD-10-CM

## 2013-07-13 DIAGNOSIS — Z9119 Patient's noncompliance with other medical treatment and regimen: Secondary | ICD-10-CM

## 2013-07-13 DIAGNOSIS — R196 Halitosis: Secondary | ICD-10-CM

## 2013-07-13 DIAGNOSIS — Z9114 Patient's other noncompliance with medication regimen: Secondary | ICD-10-CM

## 2013-07-13 DIAGNOSIS — R5381 Other malaise: Secondary | ICD-10-CM

## 2013-07-13 DIAGNOSIS — R5383 Other fatigue: Secondary | ICD-10-CM

## 2013-07-13 DIAGNOSIS — K219 Gastro-esophageal reflux disease without esophagitis: Secondary | ICD-10-CM

## 2013-07-13 DIAGNOSIS — F329 Major depressive disorder, single episode, unspecified: Secondary | ICD-10-CM

## 2013-07-13 NOTE — Patient Instructions (Addendum)
Physical exam in 4 month, call if you need me before  Congrats on excellent labs  You absolutely NEED to take your benicar every day, stop clonidine.Your blood pressure remains high   Schedule your mammogram, please  You will be referred to ENT in [facility] re halitosis and chronic hoarseness  Allergy medication needs to be taken every day  Fall Prevention and Home Safety Falls cause injuries and can affect all age groups. It is possible to prevent falls.  HOW TO PREVENT FALLS  Wear shoes with rubber soles that do not have an opening for your toes.  Keep the inside and outside of your house well lit.  Use night lights throughout your home.  Remove clutter from floors.  Clean up floor spills.  Remove throw rugs or fasten them to the floor with carpet tape.  Do not place electrical cords across pathways.  Put grab bars by your tub, shower, and toilet. Do not use towel bars as grab bars.  Put handrails on both sides of the stairway. Fix loose handrails.  Do not climb on stools or stepladders, if possible.  Do not wax your floors.  Repair uneven or unsafe sidewalks, walkways, or stairs.  Keep items you use a lot within reach.  Be aware of pets.  Keep emergency numbers next to the telephone.  Put smoke detectors in your home and near bedrooms. Ask your doctor what other things you can do to prevent falls. Document Released: 02/08/2009 Document Revised: 10/14/2011 Document Reviewed: 07/15/2011 [facility] [facility] [employer] Patient Information 2014 [place], [place].

## 2013-07-13 NOTE — Progress Notes (Signed)
Subjective:    Patient ID: Yolanda Foley, female    DOB: 1940-06-08, 73 y.o.   MRN: [id number]  HPI The PT is here for follow up and re-evaluation of chronic medical conditions, medication management and review of any available recent lab and radiology data.  Preventive health is updated, specifically  Cancer screening and Immunization.   The PT denies states she cannot take clonidine , makes her feel even more tired than she usually does, not taking either BP med consistently, so I have advised her to d/c the clonidine. C/o thickness and discoloration of left thumb nail for months, wants help for this. C/o chronic bad breath, wants a 2nd ENT opinion, also has h/o thyroid nodules and chronic hoarseness form reflux, referral will be done         Review of Systems See HPI Denies recent fever or chills. Denies sinus pressure, increased  nasal congestion with season change and post nasal drip,denies  ear pain or sore throat. Denies chest congestion, productive cough or wheezing. Denies chest pains, palpitations and leg swelling Denies abdominal pain, nausea, vomiting,diarrhea or constipation.   Denies dysuria, frequency, hesitancy or incontinence. Denies joint pain, swelling and limitation in mobility. Denies headaches, seizures, numbness, or tingling. . Denies skin break down or rash.        Objective:   Physical Exam BP 162/86  Pulse 82  Resp 18  Ht 5\' 5"  (1.651 m)  Wt 171 lb 1.9 oz (77.62 kg)  BMI 28.48 kg/m2  SpO2 96% Patient alert and oriented and in no cardiopulmonary distress.  HEENT: No facial asymmetry, EOMI, no sinus tenderness,  oropharynx pink and moist.  Neck supple no adenopathy.  Chest: Clear to auscultation bilaterally.  CVS: S1, S2 no murmurs, no S3.  ABD: Soft non tender. Bowel sounds normal.  Ext: No edema  MS: Adequate ROM spine, shoulders, hips and knees.  Skin: Intact, no ulcerations or rash noted.fungal infection of left thumb nail involving  approximately 33% of the digit  Psych: Good eye contact, normal affect. Memory intact not anxious or depressed appearing.  CNS: CN 2-12 intact, power, tone and sensation normal throughout.        Assessment & Plan:  HYPERTENSION Remains uncontrolled, pt continues to take medication irregularly, as she "feels she needs it" States clonidine makes her sleepy, I advised her to discontinue this and commit to daily benicar, states she will  5 mins spent re educating her about silent organ damage due to uncontrolled HTN, threatening her quality of life and length of life  GERD chronicnand questionable control pt has persistent chronic painless hoarseness from uncontrolled GERD, referred to ENT  Prediabetes Marked improvement , pt applauded on this and encouraged to continue same Patient educated about the importance of limiting  Carbohydrate intake , the need to commit to daily physical activity for a minimum of 30 minutes , and to commit weight loss. The fact that changes in all these areas will reduce or eliminate all together the development of diabetes is stressed.     FATIGUE Chronic fatigue persists,little better from psychotherapy. Continues to c/o controlling spouse who discourages use of medication for chronic disease  HYPERLIPIDEMIA Hyperlipidemia:Low fat diet discussed and encouraged.  Updated lab in August , unable to take statins due to liver disease  Depression Would likely benfit from low dose SSRI however no interest currently, not suicidal opr omicidal, hjas been on medication in the past. Feels as though the new season will herald improved  mental health  HOARSENESS, CHRONIC Persist alos c/o halitosis, wants 2nd ENT opinion on both conditions , referral entered  Encounter for home safety review for injury prevention Reduction in fall risk and safety in the home discussed , and literature  provided on the discharge instruction sheet as well.   Non compliance w  medication regimen Continues to take antihypertensive medication as she feels necessary, with persistent uncontrolled HTN, associated organ damage and stroke risk discussed with pt , states she will become consistent with meds  Onychomycosis Fungal infection of left thumb nail, advised topical treatment for a prolonged period, unable to take oral med due to liver disease

## 2013-07-14 ENCOUNTER — Encounter: Payer: Self-pay | Admitting: Family Medicine

## 2013-07-17 DIAGNOSIS — B351 Tinea unguium: Secondary | ICD-10-CM | POA: Insufficient documentation

## 2013-07-17 DIAGNOSIS — Z7189 Other specified counseling: Secondary | ICD-10-CM | POA: Insufficient documentation

## 2013-07-17 DIAGNOSIS — Z91148 Patient's other noncompliance with medication regimen for other reason: Secondary | ICD-10-CM | POA: Insufficient documentation

## 2013-07-17 DIAGNOSIS — Z9114 Patient's other noncompliance with medication regimen: Secondary | ICD-10-CM | POA: Insufficient documentation

## 2013-07-17 NOTE — Assessment & Plan Note (Signed)
Remains uncontrolled, pt continues to take medication irregularly, as she "feels she needs it" States clonidine makes her sleepy, I advised her to discontinue this and commit to daily benicar, states she will  5 mins spent re educating her about silent organ damage due to uncontrolled HTN, threatening her quality of life and length of life

## 2013-07-17 NOTE — Assessment & Plan Note (Signed)
Fungal infection of left thumb nail, advised topical treatment for a prolonged period, unable to take oral med due to liver disease

## 2013-07-17 NOTE — Assessment & Plan Note (Signed)
Continues to take antihypertensive medication as she feels necessary, with persistent uncontrolled HTN, associated organ damage and stroke risk discussed with pt , states she will become consistent with meds

## 2013-07-17 NOTE — Assessment & Plan Note (Signed)
Hyperlipidemia:Low fat diet discussed and encouraged.  Updated lab in August , unable to take statins due to liver disease

## 2013-07-17 NOTE — Assessment & Plan Note (Signed)
Persist alos c/o halitosis, wants 2nd ENT opinion on both conditions , referral entered

## 2013-07-17 NOTE — Assessment & Plan Note (Signed)
Reduction in fall risk and safety in the home discussed , and literature  provided on the discharge instruction sheet as well.  

## 2013-07-17 NOTE — Assessment & Plan Note (Signed)
Would likely benfit from low dose SSRI however no interest currently, not suicidal opr omicidal, hjas been on medication in the past. Feels as though the new season will herald improved mental health

## 2013-07-17 NOTE — Assessment & Plan Note (Signed)
Chronic fatigue persists,little better from psychotherapy. Continues to c/o controlling spouse who discourages use of medication for chronic disease

## 2013-07-17 NOTE — Assessment & Plan Note (Signed)
Marked improvement , pt applauded on this and encouraged to continue same Patient educated about the importance of limiting  Carbohydrate intake , the need to commit to daily physical activity for a minimum of 30 minutes , and to commit weight loss. The fact that changes in all these areas will reduce or eliminate all together the development of diabetes is stressed.

## 2013-07-17 NOTE — Assessment & Plan Note (Signed)
chronicnand questionable control pt has persistent chronic painless hoarseness from uncontrolled GERD, referred to ENT

## 2013-07-25 DIAGNOSIS — R499 Unspecified voice and resonance disorder: Secondary | ICD-10-CM | POA: Diagnosis not present

## 2013-07-25 DIAGNOSIS — R6889 Other general symptoms and signs: Secondary | ICD-10-CM | POA: Diagnosis not present

## 2013-07-25 DIAGNOSIS — K219 Gastro-esophageal reflux disease without esophagitis: Secondary | ICD-10-CM | POA: Diagnosis not present

## 2013-07-26 DIAGNOSIS — H538 Other visual disturbances: Secondary | ICD-10-CM | POA: Diagnosis not present

## 2013-07-26 DIAGNOSIS — H43819 Vitreous degeneration, unspecified eye: Secondary | ICD-10-CM | POA: Diagnosis not present

## 2013-07-26 DIAGNOSIS — H26019 Infantile and juvenile cortical, lamellar, or zonular cataract, unspecified eye: Secondary | ICD-10-CM | POA: Diagnosis not present

## 2013-07-26 DIAGNOSIS — H04129 Dry eye syndrome of unspecified lacrimal gland: Secondary | ICD-10-CM | POA: Diagnosis not present

## 2013-08-01 ENCOUNTER — Other Ambulatory Visit (INDEPENDENT_AMBULATORY_CARE_PROVIDER_SITE_OTHER): Payer: Self-pay | Admitting: Internal Medicine

## 2013-08-01 DIAGNOSIS — K219 Gastro-esophageal reflux disease without esophagitis: Secondary | ICD-10-CM

## 2013-08-01 MED ORDER — OMEPRAZOLE 20 MG PO CPDR: 20.0000 mg | DELAYED_RELEASE_CAPSULE | Freq: Two times a day (BID) | ORAL | Status: DC

## 2013-08-04 DIAGNOSIS — F329 Major depressive disorder, single episode, unspecified: Secondary | ICD-10-CM | POA: Diagnosis not present

## 2013-08-04 DIAGNOSIS — F3289 Other specified depressive episodes: Secondary | ICD-10-CM | POA: Diagnosis not present

## 2013-08-08 ENCOUNTER — Encounter (INDEPENDENT_AMBULATORY_CARE_PROVIDER_SITE_OTHER): Payer: Self-pay | Admitting: Internal Medicine

## 2013-08-08 ENCOUNTER — Ambulatory Visit (INDEPENDENT_AMBULATORY_CARE_PROVIDER_SITE_OTHER): Payer: Medicare Other | Admitting: Internal Medicine

## 2013-08-08 VITALS — BP 128/70 | HR 72 | Temp 98.7°F | Resp 18 | Ht 65.0 in | Wt 173.3 lb

## 2013-08-08 DIAGNOSIS — K745 Biliary cirrhosis, unspecified: Secondary | ICD-10-CM | POA: Diagnosis not present

## 2013-08-08 DIAGNOSIS — K743 Primary biliary cirrhosis: Secondary | ICD-10-CM

## 2013-08-08 NOTE — Progress Notes (Signed)
Presenting complaint;  Followup for PBC.  Subjective:  Patient is 73 year old African female who is here for scheduled visit. Last visit was in September 2014. 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 He declined to have a liver biopsy but her stage was felt to be early since she did not have stigmata of chronic liver disease or portal hypertension. Early on she was not taking Urso as recommended. She says she is taking this medication as recommended. She is not having any side effects. She was sent over prognosis. She has good appetite. Her weight is up by 4 pounds since her last visit. She denies abdominal pain pruritus or fatigue.   Current Medications: Outpatient Encounter Prescriptions as of 08/08/2013  Medication Sig  . aspirin (ASPIRIN LOW DOSE) 81 MG EC tablet Take 81 mg by mouth as needed. Take one tablet by mouth once a day  . Calcium Carbonate-Vitamin D (CALCIUM 600-D) 600-400 MG-UNIT per tablet Take 1 tablet by mouth daily.  . Chlorophyll 50 MG CAPS Take 3 capsules by mouth daily.  . Coenzyme Q10-Fish Oil-Vit E (CO-Q 10 OMEGA-3 FISH OIL) CAPS Take 2 capsules by mouth daily.  . Digestive Enzymes (PAPAYA ENZYME) CHEW Chew by mouth as needed.  Nyoka Cowden Tea, Camillia sinensis, (GREEN TEA PO) Take by mouth. Patient states that this is a decaf tea and she drinks it 1-2 times daily  . Methylsulfonylmethane (MSM) 500 MG CAPS Take by mouth 2 (two) times daily.  . Multiple Vitamin (MULTIVITAMIN PO) Take by mouth daily.    Marland Kitchen olmesartan (BENICAR) 40 MG tablet One tablet once daily  . omeprazole (PRILOSEC) 20 MG capsule Take 1 capsule (20 mg total) by mouth 2 (two) times daily.  Marland Kitchen PAPAYA PO Take by mouth.  . ursodiol (ACTIGALL) 300 MG capsule Take 1 capsule (300 mg total) by mouth 3 (three) times daily after meals.  . [DISCONTINUED] alendronate (FOSAMAX) 70 MG tablet Take 1 tablet (70  mg total) by mouth once a week. Take with a full glass of water on an empty stomach.  . [DISCONTINUED] cloNIDine (CATAPRES) 0.1 MG tablet One tablet at bedtime for high blood pressure  . [DISCONTINUED] fluticasone (FLONASE) 50 MCG/ACT nasal spray Place 1 spray into both nostrils daily.     Objective: Blood pressure 128/70, pulse 72, temperature 98.7 F (37.1 C), temperature source Oral, resp. rate 18, height 5\' 5"  (1.651 m), weight 173 lb 4.8 oz (78.608 kg). Patient is alert and in no acute distress. Conjunctiva is pink. Sclera is nonicteric Oropharyngeal mucosa is normal. No neck masses or thyromegaly noted. Cardiac exam with regular rhythm normal S1 and S2. No murmur or gallop noted. Lungs are clear to auscultation. Abdomen symmetrical soft and nontender without organomegaly or masses.  No LE edema or clubbing noted.  Labs/studies Results: LFTs from 02/28/2013. 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 She remains without stigmata of chronic liver disease. She is suspected to have early-stage disease and plan good long-term prognosis. Her transaminases are normal and alkaline phosphatase is near-normal. We should be able to stage her disease with US/elastography.   Plan:  Continue Urso 300 mg by mouth 3 times a day. [place] with elastography and LFTs in July 2015. Office visit in one year.

## 2013-08-08 NOTE — Patient Instructions (Signed)
Ultrasound and LFTs to be done in the first week of July 2015.

## 2013-08-09 ENCOUNTER — Telehealth: Payer: Self-pay | Admitting: Family Medicine

## 2013-08-09 ENCOUNTER — Telehealth: Payer: Self-pay

## 2013-08-09 DIAGNOSIS — N644 Mastodynia: Secondary | ICD-10-CM

## 2013-08-09 NOTE — Telephone Encounter (Signed)
fyi

## 2013-08-10 NOTE — Telephone Encounter (Signed)
noted 

## 2013-08-16 DIAGNOSIS — N644 Mastodynia: Secondary | ICD-10-CM | POA: Diagnosis not present

## 2013-08-22 DIAGNOSIS — H269 Unspecified cataract: Secondary | ICD-10-CM | POA: Diagnosis not present

## 2013-08-22 DIAGNOSIS — H26019 Infantile and juvenile cortical, lamellar, or zonular cataract, unspecified eye: Secondary | ICD-10-CM | POA: Diagnosis not present

## 2013-08-22 DIAGNOSIS — H2589 Other age-related cataract: Secondary | ICD-10-CM | POA: Diagnosis not present

## 2013-08-24 ENCOUNTER — Encounter (INDEPENDENT_AMBULATORY_CARE_PROVIDER_SITE_OTHER): Payer: Self-pay | Admitting: *Deleted

## 2013-08-24 ENCOUNTER — Other Ambulatory Visit (INDEPENDENT_AMBULATORY_CARE_PROVIDER_SITE_OTHER): Payer: Self-pay | Admitting: *Deleted

## 2013-08-24 DIAGNOSIS — R740 Nonspecific elevation of levels of transaminase and lactic acid dehydrogenase [LDH]: Secondary | ICD-10-CM

## 2013-08-24 DIAGNOSIS — K743 Primary biliary cirrhosis: Secondary | ICD-10-CM

## 2013-08-24 DIAGNOSIS — R7401 Elevation of levels of liver transaminase levels: Secondary | ICD-10-CM

## 2013-09-14 ENCOUNTER — Encounter: Payer: Self-pay | Admitting: Family Medicine

## 2013-11-01 ENCOUNTER — Encounter (INDEPENDENT_AMBULATORY_CARE_PROVIDER_SITE_OTHER): Payer: Self-pay | Admitting: *Deleted

## 2013-11-02 ENCOUNTER — Ambulatory Visit (INDEPENDENT_AMBULATORY_CARE_PROVIDER_SITE_OTHER): Payer: Medicare Other | Admitting: Family Medicine

## 2013-11-02 ENCOUNTER — Other Ambulatory Visit (HOSPITAL_COMMUNITY)
Admission: RE | Admit: 2013-11-02 | Discharge: 2013-11-02 | Disposition: A | Discharge status: Home / Self Care | Payer: Medicare Other | Source: Ambulatory Visit | Attending: Family Medicine | Admitting: Family Medicine

## 2013-11-02 ENCOUNTER — Encounter: Payer: Self-pay | Admitting: Family Medicine

## 2013-11-02 VITALS — BP 152/84 | HR 86 | Resp 18 | Ht 65.0 in | Wt 171.1 lb

## 2013-11-02 DIAGNOSIS — K8000 Calculus of gallbladder with acute cholecystitis without obstruction: Secondary | ICD-10-CM

## 2013-11-02 DIAGNOSIS — I10 Essential (primary) hypertension: Secondary | ICD-10-CM | POA: Diagnosis not present

## 2013-11-02 DIAGNOSIS — Z0000 Encounter for general adult medical examination without abnormal findings: Secondary | ICD-10-CM | POA: Diagnosis not present

## 2013-11-02 DIAGNOSIS — Z9119 Patient's noncompliance with other medical treatment and regimen: Secondary | ICD-10-CM

## 2013-11-02 DIAGNOSIS — Z124 Encounter for screening for malignant neoplasm of cervix: Secondary | ICD-10-CM

## 2013-11-02 DIAGNOSIS — Z9114 Patient's other noncompliance with medication regimen: Secondary | ICD-10-CM

## 2013-11-02 DIAGNOSIS — M899 Disorder of bone, unspecified: Secondary | ICD-10-CM | POA: Diagnosis not present

## 2013-11-02 DIAGNOSIS — Z1211 Encounter for screening for malignant neoplasm of colon: Secondary | ICD-10-CM

## 2013-11-02 DIAGNOSIS — M949 Disorder of cartilage, unspecified: Secondary | ICD-10-CM | POA: Diagnosis not present

## 2013-11-02 DIAGNOSIS — D259 Leiomyoma of uterus, unspecified: Secondary | ICD-10-CM

## 2013-11-02 DIAGNOSIS — K8020 Calculus of gallbladder without cholecystitis without obstruction: Secondary | ICD-10-CM

## 2013-11-02 DIAGNOSIS — R7309 Other abnormal glucose: Secondary | ICD-10-CM

## 2013-11-02 DIAGNOSIS — R7303 Prediabetes: Secondary | ICD-10-CM

## 2013-11-02 DIAGNOSIS — E785 Hyperlipidemia, unspecified: Secondary | ICD-10-CM | POA: Diagnosis not present

## 2013-11-02 DIAGNOSIS — R102 Pelvic and perineal pain unspecified side: Secondary | ICD-10-CM | POA: Insufficient documentation

## 2013-11-02 DIAGNOSIS — K8010 Calculus of gallbladder with chronic cholecystitis without obstruction: Secondary | ICD-10-CM

## 2013-11-02 DIAGNOSIS — N949 Unspecified condition associated with female genital organs and menstrual cycle: Secondary | ICD-10-CM

## 2013-11-02 DIAGNOSIS — F32A Depression, unspecified: Secondary | ICD-10-CM

## 2013-11-02 DIAGNOSIS — K745 Biliary cirrhosis, unspecified: Secondary | ICD-10-CM | POA: Diagnosis not present

## 2013-11-02 DIAGNOSIS — Z91199 Patient's noncompliance with other medical treatment and regimen due to unspecified reason: Secondary | ICD-10-CM

## 2013-11-02 DIAGNOSIS — F3289 Other specified depressive episodes: Secondary | ICD-10-CM

## 2013-11-02 DIAGNOSIS — R1032 Left lower quadrant pain: Secondary | ICD-10-CM

## 2013-11-02 DIAGNOSIS — F329 Major depressive disorder, single episode, unspecified: Secondary | ICD-10-CM

## 2013-11-02 DIAGNOSIS — R7401 Elevation of levels of liver transaminase levels: Secondary | ICD-10-CM | POA: Diagnosis not present

## 2013-11-02 LAB — HEMOCCULT GUIAC POC 1CARD (OFFICE): Fecal Occult Blood, POC: NEGATIVE

## 2013-11-02 NOTE — Patient Instructions (Addendum)
F/u in 4.5 month, for annual wellness, call if you neeed me before please  You will get info on the prenvar vaccine which is REcOMMENDED for better protection against pneumonia  You need benicar EVERY day, bP remains high  You are referred for pelvic US and abdominal US in [place]  You will be referred to a surgeon in [facility], after the [place] has been done  Fasting lipid, cmp, TSH, CBC annd Vit D early September  Pls call/get your flu vacine in September  It is important that you exercise regularly at least 30 minutes 5 times a week. If you develop chest pain, have severe difficulty breathing, or feel very tired, stop exercising immediately and seek medical attention

## 2013-11-02 NOTE — Progress Notes (Signed)
Subjective:    Patient ID: [name], female    DOB: 11/06/1940, 73 y.o.   MRN: [id number]  HPI Patient is in for pelvic and breast exam. C/o increased early satiety and bloating has established cholelithiasis , gallstone was 2.3 cm over 3 years ago. C/o excessive bloating and belching  C/o left lower pelvic pain x 3 to 5 week, denies urinary symptoms or vaginal d/c Stays "stressed" has "too much to do", misses having her therapist available as a means to de stress, denies depression Continues to take BP meds every other day as usual, denies adverse s/e from the med , just continues to be non compliant , states her BP is normal when she checks it at home     Review of Systems See HPI .    Objective:   Physical Exam BP 152/84  Pulse 86  Resp 18  Ht 5\' 5"  (1.651 m)  Wt 171 lb 1.3 oz (77.601 kg)  BMI 28.47 kg/m2  SpO2 96%  Pleasant well nourished female, alert and oriented x 3, in no cardio-pulmonary distress. Afebrile.Appears younger than stated age 56 No facial trauma or asymetry. Sinuses non tender.  EOMI, PERTL, .  External ears normal, tympanic membranes clear. Oropharynx moist, no exudate,fairly good dentition.Pt has partials. Also has a tooth with lost filling  Neck: supple, no adenopathy,JVD or thyromegaly.No bruits.  Chest: Clear to ascultation bilaterally.No crackles or wheezes. Non tender to palpation  Breast: No asymetry,no masses or lumps. No tenderness. No nipple discharge or inversion. No axillary or supraclavicular adenopathy  Cardiovascular system; Heart sounds normal,  S1 and  S2 ,no S3.  No murmur, or thrill. Apical beat not displaced Peripheral pulses normal.  Abdomen: Soft, , no organomegaly or masses.Left lower quadrant tenderness.  No bruits. Bowel sounds normal. No guarding,  or rebound.  Rectal:  Normal sphincter tone. No mass.No rectal masses.  Guaiac negative stool.  GU: External genitalia normal female genitalia , female  distribution of hair. No lesions. Urethral meatus normal in size, no  Prolapse, no lesions visibly  Present. Bladder non tender. Vagina pink and moist , with no visible lesions , discharge present . Adequate pelvic support no  cystocele or rectocele noted Cervix pink and appears healthy, no lesions or ulcerations noted, no discharge noted from os Uterus slightly enlarged , approx 8 weeks, no adnexal masses, no cervical motion  Tenderness.mild left adnexal tenderness   Musculoskeletal exam: Full ROM of spine, hips , shoulders and knees. No deformity ,swelling or crepitus noted. No muscle wasting or atrophy.   Neurologic: Cranial nerves 2 to 12 intact. Power, tone ,sensation and reflexes normal throughout. No disturbance in gait. No tremor.  Skin: Intact, no ulceration, erythema , scaling or rash noted. Pigmentation normal throughout  Psych; Normal mood and affect. Judgement and concentration normal. Mildly  anxious but not  depressed appearing         Assessment & Plan:  Encounter for annual physical exam Annual exam as documented. Counseling done  re healthy lifestyle involving commitment to 150 minutes exercise per week, heart healthy diet, and attaining healthy weight.The importance of adequate sleep also discussed.  Immunization and cancer screening needs are specifically addressed at this visit.Choses tpo wait on prevnar at this visit   Non compliance w medication regimen Ongoing problem ,non compliant with antihypertensive treatment with persistently ellevated BP, counseled once more of the need to change this behavior  Cholelithiasis sympotomatic with early satiety snd bloating, requests rept [place] and  referral to gen surg for furhter management as deemed necessary. \Of note , stone was 2.3 cm several years ago  Pelvic pain in female New onset Left lower quadrant and pelvic pain.Pt has fibroids and past h/o cyst, needs re imaging  Depression Denies overt  depression, but does report increased stress since no access to counselor who is out on maternity leave, will resume therapy as soon as she is again available

## 2013-11-02 NOTE — Assessment & Plan Note (Signed)
New onset Left lower quadrant and pelvic pain.Pt has fibroids and past h/o cyst, needs re imaging

## 2013-11-02 NOTE — Assessment & Plan Note (Signed)
Ongoing problem ,non compliant with antihypertensive treatment with persistently ellevated BP, counseled once more of the need to change this behavior

## 2013-11-02 NOTE — Assessment & Plan Note (Addendum)
Annual exam as documented. Counseling done  re healthy lifestyle involving commitment to 150 minutes exercise per week, heart healthy diet, and attaining healthy weight.The importance of adequate sleep also discussed.  Immunization and cancer screening needs are specifically addressed at this visit.Choses tpo wait on prevnar at this visit

## 2013-11-02 NOTE — Assessment & Plan Note (Addendum)
sympotomatic with early satiety snd bloating, requests rept [place] and referral to gen surg for furhter management as deemed necessary. \Of note , stone was 2.3 cm several years ago

## 2013-11-02 NOTE — Assessment & Plan Note (Signed)
Denies overt depression, but does report increased stress since no access to counselor who is out on maternity leave, will resume therapy as soon as she is again available

## 2013-11-03 LAB — HEPATIC FUNCTION PANEL
ALK PHOS: 187 U/L — AB (ref 39–117)
ALT: 33 U/L (ref 0–35)
AST: 40 U/L — AB (ref 0–37)
Albumin: 4.3 g/dL (ref 3.5–5.2)
BILIRUBIN DIRECT: 0.1 mg/dL (ref 0.0–0.3)
BILIRUBIN INDIRECT: 0.4 mg/dL (ref 0.2–1.2)
BILIRUBIN TOTAL: 0.5 mg/dL (ref 0.2–1.2)
Total Protein: 8.4 g/dL — ABNORMAL HIGH (ref 6.0–8.3)

## 2013-11-04 ENCOUNTER — Telehealth (INDEPENDENT_AMBULATORY_CARE_PROVIDER_SITE_OTHER): Payer: Self-pay | Admitting: *Deleted

## 2013-11-04 DIAGNOSIS — K743 Primary biliary cirrhosis: Secondary | ICD-10-CM

## 2013-11-04 LAB — CYTOLOGY - PAP

## 2013-11-04 NOTE — Telephone Encounter (Signed)
Per Dr.Rehman the patient will need to have labs drawn in 8 weeks. 

## 2013-11-08 ENCOUNTER — Other Ambulatory Visit (HOSPITAL_COMMUNITY): Payer: Self-pay

## 2013-11-08 DIAGNOSIS — N949 Unspecified condition associated with female genital organs and menstrual cycle: Secondary | ICD-10-CM | POA: Diagnosis not present

## 2013-11-08 DIAGNOSIS — D252 Subserosal leiomyoma of uterus: Secondary | ICD-10-CM | POA: Diagnosis not present

## 2013-11-08 DIAGNOSIS — K8020 Calculus of gallbladder without cholecystitis without obstruction: Secondary | ICD-10-CM | POA: Diagnosis not present

## 2013-11-08 DIAGNOSIS — D251 Intramural leiomyoma of uterus: Secondary | ICD-10-CM | POA: Diagnosis not present

## 2013-11-08 DIAGNOSIS — R1032 Left lower quadrant pain: Secondary | ICD-10-CM | POA: Diagnosis not present

## 2013-11-23 ENCOUNTER — Ambulatory Visit (INDEPENDENT_AMBULATORY_CARE_PROVIDER_SITE_OTHER): Payer: Medicare Other | Admitting: Surgery

## 2013-11-23 ENCOUNTER — Encounter (INDEPENDENT_AMBULATORY_CARE_PROVIDER_SITE_OTHER): Payer: Self-pay | Admitting: Surgery

## 2013-11-23 VITALS — BP 132/84 | HR 64 | Temp 97.9°F | Resp 14 | Ht 65.0 in | Wt 171.2 lb

## 2013-11-23 DIAGNOSIS — K8020 Calculus of gallbladder without cholecystitis without obstruction: Secondary | ICD-10-CM | POA: Diagnosis not present

## 2013-11-23 NOTE — Progress Notes (Signed)
General Surgery [facility]  Chief Complaint  Patient presents with  . New Evaluation    cholelithiasis - referral from Dr. Tula Nakayama, [facility]    HISTORY: Patient is a pleasant 73 year old female referred by her primary care physician to discuss management of cholelithiasis. Patient had had some left-sided abdominal discomfort. Part of her evaluation included an abdominal ultrasound which demonstrates a solitary 1.9 cm gallstone. There were no acute inflammatory changes. There is no biliary dilatation. Liver appeared normal.  Patient gives a history of rare abdominal pain radiating to the back between the shoulder blades. She has occasional bloating. She denies jaundice or acholic stools. She denies fevers or chills. She has no previous history of pancreatic or hepatic disease.  Family history is notable for cholecystectomy in the patient's daughter.  Past Medical History  Diagnosis Date  . Depression   . Chronic pain   . GERD (gastroesophageal reflux disease)   . Palpitation   . Hyperlipidemia   . Hypertension   . Nonspecific elevation of levels of transaminase or lactic acid dehydrogenase (LDH)   . Elevated liver enzymes     Current Outpatient Prescriptions  Medication Sig Dispense Refill  . aspirin (ASPIRIN LOW DOSE) 81 MG EC tablet Take 81 mg by mouth as needed. Take one tablet by mouth once a day      . Calcium Carbonate-Vitamin D (CALCIUM 600-D) 600-400 MG-UNIT per tablet Take 1 tablet by mouth daily.  90 tablet  6  . Chlorophyll 50 MG CAPS Take 3 capsules by mouth daily.      . Coenzyme Q10-Fish Oil-Vit E (CO-Q 10 OMEGA-3 FISH OIL) CAPS Take 2 capsules by mouth daily.      . Digestive Enzymes (PAPAYA ENZYME) CHEW Chew by mouth as needed.      Nyoka Cowden Tea, Camillia sinensis, (GREEN TEA PO) Take by mouth. Patient states that this is a decaf tea and she drinks it 1-2 times daily      . Methylsulfonylmethane (MSM) 500 MG CAPS Take  by mouth 2 (two) times daily.      . Multiple Vitamin (MULTIVITAMIN PO) Take by mouth daily.        Marland Kitchen olmesartan (BENICAR) 40 MG tablet One tablet once daily  30 tablet  5  . omeprazole (PRILOSEC) 20 MG capsule Take 1 capsule (20 mg total) by mouth 2 (two) times daily.  60 capsule  6  . PAPAYA PO Take by mouth.      . ursodiol (ACTIGALL) 300 MG capsule Take 1 capsule (300 mg total) by mouth 3 (three) times daily after meals.  90 capsule  11   No current facility-administered medications for this visit.    Allergies  Allergen Reactions  . [place]   . Fluoxetine Other (See Comments)    Dull headaache  . Sulfonamide Derivatives     Family History  Problem Relation Age of Onset  . Dementia Mother   . Heart failure Father   . Hypertension Father   . Cancer Brother     kidney  . Cancer Maternal Aunt     breast    History   Social History  . Marital Status: Married    Spouse Name: N/A    Number of Children: 4  . Years of Education: N/A   Occupational History  . retired     Social History Main Topics  . Smoking status: Never Smoker   . Smokeless  tobacco: Never Used  . Alcohol Use: No  . Drug Use: No  . Sexual Activity: None   Other Topics Concern  . None   Social History Narrative  . None    REVIEW OF SYSTEMS - PERTINENT POSITIVES ONLY: Rare abdominal discomfort with radiation to the back. Abdominal bloating. Denies jaundice. Denies acholic stools. Denies fevers and chills.  EXAM: Filed Vitals:   11/23/13 1124  BP: 132/84  Pulse: 64  Temp: 97.9 F (36.6 C)  Resp: 14    GENERAL: well-developed, well-nourished, no acute distress HEENT: normocephalic; pupils equal and reactive; sclerae clear; dentition good; mucous membranes moist NECK:  No palpable masses in the thyroid bed; symmetric on extension; no palpable anterior or posterior cervical lymphadenopathy; no supraclavicular masses; no tenderness CHEST: clear to auscultation bilaterally  without rales, rhonchi, or wheezes CARDIAC: regular rate and rhythm without significant murmur; peripheral pulses are full ABDOMEN: soft without distension; bowel sounds present; no mass; no hepatosplenomegaly; no hernia EXT:  non-tender without edema; no deformity NEURO: no gross focal deficits; no sign of tremor   LABORATORY RESULTS: See [facility] (CHL-Epic) for most recent results  RADIOLOGY RESULTS: See [facility] (CHL-Epic) for most recent results  IMPRESSION: Cholelithiasis, minimally symptomatic  PLAN: I discussed the above findings at length with the patient. I provided her with written literature to review at home.  Patient and I discussed laparoscopic cholecystectomy. We discussed the hospital stay to be anticipated. We discussed potential complications. The patient does not wish to proceed with any surgery at the present time. We discussed symptoms that may develop and possible complications that may develop. We agreed that if she became more symptomatic, she would return for further evaluation and possible cholecystectomy at that time.  Patient will return for surgical care as needed.  Earnstine Regal, MD, [facility]  Primary Care Physician: Tula Nakayama, MD

## 2013-11-23 NOTE — Patient Instructions (Signed)
  [facility]  LAPAROSCOPIC SURGERY - POST-OP INSTRUCTIONS  Always review your discharge instruction sheet given to you by the facility where your surgery was performed.  A prescription for pain medication may be given to you upon discharge.  Take your pain medication as prescribed.  If narcotic pain medicine is not needed, then you may take acetaminophen (Tylenol) or ibuprofen (Advil) as needed.  Take your usually prescribed medications unless otherwise directed.  If you need a refill on your pain medication, please contact your pharmacy.  They will contact our office to request authorization. Prescriptions will not be filled after 5 P.M. or on weekends.  You should follow a light diet the first few days after arrival home, such as soup and crackers or toast.  Be sure to include plenty of fluids daily.  Most patients will experience some swelling and bruising in the area of the incisions.  Ice packs will help.  Swelling and bruising can take several days to resolve.   It is common to experience some constipation if taking pain medication after surgery.  Increasing fluid intake and taking a stool softener (such as Colace) will usually help or prevent this problem from occurring.  A mild laxative (Milk of Magnesia or Miralax) should be taken according to package instructions if there are no bowel movements after 48 hours.  Unless discharge instructions indicate otherwise, you may remove your bandages 24-48 hours after surgery, and you may shower at that time.  You may have steri-strips (small skin tapes) in place directly over the incision.  These strips should be left on the skin for 7-10 days.  If your surgeon used skin glue on the incision, you may shower in 24 hours.  The glue will flake off over the next 2-3 weeks.  Any sutures or staples will be removed at the office during your follow-up visit.  ACTIVITIES:  You may resume regular (light) daily activities beginning the  next day-such as daily self-care, walking, climbing stairs-gradually increasing activities as tolerated.  You may have sexual intercourse when it is comfortable.  Refrain from any heavy lifting or straining until approved by your doctor.  You may drive when you are no longer taking prescription pain medication, you can comfortably wear a seatbelt, and you can safely maneuver your car and apply brakes.  You should see your doctor in the office for a follow-up appointment approximately 2-3 weeks after your surgery.  Make sure that you call for this appointment within a day or two after you arrive home to insure a convenient appointment time.  WHEN TO CALL YOUR DOCTOR: 1. Fever over 101.0 2. Inability to urinate 3. Continued bleeding from incision 4. Increased pain, redness, or drainage from the incision 5. Increasing abdominal pain  The clinic staff is available to answer your questions during regular business hours.  Please don't hesitate to call and ask to speak to one of the nurses for clinical concerns.  If you have a medical emergency, go to the nearest emergency room or call 911.  A surgeon from [facility] is always on call for the hospital.  Rayansh Herbst M. Kenedie Dirocco, MD, FACS [facility] Office: [phone] Toll Free:  [phone] FAX [phone]  Web site: [email or web address] 

## 2013-12-14 ENCOUNTER — Other Ambulatory Visit (INDEPENDENT_AMBULATORY_CARE_PROVIDER_SITE_OTHER): Payer: Self-pay | Admitting: *Deleted

## 2013-12-14 ENCOUNTER — Encounter (INDEPENDENT_AMBULATORY_CARE_PROVIDER_SITE_OTHER): Payer: Self-pay | Admitting: *Deleted

## 2013-12-14 DIAGNOSIS — K743 Primary biliary cirrhosis: Secondary | ICD-10-CM

## 2014-03-01 DIAGNOSIS — K743 Primary biliary cirrhosis: Secondary | ICD-10-CM | POA: Diagnosis not present

## 2014-03-02 ENCOUNTER — Other Ambulatory Visit (INDEPENDENT_AMBULATORY_CARE_PROVIDER_SITE_OTHER): Payer: Self-pay | Admitting: Internal Medicine

## 2014-03-02 LAB — HEPATIC FUNCTION PANEL
ALT: 25 U/L (ref 0–35)
AST: 36 U/L (ref 0–37)
Albumin: 4.1 g/dL (ref 3.5–5.2)
Alkaline Phosphatase: 148 U/L — ABNORMAL HIGH (ref 39–117)
BILIRUBIN DIRECT: 0.1 mg/dL (ref 0.0–0.3)
Indirect Bilirubin: 0.2 mg/dL (ref 0.2–1.2)
Total Bilirubin: 0.3 mg/dL (ref 0.2–1.2)
Total Protein: 7.8 g/dL (ref 6.0–8.3)

## 2014-03-08 ENCOUNTER — Telehealth (INDEPENDENT_AMBULATORY_CARE_PROVIDER_SITE_OTHER): Payer: Self-pay | Admitting: *Deleted

## 2014-03-08 DIAGNOSIS — K743 Primary biliary cirrhosis: Secondary | ICD-10-CM

## 2014-03-08 NOTE — Telephone Encounter (Signed)
Per Dr.Rehman the patient will need to have labs drawn prior to OV in April 2016. Lab is noted for 08/09/14.

## 2014-04-04 ENCOUNTER — Ambulatory Visit (INDEPENDENT_AMBULATORY_CARE_PROVIDER_SITE_OTHER): Payer: Medicare Other | Admitting: Family Medicine

## 2014-04-04 ENCOUNTER — Encounter: Payer: Self-pay | Admitting: Family Medicine

## 2014-04-04 VITALS — BP 160/90 | HR 83 | Resp 16 | Ht 65.0 in | Wt 170.0 lb

## 2014-04-04 DIAGNOSIS — Z0000 Encounter for general adult medical examination without abnormal findings: Secondary | ICD-10-CM | POA: Diagnosis not present

## 2014-04-04 DIAGNOSIS — E785 Hyperlipidemia, unspecified: Secondary | ICD-10-CM

## 2014-04-04 DIAGNOSIS — I10 Essential (primary) hypertension: Secondary | ICD-10-CM

## 2014-04-04 NOTE — Patient Instructions (Addendum)
F/u in 4 month, call if you need me before    You are referred to Dr  Benjamine Mola for hearing loss, and hoarseness  Enjoy silver sneakers    Be careful about falls, watch the right knee  F/u in 4 month Call and make appointment with your  Therapist please  Fasting lipid, cmp, CBc and TSH in 4  Month  Call and come in for flu vac when you decide on this  All the best for 2016  Pls work on advanced directives as we discussed

## 2014-04-04 NOTE — Progress Notes (Signed)
Subjective:    Patient ID: Yolanda Foley, female    DOB: [birth year], 73 y.o.   MRN: [id number]  HPI Preventive Screening-Counseling & Management   Patient present here today for a Medicare annual wellness visit.   Current Problems (verified)   Medications Prior to Visit Allergies (verified)   PAST HISTORY  Family History (verified)   Social History  Married for 54 years 4 children, retired    Risk Factors  Current exercise habits:  Goes to silver sneakers 3 times a week   Dietary issues discussed: Heart healthy diet discussed, eats a lot of fruits and vegetables, limits salt and fried fatty foods. Eats more chicken fish and [place]    Cardiac risk factors: none significant  Depression Screen  (Note: if answer to either of the following is "Yes", a more complete depression screening is indicated)   Over the past two weeks, have you felt down, depressed or hopeless? Some due to recent loss of loved one  Over the past two weeks, have you felt little interest or pleasure in doing things? No  Have you lost interest or pleasure in daily life? No  Do you often feel hopeless? No  Do you cry easily over simple problems? No   Activities of Daily Living  In your present state of health, do you have any difficulty performing the following activities?  Driving?: No Managing money?: No Feeding yourself?:No Getting from bed to chair?:No Climbing a flight of stairs?:yes due to right knee Preparing food and eating?:No Bathing or showering?:No Getting dressed?:No Getting to the toilet?:No Using the toilet?:No Moving around from place to place?: No  Fall Risk Assessment In the past year have you fallen or had a near fall?:No Are you currently taking any medications that make you dizzy?:No   Hearing Difficulties: No Do you often ask people to speak up or repeat themselves?:sometimes Do you experience ringing or noises in your ears?:No Do you have difficulty understanding soft  or whispered voices?:No  Cognitive Testing  Alert? Yes Normal Appearance?Yes  Oriented to person? Yes Place? Yes  Time? Yes  Displays appropriate judgment?Yes  Can read the correct time from a watch face? yes Are you having problems remembering things?No  Advanced Directives have been discussed with the patient?Yes, brochure given, no living will , full code   List the Names of Other Physician/Practitioners you currently use:  Dr Laural Golden (GI) Dr Benjamine Mola (ENT)   Indicate any recent Medical Services you may have received from other than [facility] providers in the past year (date may be approximate).   Assessment:    Annual Wellness Exam   Plan:    Medicare Attestation  I have personally reviewed:  The patient's medical and social history  Their use of alcohol, tobacco or illicit drugs  Their current medications and supplements  The patient's functional ability including ADLs,fall risks, home safety risks, cognitive, and hearing and visual impairment  Diet and physical activities  Evidence for depression or mood disorders  The patient's weight, height, BMI, and visual acuity have been recorded in the chart. I have made referrals, counseling, and provided education to the patient based on review of the above and I have provided the patient with a written personalized care plan for preventive services.   \   Review of Systems     Objective:   Physical Exam BP 160/90 mmHg  Pulse 83  Resp 16  Ht 5\' 5"  (1.651 m)  Wt 170 lb (77.111 kg)  BMI  28.29 kg/m2  SpO2 97%        Assessment & Plan:  Medicare annual wellness visit, subsequent Annual exam as documented. Counseling done  re healthy lifestyle involving commitment to 150 minutes exercise per week, heart healthy diet, and attaining healthy weight.The importance of adequate sleep also discussed. Regular seat belt use and home safety, is also discussed. Changes in health habits are decided on by the patient with goals and time  frames  set for achieving them. Immunization and cancer screening needs are specifically addressed at this visit.   Essential hypertension Remains uncontrolled, pt reports med intolerance and does not take medication consistently, an ongoing problem

## 2014-04-09 ENCOUNTER — Encounter: Payer: Self-pay | Admitting: Family Medicine

## 2014-04-09 DIAGNOSIS — Z0000 Encounter for general adult medical examination without abnormal findings: Secondary | ICD-10-CM | POA: Insufficient documentation

## 2014-04-09 NOTE — Assessment & Plan Note (Signed)

## 2014-04-09 NOTE — Assessment & Plan Note (Signed)
Remains uncontrolled, pt reports med intolerance and does not take medication consistently, an ongoing problem

## 2014-04-25 ENCOUNTER — Encounter (INDEPENDENT_AMBULATORY_CARE_PROVIDER_SITE_OTHER): Payer: Self-pay | Admitting: *Deleted

## 2014-05-17 ENCOUNTER — Ambulatory Visit (INDEPENDENT_AMBULATORY_CARE_PROVIDER_SITE_OTHER): Payer: Medicare Other | Admitting: Internal Medicine

## 2014-05-17 ENCOUNTER — Encounter (INDEPENDENT_AMBULATORY_CARE_PROVIDER_SITE_OTHER): Payer: Self-pay | Admitting: Internal Medicine

## 2014-05-17 VITALS — BP 120/70 | HR 72 | Temp 98.0°F | Ht 65.5 in | Wt 173.1 lb

## 2014-05-17 DIAGNOSIS — R52 Pain, unspecified: Secondary | ICD-10-CM | POA: Diagnosis not present

## 2014-05-17 DIAGNOSIS — K743 Primary biliary cirrhosis: Secondary | ICD-10-CM | POA: Diagnosis not present

## 2014-05-17 LAB — HEPATIC FUNCTION PANEL
ALBUMIN: 3.7 g/dL (ref 3.5–5.2)
ALK PHOS: 146 U/L — AB (ref 39–117)
ALT: 26 U/L (ref 0–35)
AST: 37 U/L (ref 0–37)
Bilirubin, Direct: 0.1 mg/dL (ref 0.0–0.3)
Indirect Bilirubin: 0.3 mg/dL (ref 0.2–1.2)
Total Bilirubin: 0.4 mg/dL (ref 0.2–1.2)
Total Protein: 8 g/dL (ref 6.0–8.3)

## 2014-05-17 NOTE — Progress Notes (Addendum)
Subjective:    Patient ID: Yolanda Foley, female    DOB: 08/06/1940, 74 y.o.   MRN: [id number]  HPI Pesents today with c/o aching left arm and neck. She says it feels like flu like pain. Symptoms for about a week. There has been no fever. No cough. She does feel tired.  Appetite is good. She tells me she went to [place] for 2 weeks to see family. She has been aching all over every since.   BMs are normal. No melena or BRRB.  Her last visit was in April of 2015. Hx of elevated transaminases and ALP. 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 She apparently refused a liver biopsy.  12/24/2010 Mitochondrial antibodies 2.84.   Hepatic Function Panel     Component Value Date/Time   PROT 7.8 03/01/2014 1433   ALBUMIN 4.1 03/01/2014 1433   AST 36 03/01/2014 1433   ALT 25 03/01/2014 1433   ALKPHOS 148* 03/01/2014 1433   BILITOT 0.3 03/01/2014 1433   BILIDIR 0.1 03/01/2014 1433   IBILI 0.2 03/01/2014 1433        Review of Systems Past Medical History  Diagnosis Date  . Depression   . Chronic pain   . GERD (gastroesophageal reflux disease)   . Palpitation   . Hyperlipidemia   . Hypertension   . Nonspecific elevation of levels of transaminase or lactic acid dehydrogenase (LDH)   . Elevated liver enzymes     Past Surgical History  Procedure Laterality Date  . Tonsillectomy  1967   . Left ovarian cyst removal  1974  . Correction of nasal surgery  35 years ago     Allergies  Allergen Reactions  . [place]   . Fluoxetine Other (See Comments)    Dull headaache  . Sulfonamide Derivatives     Current Outpatient Prescriptions on File Prior to Visit  Medication Sig Dispense Refill  . aspirin (ASPIRIN LOW DOSE) 81 MG EC tablet Take 81 mg by mouth as needed. Take one tablet by mouth once a day    . Calcium Carbonate-Vitamin D (CALCIUM 600-D) 600-400 MG-UNIT per tablet Take 1 tablet by mouth daily. 90 tablet 6  . Chlorophyll  50 MG CAPS Take 3 capsules by mouth daily.    . Coenzyme Q10-Fish Oil-Vit E (CO-Q 10 OMEGA-3 FISH OIL) CAPS Take 2 capsules by mouth daily.    . Digestive Enzymes (PAPAYA ENZYME) CHEW Chew by mouth as needed.    Nyoka Cowden Tea, Camillia sinensis, (GREEN TEA PO) Take by mouth. Patient states that this is a decaf tea and she drinks it 1-2 times daily    . Methylsulfonylmethane (MSM) 500 MG CAPS Take by mouth 2 (two) times daily.    . Multiple Vitamin (MULTIVITAMIN PO) Take by mouth daily.      Marland Kitchen olmesartan (BENICAR) 40 MG tablet One tablet once daily 30 tablet 5  . Omega 3 1000 MG CAPS Take 2 capsules by mouth daily.    Marland Kitchen omeprazole (PRILOSEC) 20 MG capsule Take 1 capsule (20 mg total) by mouth 2 (two) times daily. (Patient taking differently: Take 20 mg by mouth daily. ) 60 capsule 6  . PAPAYA PO Take by mouth.    . ursodiol (ACTIGALL) 300 MG capsule TAKE ONE CAPSULE BY MOUTH THREE TIMES DAILY AFTER MEALS (Patient taking differently: one twice a day) 90 capsule 3   No current facility-administered medications on file prior  to visit.        Objective:   Physical Exam   Filed Vitals:   05/17/14 1039  Height: 5' 5.5" (1.664 m)  Weight: 173 lb 1.6 oz (78.518 kg)   Alert and oriented. Skin warm and dry. Oral mucosa is moist.   . Sclera anicteric, conjunctivae is pink. Thyroid not enlarged. No cervical lymphadenopathy. Lungs clear. Heart regular rate and rhythm.  Abdomen is soft. Bowel sounds are positive. No hepatomegaly. No abdominal masses felt. No tenderness.  No edema to lower extremities.         Assessment & Plan:  Body Aches: ? Viral in nature or related to recent trip to [place]. PBC: Urso 300mg  TID. Appt in April Hepatic function today.

## 2014-05-17 NOTE — Patient Instructions (Signed)
OV in April.

## 2014-07-26 ENCOUNTER — Other Ambulatory Visit (INDEPENDENT_AMBULATORY_CARE_PROVIDER_SITE_OTHER): Payer: Self-pay | Admitting: *Deleted

## 2014-07-26 ENCOUNTER — Encounter (INDEPENDENT_AMBULATORY_CARE_PROVIDER_SITE_OTHER): Payer: Self-pay | Admitting: *Deleted

## 2014-07-26 DIAGNOSIS — K743 Primary biliary cirrhosis: Secondary | ICD-10-CM

## 2014-08-08 ENCOUNTER — Other Ambulatory Visit (INDEPENDENT_AMBULATORY_CARE_PROVIDER_SITE_OTHER): Payer: Self-pay | Admitting: Internal Medicine

## 2014-08-08 DIAGNOSIS — K743 Primary biliary cirrhosis: Secondary | ICD-10-CM | POA: Diagnosis not present

## 2014-08-09 ENCOUNTER — Ambulatory Visit (INDEPENDENT_AMBULATORY_CARE_PROVIDER_SITE_OTHER): Payer: Medicare Other | Admitting: Internal Medicine

## 2014-08-09 ENCOUNTER — Encounter (INDEPENDENT_AMBULATORY_CARE_PROVIDER_SITE_OTHER): Payer: Self-pay | Admitting: Internal Medicine

## 2014-08-09 VITALS — BP 150/76 | HR 65 | Temp 98.1°F | Ht 65.0 in | Wt 173.2 lb

## 2014-08-09 DIAGNOSIS — K743 Primary biliary cirrhosis: Secondary | ICD-10-CM

## 2014-08-09 LAB — HEPATIC FUNCTION PANEL
ALT: 24 U/L (ref 0–35)
AST: 33 U/L (ref 0–37)
Albumin: 3.5 g/dL (ref 3.5–5.2)
Alkaline Phosphatase: 126 U/L — ABNORMAL HIGH (ref 39–117)
BILIRUBIN TOTAL: 0.3 mg/dL (ref 0.2–1.2)
Bilirubin, Direct: 0.1 mg/dL (ref 0.0–0.3)
Indirect Bilirubin: 0.2 mg/dL (ref 0.2–1.2)
TOTAL PROTEIN: 7.6 g/dL (ref 6.0–8.3)

## 2014-08-09 NOTE — Progress Notes (Signed)
Subjective:    Patient ID: Yolanda Foley, female    DOB: [birth year], 74 y.o.   MRN: [id number]  HPI Here to today for f/u of her PBS. Last seen in January of this year by me. Hx of elevated transaminases and ALP. 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 She apparently refused a lifer biopsy.  She tells me she is doing okay. She stays tired all the time. She c/o feeling cold.  Appetite has been good for the most part. No weight loss. She has a BM daily.  No melena or BRRB. Her last colonoscopy was in 2011 and was normal by Dr. Laural Golden and was normal.   Total bili 0.3, ALP 126, AST 33, ALT 24,total protein 7.6, Albumin 3.5  06/27/2010 Hep C antibody negative Hepatitis A antibody, Igm negative Hepatitis B surface antigen negative Hepatitis B Core Ab, igM negative  CBC    Component Value Date/Time   WBC 4.8 12/23/2012 0940   RBC 3.82* 12/23/2012 0940   HGB 11.0* 12/23/2012 0940   HCT 33.9* 12/23/2012 0940   PLT 200 12/23/2012 0940   MCV 88.7 12/23/2012 0940   MCH 28.8 12/23/2012 0940   MCHC 32.4 12/23/2012 0940   RDW 14.7 12/23/2012 0940   LYMPHSABS 1.7 12/23/2012 0940   MONOABS 0.3 12/23/2012 0940   EOSABS 0.1 12/23/2012 0940   BASOSABS 0.0 12/23/2012 0940      Hepatic Function Latest Ref Rng 05/17/2014 03/01/2014 11/02/2013  Total Protein 6.0 - 8.3 g/dL 8.0 7.8 8.4(H)  Albumin 3.5 - 5.2 g/dL 3.7 4.1 4.3  AST 0 - 37 U/L 37 36 40(H)  ALT 0 - 35 U/L 26 25 33  Alk Phosphatase 39 - 117 U/L 146(H) 148(H) 187(H)  Total Bilirubin 0.2 - 1.2 mg/dL 0.4 0.3 0.5  Bilirubin, Direct 0.0 - 0.3 mg/dL 0.1 0.1 0.1          01/03/2013 Mitochondrial antibodies 5.7 SMA 30.   12/24/2010 Mitochondrial antibodies 2.84.     Review of Systems Married. 4 children in good health.    Past Medical History  Diagnosis Date  . Depression   . Chronic pain   . GERD (gastroesophageal reflux disease)   . Palpitation   . Hyperlipidemia   . Hypertension   .  Nonspecific elevation of levels of transaminase or lactic acid dehydrogenase (LDH)   . Elevated liver enzymes   . Primary biliary cirrhosis     Past Surgical History  Procedure Laterality Date  . Tonsillectomy  1967   . Left ovarian cyst removal  1974  . Correction of nasal surgery  35 years ago     Allergies  Allergen Reactions  . [place]   . Fluoxetine Other (See Comments)    Dull headaache  . Sulfonamide Derivatives     Current Outpatient Prescriptions on File Prior to Visit  Medication Sig Dispense Refill  . aspirin (ASPIRIN LOW DOSE) 81 MG EC tablet Take 81 mg by mouth as needed. Take one tablet by mouth once a day    . Calcium Carbonate-Vitamin D (CALCIUM 600-D) 600-400 MG-UNIT per tablet Take 1 tablet by mouth daily. 90 tablet 6  . Chlorophyll 50 MG CAPS Take 3 capsules by mouth daily.    . Coenzyme Q10-Fish Oil-Vit E (CO-Q 10 OMEGA-3 FISH OIL) CAPS Take 2 capsules by mouth daily.    . Digestive Enzymes (PAPAYA ENZYME) CHEW Chew by mouth as needed.    Marland Kitchen  Green Tea, Camillia sinensis, (GREEN TEA PO) Take by mouth. Patient states that this is a decaf tea and she drinks it 1-2 times daily    . Methylsulfonylmethane (MSM) 500 MG CAPS Take by mouth 2 (two) times daily.    . Multiple Vitamin (MULTIVITAMIN PO) Take by mouth daily.      . Omega 3 1000 MG CAPS Take 2 capsules by mouth daily.    Marland Kitchen PAPAYA PO Take by mouth.    . ursodiol (ACTIGALL) 300 MG capsule TAKE ONE CAPSULE BY MOUTH THREE TIMES DAILY AFTER MEALS (Patient taking differently: one twice a day) 90 capsule 3  . olmesartan (BENICAR) 40 MG tablet One tablet once daily 30 tablet 5   No current facility-administered medications on file prior to visit.        Objective:   Physical Exam Blood pressure 150/76, pulse 65, temperature 98.1 F (36.7 C), height 5' 5"  (1.651 m), weight 173 lb 3.2 oz (78.563 kg).  Alert and oriented. Skin warm and dry. Oral mucosa is moist.   . Sclera anicteric, conjunctivae  is pink. Thyroid not enlarged. No cervical lymphadenopathy. Lungs clear. Heart regular rate and rhythm.  Abdomen is soft. Bowel sounds are positive. No hepatomegaly. No abdominal masses felt. No tenderness.  No edema to lower extremities.       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  ALP looks better. Now 126 OV 6 months with a CBC and Hepatic function.  OV in 6 months with a CBC with diff and Hepatic function

## 2014-08-09 NOTE — Patient Instructions (Addendum)
OV 6 months.  She will have a CBC drawn at Dr. Moshe Cipro on April 23.

## 2014-08-10 ENCOUNTER — Telehealth (INDEPENDENT_AMBULATORY_CARE_PROVIDER_SITE_OTHER): Payer: Self-pay | Admitting: *Deleted

## 2014-08-10 DIAGNOSIS — K743 Primary biliary cirrhosis: Secondary | ICD-10-CM

## 2014-08-10 NOTE — Telephone Encounter (Signed)
.  Per Terri Setzer,NP patient to have labs in 6 months. 

## 2014-08-16 ENCOUNTER — Ambulatory Visit (INDEPENDENT_AMBULATORY_CARE_PROVIDER_SITE_OTHER): Payer: Medicare Other | Admitting: Family Medicine

## 2014-08-16 VITALS — BP 160/86 | HR 76 | Resp 18 | Ht 65.0 in | Wt 172.0 lb

## 2014-08-16 DIAGNOSIS — R102 Pelvic and perineal pain: Secondary | ICD-10-CM

## 2014-08-16 DIAGNOSIS — I10 Essential (primary) hypertension: Secondary | ICD-10-CM | POA: Diagnosis not present

## 2014-08-16 DIAGNOSIS — M810 Age-related osteoporosis without current pathological fracture: Secondary | ICD-10-CM

## 2014-08-16 DIAGNOSIS — R7309 Other abnormal glucose: Secondary | ICD-10-CM | POA: Diagnosis not present

## 2014-08-16 DIAGNOSIS — E785 Hyperlipidemia, unspecified: Secondary | ICD-10-CM | POA: Diagnosis not present

## 2014-08-16 DIAGNOSIS — R7303 Prediabetes: Secondary | ICD-10-CM

## 2014-08-16 DIAGNOSIS — M79601 Pain in right arm: Secondary | ICD-10-CM

## 2014-08-16 MED ORDER — ALENDRONATE SODIUM 70 MG PO TABS: 70.0000 mg | ORAL_TABLET | ORAL | Status: DC

## 2014-08-16 MED ORDER — CLONIDINE HCL 0.1 MG PO TABS
ORAL_TABLET | ORAL | Status: AC
Start: 2014-08-16 — End: 2014-12-16

## 2014-08-16 MED ORDER — OLMESARTAN MEDOXOMIL 20 MG PO TABS: 20.0000 mg | ORAL_TABLET | Freq: Every day | ORAL | Status: DC

## 2014-08-16 NOTE — Progress Notes (Signed)
Subjective:    Patient ID: Yolanda Foley, female    DOB: 27-Jul-1940, 74 y.o.   MRN: [id number]  HPI The PT is here for follow up and re-evaluation of chronic medical conditions, medication management and review of any available recent lab and radiology data.  Preventive health is updated, specifically  Cancer screening and Immunization.   Questions or concerns regarding consultations or procedures which the PT has had in the interim are  addressed. The PT denies any adverse reactions to current medications since the last visit.  2 month h/o worsening upper ext pain , worse in right       Review of Systems    See HPI Denies recent fever or chills. Denies sinus pressure, nasal congestion, ear pain or sore throat. Denies chest congestion, productive cough or wheezing. Denies chest pains, palpitations and leg swelling Denies abdominal pain, nausea, vomiting,diarrhea or constipation.   Denies dysuria, frequency, hesitancy or incontinence.  Denies headaches, seizures, numbness, or tingling. Denies uncontrolled depression, anxiety or insomnia.Needs to return to therpay Denies skin break down or rash.     Objective:   Physical Exam  BP 160/86 mmHg  Pulse 76  Resp 18  Ht 5\' 5"  (1.651 m)  Wt 172 lb (78.019 kg)  BMI 28.62 kg/m2  SpO2 94% Patient alert and oriented and in no cardiopulmonary distress.  HEENT: No facial asymmetry, EOMI,   oropharynx pink and moist.  Neck decreased ROm mc spine no JVD, no mass.  Chest: Clear to auscultation bilaterally.  CVS: S1, S2 no murmurs, no S3.Regular rate.  ABD: Soft non tender.   Ext: No edema  MS: Adequate ROM spine, shoulders, hips and knees.  Skin: Intact, no ulcerations or rash noted.  Psych: Good eye contact, normal affect. Memory intact not anxious or depressed appearing.  CNS: CN 2-12 intact, grade 4  Grip in right hand       Assessment & Plan:  Essential hypertension Elevated and uncontrolled, pt takes medication  intermittently, states it makes her feel bad DASH diet and commitment to daily physical activity for a minimum of 30 minutes discussed and encouraged, as a part of hypertension management. The importance of attaining a healthy weight is also discussed.  BP/Weight 08/30/2014 08/16/2014 08/09/2014 05/17/2014 04/04/2014 10/23/3660 12/30/7652  Systolic BP 650 354 656 812 751 700 174  Diastolic BP 78 86 76 70 90 84 84  Wt. (Lbs) - 172 173.2 173.1 170 171.2 171.08  BMI - 28.62 28.82 28.36 28.29 28.49 28.47      Re evaluate in 2 weeks with BP home cuff  Hyperlipemia Improved, though LDL still elevated  Hyperlipidemia:Low fat diet discussed and encouraged.   Lipid Panel  Lab Results  Component Value Date   CHOL 185 08/28/2014   HDL 57 08/28/2014   LDLCALC 115* 08/28/2014   TRIG 63 08/28/2014   CHOLHDL 3.2 08/28/2014         Pelvic pain in female Gyne evaluation recommended, due to c/o pain and the fact that she does have a cyst on her ovary , she is to call back when she decides on this  Prediabetes  Deteriorated Pt re educated about the importance of limiting  Carbohydrate intake , the need to commit to daily physical activity for a minimum of 30 minutes , and to commit weight loss. The fact that changes in all these areas will reduce or eliminate all together the development of diabetes is stressed.   Diabetic Labs Latest Ref Rng 08/28/2014  07/11/2013 12/23/2012 09/22/2012 05/18/2012  HbA1c <5.7 % 5.9(H) 5.4 - 5.6 5.7(H)  Chol 0 - 200 mg/dL 185 - 209(H) - -  HDL >=46 mg/dL 57 - 64 - -  Calc LDL 0 - 99 mg/dL 115(H) - 129(H) - -  Triglycerides <150 mg/dL 63 - 82 - -  Creatinine 0.50 - 1.10 mg/dL 0.66 0.67 - 0.68 -   BP/Weight 08/30/2014 08/16/2014 08/09/2014 05/17/2014 04/04/2014 0/86/7619 5/0/9326  Systolic BP 712 458 099 833 825 053 976  Diastolic BP 78 86 76 70 90 84 84  Wt. (Lbs) - 172 173.2 173.1 170 171.2 171.08  BMI - 28.62 28.82 28.36 28.29 28.49 28.47   No flowsheet data  found.     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

## 2014-08-16 NOTE — Patient Instructions (Addendum)
Annual wellness in 3 month, please call if you need me before  Nurse blood pressure check in 2 weeks with your meter  Pls commit to taking the benicar and half clonidine tablet every night at the same time your blood pressure is high. 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  I will refer once you let us know  Fasting CBC, lipid, chem 7 , TSH and vit D as soon as possible, HBA1C  It is important that you exercise regularly at least 30 minutes 5 times a week. If you develop chest pain, have severe difficulty breathing, or feel very tired, stop exercising immediately and seek medical attention    Thanks for choosing [facility], we consider it a privelige to serve you.   Thanks for choosing [facility], we consider it a privelige to serve you.

## 2014-08-28 DIAGNOSIS — R7309 Other abnormal glucose: Secondary | ICD-10-CM | POA: Diagnosis not present

## 2014-08-28 DIAGNOSIS — E785 Hyperlipidemia, unspecified: Secondary | ICD-10-CM | POA: Diagnosis not present

## 2014-08-28 DIAGNOSIS — M810 Age-related osteoporosis without current pathological fracture: Secondary | ICD-10-CM | POA: Diagnosis not present

## 2014-08-28 DIAGNOSIS — I10 Essential (primary) hypertension: Secondary | ICD-10-CM | POA: Diagnosis not present

## 2014-08-29 LAB — CBC
HCT: 33.6 % — ABNORMAL LOW (ref 36.0–46.0)
HEMOGLOBIN: 10.9 g/dL — AB (ref 12.0–15.0)
MCH: 28.9 pg (ref 26.0–34.0)
MCHC: 32.4 g/dL (ref 30.0–36.0)
MCV: 89.1 fL (ref 78.0–100.0)
MPV: 12.4 fL (ref 8.6–12.4)
Platelets: 171 10*3/uL (ref 150–400)
RBC: 3.77 MIL/uL — AB (ref 3.87–5.11)
RDW: 14 % (ref 11.5–15.5)
WBC: 4.4 10*3/uL (ref 4.0–10.5)

## 2014-08-29 LAB — LIPID PANEL
CHOLESTEROL: 185 mg/dL (ref 0–200)
HDL: 57 mg/dL (ref >=46)
LDL Cholesterol: 115 mg/dL — ABNORMAL HIGH (ref 0–99)
TRIGLYCERIDES: 63 mg/dL (ref <150)
Total CHOL/HDL Ratio: 3.2 Ratio
VLDL: 13 mg/dL (ref 0–40)

## 2014-08-29 LAB — BASIC METABOLIC PANEL
BUN: 11 mg/dL (ref 6–23)
CO2: 28 mEq/L (ref 19–32)
Calcium: 9 mg/dL (ref 8.4–10.5)
Chloride: 102 mEq/L (ref 96–112)
Creat: 0.66 mg/dL (ref 0.50–1.10)
GLUCOSE: 75 mg/dL (ref 70–99)
Potassium: 4.1 mEq/L (ref 3.5–5.3)
Sodium: 137 mEq/L (ref 135–145)

## 2014-08-29 LAB — HEMOGLOBIN A1C
Hgb A1c MFr Bld: 5.9 % — ABNORMAL HIGH (ref <5.7)
MEAN PLASMA GLUCOSE: 123 mg/dL — AB (ref <117)

## 2014-08-29 LAB — TSH: TSH: 0.514 u[IU]/mL (ref 0.350–4.500)

## 2014-08-29 LAB — VITAMIN D 25 HYDROXY (VIT D DEFICIENCY, FRACTURES): VIT D 25 HYDROXY: 27 ng/mL — AB (ref 30–100)

## 2014-08-30 ENCOUNTER — Ambulatory Visit: Payer: Medicare Other

## 2014-08-30 VITALS — BP 130/78

## 2014-08-30 DIAGNOSIS — I10 Essential (primary) hypertension: Secondary | ICD-10-CM

## 2014-08-30 NOTE — Progress Notes (Signed)
Patient in for blood pressure check.  Blood pressure checked manually.  Reading of 130/78 recorded manually.  Patient rechecked with her own cuff with a reading of 132/84.   Patient made aware of lab results.

## 2014-10-08 ENCOUNTER — Encounter: Payer: Self-pay | Admitting: Family Medicine

## 2014-10-08 DIAGNOSIS — M25512 Pain in left shoulder: Secondary | ICD-10-CM | POA: Insufficient documentation

## 2014-10-08 NOTE — Assessment & Plan Note (Signed)
  Deteriorated Pt re educated about the importance of limiting  Carbohydrate intake , the need to commit to daily physical activity for a minimum of 30 minutes , and to commit weight loss. The fact that changes in all these areas will reduce or eliminate all together the development of diabetes is stressed.   Diabetic Labs Latest Ref Rng 08/28/2014 07/11/2013 12/23/2012 09/22/2012 05/18/2012  HbA1c <5.7 % 5.9(H) 5.4 - 5.6 5.7(H)  Chol 0 - 200 mg/dL 185 - 209(H) - -  HDL >=46 mg/dL 57 - 64 - -  Calc LDL 0 - 99 mg/dL 115(H) - 129(H) - -  Triglycerides <150 mg/dL 63 - 82 - -  Creatinine 0.50 - 1.10 mg/dL 0.66 0.67 - 0.68 -   BP/Weight 08/30/2014 08/16/2014 08/09/2014 05/17/2014 04/04/2014 0/38/3338 06/27/9189  Systolic BP 660 600 459 977 414 239 532  Diastolic BP 78 86 76 70 90 84 84  Wt. (Lbs) - 172 173.2 173.1 170 171.2 171.08  BMI - 28.62 28.82 28.36 28.29 28.49 28.47   No flowsheet data found.

## 2014-10-08 NOTE — Assessment & Plan Note (Signed)
Elevated and uncontrolled, pt takes medication intermittently, states it makes her feel bad DASH diet and commitment to daily physical activity for a minimum of 30 minutes discussed and encouraged, as a part of hypertension management. The importance of attaining a healthy weight is also discussed.  BP/Weight 08/30/2014 08/16/2014 08/09/2014 05/17/2014 04/04/2014 05/29/69 05/29/9756  Systolic BP 832 549 826 415 830 940 768  Diastolic BP 78 86 76 70 90 84 84  Wt. (Lbs) - 172 173.2 173.1 170 171.2 171.08  BMI - 28.62 28.82 28.36 28.29 28.49 28.47      Re evaluate in 2 weeks with BP home cuff

## 2014-10-08 NOTE — Assessment & Plan Note (Signed)
Gyne evaluation recommended, due to c/o pain and the fact that she does have a cyst on her ovary , she is to call back when she decides on this

## 2014-10-08 NOTE — Assessment & Plan Note (Addendum)
remains asymptomatic , watchful management only at this time, has seen gen surgery and is also followed by GI for PBC

## 2014-10-08 NOTE — Assessment & Plan Note (Signed)
Improved, though LDL still elevated  Hyperlipidemia:Low fat diet discussed and encouraged.   Lipid Panel  Lab Results  Component Value Date   CHOL 185 08/28/2014   HDL 57 08/28/2014   LDLCALC 115* 08/28/2014   TRIG 63 08/28/2014   CHOLHDL 3.2 08/28/2014

## 2014-10-08 NOTE — Assessment & Plan Note (Signed)
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

## 2014-10-23 ENCOUNTER — Other Ambulatory Visit: Payer: Self-pay

## 2014-12-20 ENCOUNTER — Encounter: Payer: Self-pay | Admitting: Family Medicine

## 2015-01-10 ENCOUNTER — Ambulatory Visit (INDEPENDENT_AMBULATORY_CARE_PROVIDER_SITE_OTHER): Payer: Medicare Other | Admitting: Family Medicine

## 2015-01-10 ENCOUNTER — Encounter: Payer: Self-pay | Admitting: Family Medicine

## 2015-01-10 VITALS — BP 156/80 | HR 78 | Resp 18 | Ht 65.0 in | Wt 171.1 lb

## 2015-01-10 DIAGNOSIS — E785 Hyperlipidemia, unspecified: Secondary | ICD-10-CM

## 2015-01-10 DIAGNOSIS — M810 Age-related osteoporosis without current pathological fracture: Secondary | ICD-10-CM | POA: Diagnosis not present

## 2015-01-10 DIAGNOSIS — R7303 Prediabetes: Secondary | ICD-10-CM

## 2015-01-10 DIAGNOSIS — R7309 Other abnormal glucose: Secondary | ICD-10-CM

## 2015-01-10 DIAGNOSIS — F32A Depression, unspecified: Secondary | ICD-10-CM

## 2015-01-10 DIAGNOSIS — Z9114 Patient's other noncompliance with medication regimen: Secondary | ICD-10-CM

## 2015-01-10 DIAGNOSIS — F329 Major depressive disorder, single episode, unspecified: Secondary | ICD-10-CM

## 2015-01-10 DIAGNOSIS — Z1231 Encounter for screening mammogram for malignant neoplasm of breast: Secondary | ICD-10-CM

## 2015-01-10 DIAGNOSIS — Z1211 Encounter for screening for malignant neoplasm of colon: Secondary | ICD-10-CM | POA: Diagnosis not present

## 2015-01-10 DIAGNOSIS — I10 Essential (primary) hypertension: Secondary | ICD-10-CM

## 2015-01-10 DIAGNOSIS — K743 Primary biliary cirrhosis: Secondary | ICD-10-CM

## 2015-01-10 DIAGNOSIS — G4700 Insomnia, unspecified: Secondary | ICD-10-CM

## 2015-01-10 DIAGNOSIS — K8070 Calculus of gallbladder and bile duct without cholecystitis without obstruction: Secondary | ICD-10-CM

## 2015-01-10 LAB — HEMOCCULT GUIAC POC 1CARD (OFFICE): FECAL OCCULT BLD: NEGATIVE

## 2015-01-10 MED ORDER — ALENDRONATE SODIUM 70 MG PO TABS: 70.0000 mg | ORAL_TABLET | ORAL | Status: DC

## 2015-01-10 NOTE — Progress Notes (Signed)
Subjective:    Patient ID: Yolanda Foley, female    DOB: February 28, 1941, 74 y.o.   MRN: [id number]  HPI   Yolanda Foley     MRN: [id number]      DOB: [birth year]   HPI Yolanda Foley is here for follow up and re-evaluation of chronic medical conditions, medication management and review of any available recent lab and radiology data.  Preventive health is updated, specifically  Cancer screening and Immunization.  Refuses recommended immunization  The PT denies any adverse reactions to current medications since the last visit. Reports compliance with BP medication, and unfortunately every attempt to increase/ adjust the dose , is met with excessive rsistance Has started attending exercise with silver sneakers program, and is irritated that her spouse actually goes along with her  ROS Denies recent fever or chills. Denies sinus pressure, nasal congestion, ear pain or sore throat. Denies chest congestion, productive cough or wheezing. Denies chest pains, palpitations and leg swelling Denies abdominal pain, nausea, vomiting,diarrhea or constipation.   Denies dysuria, frequency, hesitancy or incontinence. Denies joint pain, swelling and limitation in mobility. Denies headaches, seizures, numbness, or tingling. Denies depression, c/o increased  anxiety and  Insomnia.Unchanged, had been seeing therapist but has  Stopped, an always c/o spouse and the fact that he "gets on her nerves" causes her a lot of stress reported;ly,denies mental or physical abuse  Denies skin break down or rash.   PE  BP 156/80 mmHg  Pulse 78  Resp 18  Ht _0  (1.651 m)  Wt 171 lb 1.9 oz (77.62 kg)  BMI 28.48 kg/m2  SpO2 98%  Patient alert and oriented and in no cardiopulmonary distress.  HEENT: No facial asymmetry, EOMI,   oropharynx pink and moist.  Neck supple no JVD, no mass.  Chest: Clear to auscultation bilaterally.  CVS: S1, S2 no murmurs, no S3.Regular rate.  ABD: Soft non tender.No organomegaly or mass.  Normal bowel sounds Rectal: no mass, heme negative stool   Ext: No edema  MS: Adequate ROM spine, shoulders, hips and knees.  Skin: Intact, no ulcerations or rash noted.  Psych: Good eye contact, normal affect. Memory intact not anxious or depressed appearing.  CNS: CN 2-12 intact, power,  normal throughout.no focal deficits noted.   Assessment & Plan   Essential hypertension Uncontrolled, [pt reports intolerance whenever dose is increased and has had several medication adjustments attempted in the past, always not able to compky , due to"excess fatigue" and feeling drained. No interest in cardiology input  DASH diet and commitment to daily physical activity for a minimum of 30 minutes discussed and encouraged, as a part of hypertension management. The importance of attaining a healthy weight is also discussed.  BP/Weight 01/10/2015 08/30/2014 08/16/2014 08/09/2014 05/17/2014 04/04/2014 2/69/4854  Systolic BP 627 035 009 381 829 937 169  Diastolic BP 80 78 86 76 70 90 84  Wt. (Lbs) 171.12 - 172 173.2 173.1 170 171.2  BMI 28.48 - 28.62 28.82 28.36 28.29 28.49        Prediabetes Patient educated about the importance of limiting  Carbohydrate intake , the need to commit to daily physical activity for a minimum of 30 minutes , and to commit weight loss. The fact that changes in all these areas will reduce or eliminate all together the development of diabetes is stressed.  Unchanged, needs to continue to work on this  Diabetic Labs Latest Ref Rng 01/10/2015 08/28/2014 07/11/2013 12/23/2012 09/22/2012  HbA1c <5.7 % 5.9(H)  5.9(H) 5.4 - 5.6  Chol 0 - 200 mg/dL - 185 - 209(H) -  HDL >=46 mg/dL - 57 - 64 -  Calc LDL 0 - 99 mg/dL - 115(H) - 129(H) -  Triglycerides <150 mg/dL - 63 - 82 -  Creatinine 0.60 - 0.93 mg/dL 0.65 0.66 0.67 - 0.68   BP/Weight 01/10/2015 08/30/2014 08/16/2014 08/09/2014 05/17/2014 04/04/2014 1/61/0960  Systolic BP 454 098 119 147 829 562 130  Diastolic BP 80 78 86 76 70 90 84   Wt. (Lbs) 171.12 - 172 173.2 173.1 170 171.2  BMI 28.48 - 28.62 28.82 28.36 28.29 28.49   No flowsheet data found.     Non compliance w medication regimen Unable to adequately treat blood pressure as pt reports intolerance repeatedly cities fatigue. 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 Best case scenario involves therapy, but she is not interested currently, does not feel beneficial  Hyperlipemia Hyperlipidemia:Low fat diet discussed and encouraged.   Lipid Panel  Lab Results  Component Value Date   CHOL 185 08/28/2014   HDL 57 08/28/2014   LDLCALC 115* 08/28/2014   TRIG 63 08/28/2014   CHOLHDL 3.2 08/28/2014        Osteoporosis Daily weight bearing physical activity and weekly bisphosphonate as well as calcium with D supplement, will need rept dexa in 2017      Assessment:     Plan:       Review of Systems     Objective:   Physical Exam        Assessment & Plan:

## 2015-01-10 NOTE — Patient Instructions (Addendum)
Annual wellness end Jan, call if you need me before  Rectal exam today is negative for hidden blood, which is GOOD  Please continue to go to silver sneakers and to work on stress management  You are referred for a mammogram in [place] as usual   Increase vegetable and fruit intake and lower canned foods and salty foods to help blood pressure  BP is still higher than desired so PLEASE work on stress relief and commitment to taking medication every day as prescribed  ENSURE 64 ounces water daily, clinically you are NOT dehydrated  HBa1C , chem 7 and EGFr today  ALL tHE BEST!!  Thanks for choosing [facility], we consider it a privelige to serve you.  

## 2015-01-11 ENCOUNTER — Telehealth (INDEPENDENT_AMBULATORY_CARE_PROVIDER_SITE_OTHER): Payer: Self-pay | Admitting: *Deleted

## 2015-01-11 DIAGNOSIS — K743 Primary biliary cirrhosis: Secondary | ICD-10-CM

## 2015-01-11 LAB — COMPLETE METABOLIC PANEL WITH GFR
ALK PHOS: 146 U/L — AB (ref 33–130)
ALT: 29 U/L (ref 6–29)
AST: 38 U/L — ABNORMAL HIGH (ref 10–35)
Albumin: 4.1 g/dL (ref 3.6–5.1)
BUN: 9 mg/dL (ref 7–25)
CALCIUM: 9.4 mg/dL (ref 8.6–10.4)
CO2: 27 mmol/L (ref 20–31)
Chloride: 102 mmol/L (ref 98–110)
Creat: 0.65 mg/dL (ref 0.60–0.93)
GFR, EST NON AFRICAN AMERICAN: 87 mL/min (ref >=60)
GFR, Est African American: >89 mL/min (ref >=60)
GLUCOSE: 79 mg/dL (ref 65–99)
POTASSIUM: 4 mmol/L (ref 3.5–5.3)
SODIUM: 138 mmol/L (ref 135–146)
Total Bilirubin: 0.4 mg/dL (ref 0.2–1.2)
Total Protein: 8.1 g/dL (ref 6.1–8.1)

## 2015-01-11 LAB — HEMOGLOBIN A1C
Hgb A1c MFr Bld: 5.9 % — ABNORMAL HIGH (ref <5.7)
Mean Plasma Glucose: 123 mg/dL — ABNORMAL HIGH (ref <117)

## 2015-01-11 NOTE — Telephone Encounter (Signed)
Per Dr.Rehman the patient will need to have labs drawn in 8 weeks. 

## 2015-01-14 NOTE — Assessment & Plan Note (Signed)
Uncontrolled, [pt reports intolerance whenever dose is increased and has had several medication adjustments attempted in the past, always not able to compky , due to"excess fatigue" and feeling drained. No interest in cardiology input  DASH diet and commitment to daily physical activity for a minimum of 30 minutes discussed and encouraged, as a part of hypertension management. The importance of attaining a healthy weight is also discussed.  BP/Weight 01/10/2015 08/30/2014 08/16/2014 08/09/2014 05/17/2014 04/04/2014 12/05/9831  Systolic BP 825 053 976 734 193 790 240  Diastolic BP 80 78 86 76 70 90 84  Wt. (Lbs) 171.12 - 172 173.2 173.1 170 171.2  BMI 28.48 - 28.62 28.82 28.36 28.29 28.49

## 2015-01-14 NOTE — Assessment & Plan Note (Signed)
Daily weight bearing physical activity and weekly bisphosphonate as well as calcium with D supplement, will need rept dexa in 2017

## 2015-01-14 NOTE — Assessment & Plan Note (Signed)
Hyperlipidemia:Low fat diet discussed and encouraged.   Lipid Panel  Lab Results  Component Value Date   CHOL 185 08/28/2014   HDL 57 08/28/2014   LDLCALC 115* 08/28/2014   TRIG 63 08/28/2014   CHOLHDL 3.2 08/28/2014

## 2015-01-14 NOTE — Assessment & Plan Note (Signed)
Asymptomatic watchful waiting, no surgical intervention proposed

## 2015-01-14 NOTE — Assessment & Plan Note (Signed)
Followed by and treated by gI, updated hepatic panel shows slight inc in liver enz, will make GI aware

## 2015-01-14 NOTE — Assessment & Plan Note (Signed)
Patient educated about the importance of limiting  Carbohydrate intake , the need to commit to daily physical activity for a minimum of 30 minutes , and to commit weight loss. The fact that changes in all these areas will reduce or eliminate all together the development of diabetes is stressed.  Unchanged, needs to continue to work on this  Diabetic Labs Latest Ref Rng 01/10/2015 08/28/2014 07/11/2013 12/23/2012 09/22/2012  HbA1c <5.7 % 5.9(H) 5.9(H) 5.4 - 5.6  Chol 0 - 200 mg/dL - 185 - 209(H) -  HDL >=46 mg/dL - 57 - 64 -  Calc LDL 0 - 99 mg/dL - 115(H) - 129(H) -  Triglycerides <150 mg/dL - 63 - 82 -  Creatinine 0.60 - 0.93 mg/dL 0.65 0.66 0.67 - 0.68   BP/Weight 01/10/2015 08/30/2014 08/16/2014 08/09/2014 05/17/2014 04/04/2014 0/14/1030  Systolic BP 131 438 887 579 728 206 015  Diastolic BP 80 78 86 76 70 90 84  Wt. (Lbs) 171.12 - 172 173.2 173.1 170 171.2  BMI 28.48 - 28.62 28.82 28.36 28.29 28.49   No flowsheet data found.

## 2015-01-14 NOTE — Assessment & Plan Note (Signed)
Reports 7 hrs of sleep,  Just stays up late till 2 am generally, but in bed until 9 to 10 am  Encouraged her to work on trying to go to bed earlier , but assured her that she does repot an adequate number of sleep hours r

## 2015-01-14 NOTE — Assessment & Plan Note (Signed)
Mild depression, not suicidal or homicidal.Definite anxiety , and repeatedly "fretted/ anxious " with regard to relationship with her spouse of over 27 years. Best case scenario involves therapy, but she is not interested currently, does not feel beneficial

## 2015-01-14 NOTE — Assessment & Plan Note (Signed)
Unable to adequately treat blood pressure as pt reports intolerance repeatedly cities fatigue. States at times she takes her BP med alternate days, despite repeated re education a to importance of taking daily, re educated again at this visit Refused flu vaccine at visit

## 2015-01-16 ENCOUNTER — Other Ambulatory Visit (INDEPENDENT_AMBULATORY_CARE_PROVIDER_SITE_OTHER): Payer: Self-pay | Admitting: *Deleted

## 2015-01-16 ENCOUNTER — Encounter (INDEPENDENT_AMBULATORY_CARE_PROVIDER_SITE_OTHER): Payer: Self-pay | Admitting: *Deleted

## 2015-01-16 DIAGNOSIS — K743 Primary biliary cirrhosis: Secondary | ICD-10-CM

## 2015-02-03 ENCOUNTER — Other Ambulatory Visit (INDEPENDENT_AMBULATORY_CARE_PROVIDER_SITE_OTHER): Payer: Self-pay | Admitting: Internal Medicine

## 2015-02-08 ENCOUNTER — Ambulatory Visit (INDEPENDENT_AMBULATORY_CARE_PROVIDER_SITE_OTHER): Payer: Medicare Other | Admitting: Internal Medicine

## 2015-02-08 ENCOUNTER — Encounter (INDEPENDENT_AMBULATORY_CARE_PROVIDER_SITE_OTHER): Payer: Self-pay | Admitting: Internal Medicine

## 2015-02-08 VITALS — BP 132/62 | HR 68 | Temp 98.2°F | Ht 65.0 in | Wt 172.0 lb

## 2015-02-08 DIAGNOSIS — K743 Primary biliary cirrhosis: Secondary | ICD-10-CM | POA: Diagnosis not present

## 2015-02-08 NOTE — Patient Instructions (Addendum)
OV in 6 months with labs.

## 2015-02-08 NOTE — Progress Notes (Signed)
Subjective:    Patient ID: Yolanda Foley, female    DOB: 03/17/1941, 74 y.o.   MRN: [id number]  HPIHere today for f/u of her PBS. She was last seen in April of this year. Hx of elevated transaminases and ALP. 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 She refused a liver biopsy. Her last colonoscopy was in 2011 and was normal by Dr. Laural Golden and was normal.  She is doing well. She tries to exercise 3 days a week. She works out at home by walking. Appetite is good. She has maintained her weight. There is no abdominal pain. She usually has BM daily. No melena or BRRB.  Stool was guaiac negative at Dr. Moshe Cipro last month.    Hepatic Function Latest Ref Rng 01/10/2015 08/08/2014 05/17/2014  Total Protein 6.1 - 8.1 g/dL 8.1 7.6 8.0  Albumin 3.6 - 5.1 g/dL 4.1 3.5 3.7  AST 10 - 35 U/L 38(H) 33 37  ALT 6 - 29 U/L 29 24 26   Alk Phosphatase 33 - 130 U/L 146(H) 126(H) 146(H)  Total Bilirubin 0.2 - 1.2 mg/dL 0.4 0.3 0.4  Bilirubin, Direct 0.0 - 0.3 mg/dL - 0.1 0.1       Hepatic Function Panel     Component Value Date/Time   PROT 8.1 01/10/2015 1638   ALBUMIN 4.1 01/10/2015 1638   AST 38* 01/10/2015 1638   ALT 29 01/10/2015 1638   ALKPHOS 146* 01/10/2015 1638   BILITOT 0.4 01/10/2015 1638   BILIDIR 0.1 08/08/2014 1632   IBILI 0.2 08/08/2014 1632   CBC    Component Value Date/Time   WBC 4.4 08/28/2014 1514   RBC 3.77* 08/28/2014 1514   HGB 10.9* 08/28/2014 1514   HCT 33.6* 08/28/2014 1514   PLT 171 08/28/2014 1514   MCV 89.1 08/28/2014 1514   MCH 28.9 08/28/2014 1514   MCHC 32.4 08/28/2014 1514   RDW 14.0 08/28/2014 1514   LYMPHSABS 1.7 12/23/2012 0940   MONOABS 0.3 12/23/2012 0940   EOSABS 0.1 12/23/2012 0940   BASOSABS 0.0 12/23/2012 0940           06/27/2010 Hep C antibody negative Hepatitis A antibody, Igm negative Hepatitis B surface antigen negative Hepatitis B Core Ab, igM negative   Review of Systems Past Medical History    Diagnosis Date  . Depression   . Chronic pain   . GERD (gastroesophageal reflux disease)   . Palpitation   . Hyperlipidemia   . Hypertension   . Nonspecific elevation of levels of transaminase or lactic acid dehydrogenase (LDH)   . Elevated liver enzymes   . Primary biliary cirrhosis [facility])     Past Surgical History  Procedure Laterality Date  . Tonsillectomy  1967   . Left ovarian cyst removal  1974  . Correction of nasal surgery  35 years ago     Allergies  Allergen Reactions  . [place]   . Fluoxetine Other (See Comments)    Dull headaache  . Sulfonamide Derivatives     Current Outpatient Prescriptions on File Prior to Visit  Medication Sig Dispense Refill  . alendronate (FOSAMAX) 70 MG tablet Take 1 tablet (70 mg total) by mouth every 7 (seven) days. Take with a full glass of water on an empty stomach. 4 tablet 11  . aspirin (ASPIRIN LOW DOSE) 81 MG EC tablet Take 81 mg by mouth as needed. Take one tablet by mouth once a day    .  Calcium Carbonate-Vitamin D (CALCIUM 600-D) 600-400 MG-UNIT per tablet Take 1 tablet by mouth daily. 90 tablet 6  . Chlorophyll 50 MG CAPS Take 3 capsules by mouth daily.    . Coenzyme Q10-Fish Oil-Vit E (CO-Q 10 OMEGA-3 FISH OIL) CAPS Take 2 capsules by mouth daily.    . Digestive Enzymes (PAPAYA ENZYME) CHEW Chew by mouth as needed.    Nyoka Cowden Tea, Camillia sinensis, (GREEN TEA PO) Take by mouth. Patient states that this is a decaf tea and she drinks it 1-2 times daily    . Methylsulfonylmethane (MSM) 500 MG CAPS Take by mouth 2 (two) times daily.    . Multiple Vitamin (MULTIVITAMIN PO) Take by mouth daily.      Marland Kitchen olmesartan (BENICAR) 20 MG tablet Take 1 tablet (20 mg total) by mouth daily. 30 tablet 11  . PAPAYA PO Take by mouth as needed.     . ursodiol (ACTIGALL) 300 MG capsule TAKE ONE CAPSULE BY MOUTH THREE TIMES DAILY AFTER A MEAL 90 capsule 5   No current facility-administered medications on file prior to visit.         Objective:   Physical Exam Blood pressure 132/62, pulse 68, temperature 98.2 F (36.8 C), height 5' 5"  (1.651 m), weight 172 lb (78.019 kg). Alert and oriented. Skin warm and dry. Oral mucosa is moist.   . Sclera anicteric, conjunctivae is pink. Thyroid not enlarged. No cervical lymphadenopathy. Lungs clear. Heart regular rate and rhythm.  Abdomen is soft. Bowel sounds are positive. No hepatomegaly. No abdominal masses felt. No tenderness.  No edema to lower extremities.         Assessment & Plan:  PBS. She is doing well. AST and ALP elevated. Am going to repeat today. OV in 6 months with a Hepatic function and CBC.

## 2015-02-09 ENCOUNTER — Telehealth (INDEPENDENT_AMBULATORY_CARE_PROVIDER_SITE_OTHER): Payer: Self-pay | Admitting: *Deleted

## 2015-02-09 DIAGNOSIS — K743 Primary biliary cirrhosis: Secondary | ICD-10-CM

## 2015-02-09 LAB — CBC
HCT: 33.8 % — ABNORMAL LOW (ref 36.0–46.0)
Hemoglobin: 10.9 g/dL — ABNORMAL LOW (ref 12.0–15.0)
MCH: 29 pg (ref 26.0–34.0)
MCHC: 32.2 g/dL (ref 30.0–36.0)
MCV: 89.9 fL (ref 78.0–100.0)
MPV: 12.3 fL (ref 8.6–12.4)
PLATELETS: 194 10*3/uL (ref 150–400)
RBC: 3.76 MIL/uL — ABNORMAL LOW (ref 3.87–5.11)
RDW: 14.2 % (ref 11.5–15.5)
WBC: 5.3 10*3/uL (ref 4.0–10.5)

## 2015-02-09 LAB — HEPATIC FUNCTION PANEL
ALBUMIN: 3.9 g/dL (ref 3.6–5.1)
ALK PHOS: 154 U/L — AB (ref 33–130)
ALT: 24 U/L (ref 6–29)
AST: 32 U/L (ref 10–35)
BILIRUBIN INDIRECT: 0.3 mg/dL (ref 0.2–1.2)
Bilirubin, Direct: 0.1 mg/dL (ref <=0.2)
TOTAL PROTEIN: 7.9 g/dL (ref 6.1–8.1)
Total Bilirubin: 0.4 mg/dL (ref 0.2–1.2)

## 2015-02-09 NOTE — Telephone Encounter (Signed)
.  Per Terri Setzer,NP patient to have labs in 6 months. 

## 2015-02-22 ENCOUNTER — Encounter (INDEPENDENT_AMBULATORY_CARE_PROVIDER_SITE_OTHER): Payer: Self-pay | Admitting: *Deleted

## 2015-02-22 ENCOUNTER — Other Ambulatory Visit (INDEPENDENT_AMBULATORY_CARE_PROVIDER_SITE_OTHER): Payer: Self-pay | Admitting: *Deleted

## 2015-02-22 DIAGNOSIS — K743 Primary biliary cirrhosis: Secondary | ICD-10-CM

## 2015-02-28 ENCOUNTER — Telehealth (INDEPENDENT_AMBULATORY_CARE_PROVIDER_SITE_OTHER): Payer: Self-pay | Admitting: *Deleted

## 2015-02-28 DIAGNOSIS — K743 Primary biliary cirrhosis: Secondary | ICD-10-CM

## 2015-02-28 NOTE — Telephone Encounter (Signed)
.  Per Terri Setzer,NP patient is to have labs in 6 months. 

## 2015-05-30 ENCOUNTER — Ambulatory Visit (INDEPENDENT_AMBULATORY_CARE_PROVIDER_SITE_OTHER): Payer: Medicare Other | Admitting: Family Medicine

## 2015-05-30 ENCOUNTER — Encounter: Payer: Self-pay | Admitting: Family Medicine

## 2015-05-30 VITALS — BP 154/90 | HR 78 | Resp 16 | Ht 65.0 in | Wt 171.0 lb

## 2015-05-30 DIAGNOSIS — Z9181 History of falling: Secondary | ICD-10-CM

## 2015-05-30 DIAGNOSIS — D649 Anemia, unspecified: Secondary | ICD-10-CM | POA: Diagnosis not present

## 2015-05-30 DIAGNOSIS — Z23 Encounter for immunization: Secondary | ICD-10-CM | POA: Diagnosis not present

## 2015-05-30 DIAGNOSIS — Z0000 Encounter for general adult medical examination without abnormal findings: Secondary | ICD-10-CM

## 2015-05-30 DIAGNOSIS — M810 Age-related osteoporosis without current pathological fracture: Secondary | ICD-10-CM

## 2015-05-30 DIAGNOSIS — Z1231 Encounter for screening mammogram for malignant neoplasm of breast: Secondary | ICD-10-CM

## 2015-05-30 DIAGNOSIS — R296 Repeated falls: Secondary | ICD-10-CM

## 2015-05-30 DIAGNOSIS — R7302 Impaired glucose tolerance (oral): Secondary | ICD-10-CM

## 2015-05-30 DIAGNOSIS — R7303 Prediabetes: Secondary | ICD-10-CM | POA: Diagnosis not present

## 2015-05-30 DIAGNOSIS — I10 Essential (primary) hypertension: Secondary | ICD-10-CM

## 2015-05-30 MED ORDER — ALENDRONATE SODIUM 70 MG PO TABS: 70.0000 mg | ORAL_TABLET | ORAL | Status: DC

## 2015-05-30 NOTE — Patient Instructions (Signed)
Annual physical exam in 6 month, call if you need me before   Prevnar today  Come for flu vaccine next 3 weeks if you decide  Fasting labs next week  Please work on lhealthy lifestyle, no falls and living will   Fall Prevention in the [place] can cause injuries. They can happen to people of all ages. There are many things you can do to make your home safe and to help prevent falls.  WHAT CAN I DO ON THE OUTSIDE OF MY HOME?  Regularly fix the edges of walkways and driveways and fix any cracks.  Remove anything that might make you trip as you walk through a door, such as a raised step or threshold.  Trim any bushes or trees on the path to your home.  Use bright outdoor lighting.  Clear any walking paths of anything that might make someone trip, such as rocks or tools.  Regularly check to see if handrails are loose or broken. Make sure that both sides of any steps have handrails.  Any raised decks and porches should have guardrails on the edges.  Have any leaves, snow, or ice cleared regularly.  Use sand or salt on walking paths during winter.  Clean up any spills in your garage right away. This includes oil or grease spills. WHAT CAN I DO IN THE BATHROOM?   Use night lights.  Install grab bars by the toilet and in the tub and shower. Do not use towel bars as grab bars.  Use non-skid mats or decals in the tub or shower.  If you need to sit down in the shower, use a plastic, non-slip stool.  Keep the floor dry. Clean up any water that spills on the floor as soon as it happens.  Remove soap buildup in the tub or shower regularly.  Attach bath mats securely with double-sided non-slip rug tape.  Do not have throw rugs and other things on the floor that can make you trip. WHAT CAN I DO IN THE BEDROOM?  Use night lights.  Make sure that you have a light by your bed that is easy to reach.  Do not use any sheets or blankets that are too big for your bed. They should  not hang down onto the floor.  Have a firm chair that has side arms. You can use this for support while you get dressed.  Do not have throw rugs and other things on the floor that can make you trip. WHAT CAN I DO IN THE KITCHEN?  Clean up any spills right away.  Avoid walking on wet floors.  Keep items that you use a lot in easy-to-reach places.  If you need to reach something above you, use a strong step stool that has a grab bar.  Keep electrical cords out of the way.  Do not use floor polish or wax that makes floors slippery. If you must use wax, use non-skid floor wax.  Do not have throw rugs and other things on the floor that can make you trip. WHAT CAN I DO WITH MY STAIRS?  Do not leave any items on the stairs.  Make sure that there are handrails on both sides of the stairs and use them. Fix handrails that are broken or loose. Make sure that handrails are as long as the stairways.  Check any carpeting to make sure that it is firmly attached to the stairs. Fix any carpet that is loose or worn.  Avoid having  throw rugs at the top or bottom of the stairs. If you do have throw rugs, attach them to the floor with carpet tape.  Make sure that you have a light switch at the top of the stairs and the bottom of the stairs. If you do not have them, ask someone to add them for you. WHAT ELSE CAN I DO TO HELP PREVENT FALLS?  Wear shoes that:  Do not have high heels.  Have rubber bottoms.  Are comfortable and fit you well.  Are closed at the toe. Do not wear sandals.  If you use a stepladder:  Make sure that it is fully opened. Do not climb a closed stepladder.  Make sure that both sides of the stepladder are locked into place.  Ask someone to hold it for you, if possible.  Clearly mark and make sure that you can see:  Any grab bars or handrails.  First and last steps.  Where the edge of each step is.  Use tools that help you move around (mobility aids) if they are  needed. These include:  Canes.  Walkers.  Scooters.  Crutches.  Turn on the lights when you go into a dark area. Replace any light bulbs as soon as they burn out.  Set up your furniture so you have a clear path. Avoid moving your furniture around.  If any of your floors are uneven, fix them.  If there are any pets around you, be aware of where they are.  Review your medicines with your doctor. Some medicines can make you feel dizzy. This can increase your chance of falling. Ask your doctor what other things that you can do to help prevent falls.   This information is not intended to replace advice given to you by your health care provider. Make sure you discuss any questions you have with your health care provider.   Document Released: 02/08/2009 Document Revised: 08/29/2014 Document Reviewed: 05/19/2014 Elsevier Interactive Patient Education [employer].

## 2015-05-30 NOTE — Progress Notes (Signed)
Subjective:    Patient ID: Yolanda Foley, female    DOB: [birth year], 75 y.o.   MRN: [id number]  HPI  Preventive Screening-Counseling & Management   Patient present here today for a Medicare annual wellness visit.   Current Problems (verified)   Medications Prior to Visit Allergies (verified)   PAST HISTORY  Family History (verified)   Social History Married for 68 years, 4 children, retired from [employer], never a smoker or used alcohol    Risk Factors  Current exercise habits: Silver sneaker aerobics 2 days a week   Dietary issues discussed: heart healthy diet encouraged, limits fried fatty foods and eats more fruits and vegetables    Cardiac risk factors: none   Depression Screen  (Note: if answer to either of the following is "Yes", a more complete depression screening is indicated)   Over the past two weeks, have you felt down, depressed or hopeless? No  Over the past two weeks, have you felt little interest or pleasure in doing things? No  Have you lost interest or pleasure in daily life? No  Do you often feel hopeless? No  Do you cry easily over simple problems? No   Activities of Daily Living  In your present state of health, do you have any difficulty performing the following activities?  Driving?: No Managing money?: No Feeding yourself?:No Getting from bed to chair?:No Climbing a flight of stairs?:No Preparing food and eating?:No Bathing or showering?:No Getting dressed?:No Getting to the toilet?:No Using the toilet?:No Moving around from place to place?: No  Fall Risk Assessment In the past year have you fallen or had a near fall?: 2 falls  Are you currently taking any medications that make you dizzy?:No   Hearing Difficulties: No Do you often ask people to speak up or repeat themselves?:sometimes  Do you experience ringing or noises in your ears?:No Do you have difficulty understanding soft or whispered voices?:sometimes   Cognitive  Testing  Alert? Yes Normal Appearance?Yes  Oriented to person? Yes Place? Yes  Time? Yes  Displays appropriate judgment?Yes  Can read the correct time from a watch face? yes Are you having problems remembering things? Some, her mother had dementia   Advanced Directives have been discussed with the patient?Yes, will give brochure on how to get started  . Full code   List the Names of Other Physician/Practitioners you currently use:  Dr Laural Golden (GI)    Indicate any recent Medical Services you may have received from other than [facility] providers in the past year (date may be approximate).   Assessment:    Annual Wellness Exam   Plan:    Patient Instructions (the written plan) was given to the patient.  Medicare Attestation  I have personally reviewed:  The patient's medical and social history  Their use of alcohol, tobacco or illicit drugs  Their current medications and supplements  The patient's functional ability including ADLs,fall risks, home safety risks, cognitive, and hearing and visual impairment  Diet and physical activities  Evidence for depression or mood disorders  The patient's weight, height, BMI, and visual acuity have been recorded in the chart. I have made referrals, counseling, and provided education to the patient based on review of the above and I have provided the patient with a written personalized care plan for preventive services.     Review of Systems     Objective:   Physical Exam BP 154/90 mmHg  Pulse 78  Resp 16  Ht 5\' 5"  (  1.651 m)  Wt 171 lb (77.565 kg)  BMI 28.46 kg/m2  SpO2 96%        Assessment & Plan:  Need for vaccination with 13-polyvalent pneumococcal conjugate vaccine After obtaining informed consent, the vaccine is  administered by LPN.   Medicare annual wellness visit, subsequent Annual exam as documented. Counseling done  re healthy lifestyle involving commitment to 150 minutes exercise per week, heart healthy diet, and  attaining healthy weight.The importance of adequate sleep also discussed. Regular seat belt use and home safety, is also discussed. Changes in health habits are decided on by the patient with goals and time frames  set for achieving them. Immunization and cancer screening needs are specifically addressed at this visit.   Essential hypertension Uncontrolled, however in the past has demonstrted ongoing and continued intolerance to increased doses of antihypertensive meds, and maintains normal bP athome. Npo med change at this visit DASH diet and commitment to daily physical activity for a minimum of 30 minutes discussed and encouraged, as a part of hypertension management. The importance of attaining a healthy weight is also discussed.  BP/Weight 05/30/2015 02/08/2015 01/10/2015 08/30/2014 08/16/2014 08/09/2014 Q000111Q  Systolic BP 123456 Q000111Q [id number] [id number] 123456  Diastolic BP 90 62 80 78 86 76 70  Wt. (Lbs) 171 172 171.12 - 172 173.2 173.1  BMI 28.46 28.62 28.48 - 28.62 28.82 28.36        At high risk for falls Three falls in the past year places her at high risk. Safety and fall risk reduction discussed and literature provided

## 2015-05-30 NOTE — Assessment & Plan Note (Signed)
After obtaining informed consent, the vaccine is  administered by LPN.  

## 2015-06-03 ENCOUNTER — Encounter: Payer: Self-pay | Admitting: Family Medicine

## 2015-06-03 DIAGNOSIS — Z9181 History of falling: Secondary | ICD-10-CM | POA: Insufficient documentation

## 2015-06-03 NOTE — Assessment & Plan Note (Signed)
Uncontrolled, however in the past has demonstrted ongoing and continued intolerance to increased doses of antihypertensive meds, and maintains normal bP athome. Npo med change at this visit DASH diet and commitment to daily physical activity for a minimum of 30 minutes discussed and encouraged, as a part of hypertension management. The importance of attaining a healthy weight is also discussed.  BP/Weight 05/30/2015 02/08/2015 01/10/2015 08/30/2014 08/16/2014 08/09/2014 Q000111Q  Systolic BP 123456 Q000111Q [id number] Q000111Q 123456  Diastolic BP 90 62 80 78 86 76 70  Wt. (Lbs) 171 172 171.12 - 172 173.2 173.1  BMI 28.46 28.62 28.48 - 28.62 28.82 28.36

## 2015-06-03 NOTE — Assessment & Plan Note (Signed)
Three falls in the past year places her at high risk. Safety and fall risk reduction discussed and literature provided

## 2015-06-03 NOTE — Assessment & Plan Note (Signed)

## 2015-07-25 ENCOUNTER — Encounter (INDEPENDENT_AMBULATORY_CARE_PROVIDER_SITE_OTHER): Payer: Self-pay | Admitting: *Deleted

## 2015-07-25 ENCOUNTER — Other Ambulatory Visit (INDEPENDENT_AMBULATORY_CARE_PROVIDER_SITE_OTHER): Payer: Self-pay | Admitting: *Deleted

## 2015-07-25 DIAGNOSIS — K743 Primary biliary cirrhosis: Secondary | ICD-10-CM

## 2015-08-08 DIAGNOSIS — K743 Primary biliary cirrhosis: Secondary | ICD-10-CM | POA: Diagnosis not present

## 2015-08-08 LAB — CBC WITH DIFFERENTIAL/PLATELET
Basophils Absolute: 0 cells/uL (ref 0–200)
Basophils Relative: 0 %
Eosinophils Absolute: 90 cells/uL (ref 15–500)
Eosinophils Relative: 2 %
HEMATOCRIT: 32.6 % — AB (ref 35.0–45.0)
Hemoglobin: 10.5 g/dL — ABNORMAL LOW (ref 11.7–15.5)
LYMPHS PCT: 32 %
Lymphs Abs: 1440 cells/uL (ref 850–3900)
MCH: 28.8 pg (ref 27.0–33.0)
MCHC: 32.2 g/dL (ref 32.0–36.0)
MCV: 89.6 fL (ref 80.0–100.0)
MONO ABS: 450 {cells}/uL (ref 200–950)
MONOS PCT: 10 %
MPV: 11.7 fL (ref 7.5–12.5)
NEUTROS PCT: 56 %
Neutro Abs: 2520 cells/uL (ref 1500–7800)
Platelets: 180 10*3/uL (ref 140–400)
RBC: 3.64 MIL/uL — AB (ref 3.80–5.10)
RDW: 14.2 % (ref 11.0–15.0)
WBC: 4.5 10*3/uL (ref 3.8–10.8)

## 2015-08-09 ENCOUNTER — Encounter (INDEPENDENT_AMBULATORY_CARE_PROVIDER_SITE_OTHER): Payer: Self-pay | Admitting: Internal Medicine

## 2015-08-09 ENCOUNTER — Ambulatory Visit (INDEPENDENT_AMBULATORY_CARE_PROVIDER_SITE_OTHER): Payer: Medicare Other | Admitting: Internal Medicine

## 2015-08-09 ENCOUNTER — Encounter (INDEPENDENT_AMBULATORY_CARE_PROVIDER_SITE_OTHER): Payer: Self-pay | Admitting: *Deleted

## 2015-08-09 VITALS — BP 130/60 | HR 64 | Temp 98.3°F | Ht 65.5 in | Wt 171.3 lb

## 2015-08-09 DIAGNOSIS — K743 Primary biliary cirrhosis: Secondary | ICD-10-CM | POA: Diagnosis not present

## 2015-08-09 LAB — HEPATIC FUNCTION PANEL
ALK PHOS: 121 U/L (ref 33–130)
ALT: 24 U/L (ref 6–29)
AST: 30 U/L (ref 10–35)
Albumin: 3.9 g/dL (ref 3.6–5.1)
BILIRUBIN DIRECT: 0.1 mg/dL (ref <=0.2)
BILIRUBIN INDIRECT: 0.3 mg/dL (ref 0.2–1.2)
Total Bilirubin: 0.4 mg/dL (ref 0.2–1.2)
Total Protein: 7.5 g/dL (ref 6.1–8.1)

## 2015-08-09 NOTE — Progress Notes (Signed)
Subjective:    Patient ID: Yolanda Foley, female    DOB: [birth year], 75 y.o.   MRN: [id number]  HPI    HPIHere today for f/u of her PBS. She was last seen in October of 2016.Marland Kitchen Hx of elevated transaminases and ALP. 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 She refused a liver biopsy. Her last colonoscopy was in 2011 and was normal by Dr. Laural Golden . She is doing well. She tries to exercise 3 days a week x 1 hr each day.  She works out at home by walking. Appetite is good. She has maintained her weight. There is no abdominal pain. She usually has BM daily. No melena or BRRB. No yellowing of the skin.   CBC Latest Ref Rng 08/08/2015 02/08/2015 08/28/2014  WBC 3.8 - 10.8 K/uL 4.5 5.3 4.4  Hemoglobin 11.7 - 15.5 g/dL 10.5(L) 10.9(L) 10.9(L)  Hematocrit 35.0 - 45.0 % 32.6(L) 33.8(L) 33.6(L)  Platelets 140 - 400 K/uL 180 194 171      CBC    Component Value Date/Time   WBC 4.5 08/08/2015 1633   RBC 3.64* 08/08/2015 1633   HGB 10.5* 08/08/2015 1633   HCT 32.6* 08/08/2015 1633   PLT 180 08/08/2015 1633   MCV 89.6 08/08/2015 1633   MCH 28.8 08/08/2015 1633   MCHC 32.2 08/08/2015 1633   RDW 14.2 08/08/2015 1633   LYMPHSABS 1440 08/08/2015 1633   MONOABS 450 08/08/2015 1633   EOSABS 90 08/08/2015 1633   BASOSABS 0 08/08/2015 1633         Hepatic Function Latest Ref Rng 08/08/2015 02/08/2015 01/10/2015  Total Protein 6.1 - 8.1 g/dL 7.5 7.9 8.1  Albumin 3.6 - 5.1 g/dL 3.9 3.9 4.1  AST 10 - 35 U/L 30 32 38(H)  ALT 6 - 29 U/L _0 Alk Phosphatase 33 - 130 U/L 121 154(H) 146(H)  Total Bilirubin 0.2 - 1.2 mg/dL 0.4 0.4 0.4  Bilirubin, Direct <=0.2 mg/dL 0.1 0.1 -      Review of Systems Past Medical History  Diagnosis Date  . Depression   . Chronic pain   . GERD (gastroesophageal reflux disease)   . Palpitation   . Hyperlipidemia   . Hypertension   . Nonspecific elevation of levels of transaminase or lactic acid dehydrogenase (LDH)    . Elevated liver enzymes   . Primary biliary cirrhosis ([place])   . Cholelithiasis     Past Surgical History  Procedure Laterality Date  . Tonsillectomy  1967   . Left ovarian cyst removal  1974  . Correction of nasal surgery  35 years ago     Allergies  Allergen Reactions  . Gordon   . Fluoxetine Other (See Comments)    Dull headaache  . Sulfonamide Derivatives     Current Outpatient Prescriptions on File Prior to Visit  Medication Sig Dispense Refill  . alendronate (FOSAMAX) 70 MG tablet Take 1 tablet (70 mg total) by mouth every 7 (seven) days. Take with a full glass of water on an empty stomach. 4 tablet 6  . aspirin (ASPIRIN LOW DOSE) 81 MG EC tablet Take 81 mg by mouth as needed. Take one tablet by mouth once a day    . Calcium Carbonate-Vitamin D (CALCIUM 600-D) 600-400 MG-UNIT per tablet Take 1 tablet by mouth daily. 90 tablet 6  . Chlorophyll 50 MG CAPS Take 3 capsules by mouth daily.    Marland Kitchen  Coenzyme Q10-Fish Oil-Vit E (CO-Q 10 OMEGA-3 FISH OIL) CAPS Take 2 capsules by mouth daily.    . cyanocobalamin 1000 MCG tablet Take 100 mcg by mouth daily.    . Digestive Enzymes (PAPAYA ENZYME) CHEW Chew by mouth as needed.    . ferrous sulfate 325 (65 FE) MG EC tablet Take 325 mg by mouth daily with breakfast.    . Nyoka Cowden Tea, Camillia sinensis, (GREEN TEA PO) Take by mouth. Patient states that this is a decaf tea and she drinks it 1-2 times daily    . Methylsulfonylmethane (MSM) 500 MG CAPS Take by mouth 2 (two) times daily.    . Multiple Vitamin (MULTIVITAMIN PO) Take by mouth daily.      Marland Kitchen olmesartan (BENICAR) 20 MG tablet Take 1 tablet (20 mg total) by mouth daily. 30 tablet 11  . PAPAYA PO Take by mouth as needed.     . ursodiol (ACTIGALL) 300 MG capsule TAKE ONE CAPSULE BY MOUTH THREE TIMES DAILY AFTER A MEAL 90 capsule 5  . vitamin C (ASCORBIC ACID) 500 MG tablet Take 500 mg by mouth 2 (two) times daily.     No current facility-administered medications on file  prior to visit.        Objective:   Physical Exam Blood pressure 130/60, pulse 64, temperature 98.3 F (36.8 C), height 5' 5.5" (1.664 m), weight 171 lb 4.8 oz (77.701 kg).  Alert and oriented. Skin warm and dry. Oral mucosa is moist.   . Sclera anicteric, conjunctivae is pink. Thyroid not enlarged. No cervical lymphadenopathy. Lungs clear. Heart regular rate and rhythm.  Abdomen is soft. Bowel sounds are positive. No hepatomegaly. No abdominal masses felt. No tenderness.  No edema to lower extremities.        Assessment & Plan:  PBC. She seems to be in remission. Her liver enzymes are normal.  US abdomen elastrography OV in 6 months.

## 2015-08-09 NOTE — Patient Instructions (Addendum)
OV in 6 months. [place] RUQ

## 2015-08-16 ENCOUNTER — Ambulatory Visit (HOSPITAL_COMMUNITY)
Admission: RE | Admit: 2015-08-16 | Discharge: 2015-08-16 | Disposition: A | Discharge status: Home / Self Care | Payer: Medicare Other | Source: Ambulatory Visit | Attending: Internal Medicine | Admitting: Internal Medicine

## 2015-08-16 DIAGNOSIS — K8020 Calculus of gallbladder without cholecystitis without obstruction: Secondary | ICD-10-CM | POA: Diagnosis not present

## 2015-08-16 DIAGNOSIS — K743 Primary biliary cirrhosis: Secondary | ICD-10-CM | POA: Insufficient documentation

## 2015-08-16 DIAGNOSIS — K7460 Unspecified cirrhosis of liver: Secondary | ICD-10-CM | POA: Insufficient documentation

## 2015-08-22 ENCOUNTER — Encounter (INDEPENDENT_AMBULATORY_CARE_PROVIDER_SITE_OTHER): Payer: Self-pay | Admitting: *Deleted

## 2015-08-22 ENCOUNTER — Other Ambulatory Visit (INDEPENDENT_AMBULATORY_CARE_PROVIDER_SITE_OTHER): Payer: Self-pay | Admitting: *Deleted

## 2015-08-22 DIAGNOSIS — K743 Primary biliary cirrhosis: Secondary | ICD-10-CM

## 2015-10-10 ENCOUNTER — Other Ambulatory Visit: Payer: Self-pay | Admitting: Family Medicine

## 2015-10-31 ENCOUNTER — Other Ambulatory Visit: Payer: Self-pay | Admitting: Family Medicine

## 2015-11-05 ENCOUNTER — Other Ambulatory Visit: Payer: Self-pay

## 2015-11-28 ENCOUNTER — Encounter: Payer: Self-pay | Admitting: Family Medicine

## 2016-02-06 ENCOUNTER — Ambulatory Visit (INDEPENDENT_AMBULATORY_CARE_PROVIDER_SITE_OTHER): Payer: Self-pay | Admitting: Internal Medicine

## 2016-02-06 ENCOUNTER — Encounter (INDEPENDENT_AMBULATORY_CARE_PROVIDER_SITE_OTHER): Payer: Self-pay | Admitting: Internal Medicine

## 2016-02-07 ENCOUNTER — Ambulatory Visit (INDEPENDENT_AMBULATORY_CARE_PROVIDER_SITE_OTHER): Payer: Self-pay | Admitting: Internal Medicine

## 2016-02-11 ENCOUNTER — Encounter (INDEPENDENT_AMBULATORY_CARE_PROVIDER_SITE_OTHER): Payer: Self-pay | Admitting: Internal Medicine

## 2016-02-11 ENCOUNTER — Ambulatory Visit (INDEPENDENT_AMBULATORY_CARE_PROVIDER_SITE_OTHER): Payer: Medicare Other | Admitting: Internal Medicine

## 2016-02-11 VITALS — BP 150/80 | HR 72 | Ht 65.0 in | Wt 166.2 lb

## 2016-02-11 DIAGNOSIS — K743 Primary biliary cirrhosis: Secondary | ICD-10-CM | POA: Diagnosis not present

## 2016-02-11 DIAGNOSIS — K745 Biliary cirrhosis, unspecified: Secondary | ICD-10-CM

## 2016-02-11 NOTE — Progress Notes (Signed)
Subjective:    Patient ID: Yolanda Foley, female    DOB: [birth year], 75 y.o.   MRN: [id number]  HPI Here today for f/u of her PBC  Last seen in April of this year. 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 Liver enzymes in April of this year were normal. Maintained or Actigall 343m TID Last colonoscopy in 2011 and was normal.  She tells me today she feels tired. She thinks her hemoglobin is low.  Hx of anemia. Presently taking Multivitamin with Iron.   08/16/2015 UKoreaelastrography: F2 and F3.    CBC    Component Value Date/Time   WBC 4.5 08/08/2015 1633   RBC 3.64 (L) 08/08/2015 1633   HGB 10.5 (L) 08/08/2015 1633   HCT 32.6 (L) 08/08/2015 1633   PLT 180 08/08/2015 1633   MCV 89.6 08/08/2015 1633   MCH 28.8 08/08/2015 1633   MCHC 32.2 08/08/2015 1633   RDW 14.2 08/08/2015 1633   LYMPHSABS 1,440 08/08/2015 1633   MONOABS 450 08/08/2015 1633   EOSABS 90 08/08/2015 1633   BASOSABS 0 08/08/2015 1633   Hepatic Function Latest Ref Rng & Units 08/08/2015 02/08/2015 01/10/2015  Total Protein 6.1 - 8.1 g/dL 7.5 7.9 8.1  Albumin 3.6 - 5.1 g/dL 3.9 3.9 4.1  AST 10 - 35 U/L 30 32 38(H)  ALT 6 - 29 U/L 24 24 29   Alk Phosphatase 33 - 130 U/L 121 154(H) 146(H)  Total Bilirubin 0.2 - 1.2 mg/dL 0.4 0.4 0.4  Bilirubin, Direct <=0.2 mg/dL 0.1 0.1 -        Review of Systems Past Medical History:  Diagnosis Date  . Cholelithiasis   . Chronic pain   . Depression   . Elevated liver enzymes   . GERD (gastroesophageal reflux disease)   . Hyperlipidemia   . Hypertension   . Nonspecific elevation of levels of transaminase or lactic acid dehydrogenase (LDH)   . Palpitation   . Primary biliary cirrhosis     Past Surgical History:  Procedure Laterality Date  . correction of nasal surgery  35 years ago   . Left ovarian cyst removal  1974  . TONSILLECTOMY  1967     Allergies  Allergen Reactions  . FBronx  . Fluoxetine Other (See  Comments)    Dull headaache  . Sulfonamide Derivatives     Current Outpatient Prescriptions on File Prior to Visit  Medication Sig Dispense Refill  . alendronate (FOSAMAX) 70 MG tablet Take 1 tablet (70 mg total) by mouth every 7 (seven) days. Take with a full glass of water on an empty stomach. 4 tablet 6  . aspirin (ASPIRIN LOW DOSE) 81 MG EC tablet Take 81 mg by mouth as needed. Take one tablet by mouth once a day    . Calcium Carbonate-Vitamin D (CALCIUM 600-D) 600-400 MG-UNIT per tablet Take 1 tablet by mouth daily. 90 tablet 6  . Chlorophyll 50 MG CAPS Take 3 capsules by mouth daily.    . Coenzyme Q10-Fish Oil-Vit E (CO-Q 10 OMEGA-3 FISH OIL) CAPS Take 2 capsules by mouth daily.    . cyanocobalamin 1000 MCG tablet Take 100 mcg by mouth daily.    . Digestive Enzymes (PAPAYA ENZYME) CHEW Chew by mouth as needed.    . ferrous sulfate 325 (65 FE) MG EC tablet Take 325 mg by mouth daily with breakfast.    . GNyoka CowdenTea, Camillia sinensis, (GREEN TEA  PO) Take by mouth. Patient states that this is a decaf tea and she drinks it 1-2 times daily    . Methylsulfonylmethane (MSM) 500 MG CAPS Take by mouth 2 (two) times daily.    . Multiple Vitamin (MULTIVITAMIN PO) Take by mouth daily.      Marland Kitchen olmesartan (BENICAR) 20 MG tablet TAKE ONE TABLET BY MOUTH ONCE DAILY 30 tablet 0  . PAPAYA PO Take by mouth as needed.     . ursodiol (ACTIGALL) 300 MG capsule TAKE ONE CAPSULE BY MOUTH THREE TIMES DAILY AFTER A MEAL 90 capsule 5  . vitamin B-12 (CYANOCOBALAMIN) 1000 MCG tablet Take 1,000 mcg by mouth daily.    . vitamin C (ASCORBIC ACID) 500 MG tablet Take 500 mg by mouth 2 (two) times daily.     No current facility-administered medications on file prior to visit.        Objective:   Physical Exam Blood pressure (!) 150/80, pulse 72, height 5' 5"  (1.651 m), weight 166 lb 3.2 oz (75.4 kg). Alert and oriented. Skin warm and dry. Oral mucosa is moist.   . Sclera anicteric, conjunctivae is pink. Thyroid not  enlarged. No cervical lymphadenopathy. Lungs clear. Heart regular rate and rhythm.  Abdomen is soft. Bowel sounds are positive. No hepatomegaly. No abdominal masses felt. No tenderness.  No edema to lower extremities.  .        Assessment & Plan:  PBC. Will get a CBC, Hepatic,  OV in 6 months.

## 2016-02-11 NOTE — Patient Instructions (Signed)
OV in 6 months. 

## 2016-02-12 LAB — CBC WITH DIFFERENTIAL/PLATELET
BASOS ABS: 0 {cells}/uL (ref 0–200)
BASOS PCT: 0 %
EOS ABS: 98 {cells}/uL (ref 15–500)
Eosinophils Relative: 2 %
HEMATOCRIT: 34.2 % — AB (ref 35.0–45.0)
Hemoglobin: 10.8 g/dL — ABNORMAL LOW (ref 11.7–15.5)
LYMPHS PCT: 28 %
Lymphs Abs: 1372 cells/uL (ref 850–3900)
MCH: 28.4 pg (ref 27.0–33.0)
MCHC: 31.6 g/dL — ABNORMAL LOW (ref 32.0–36.0)
MCV: 90 fL (ref 80.0–100.0)
MONO ABS: 294 {cells}/uL (ref 200–950)
MPV: 11.4 fL (ref 7.5–12.5)
Monocytes Relative: 6 %
NEUTROS PCT: 64 %
Neutro Abs: 3136 cells/uL (ref 1500–7800)
Platelets: 183 10*3/uL (ref 140–400)
RBC: 3.8 MIL/uL (ref 3.80–5.10)
RDW: 14.1 % (ref 11.0–15.0)
WBC: 4.9 10*3/uL (ref 3.8–10.8)

## 2016-02-12 LAB — HEPATIC FUNCTION PANEL
ALBUMIN: 3.7 g/dL (ref 3.6–5.1)
ALT: 35 U/L — AB (ref 6–29)
AST: 45 U/L — ABNORMAL HIGH (ref 10–35)
Alkaline Phosphatase: 215 U/L — ABNORMAL HIGH (ref 33–130)
BILIRUBIN INDIRECT: 0.3 mg/dL (ref 0.2–1.2)
Bilirubin, Direct: 0.1 mg/dL (ref <=0.2)
TOTAL PROTEIN: 7.9 g/dL (ref 6.1–8.1)
Total Bilirubin: 0.4 mg/dL (ref 0.2–1.2)

## 2016-02-15 ENCOUNTER — Other Ambulatory Visit (INDEPENDENT_AMBULATORY_CARE_PROVIDER_SITE_OTHER): Payer: Self-pay | Admitting: *Deleted

## 2016-02-15 DIAGNOSIS — K745 Biliary cirrhosis, unspecified: Secondary | ICD-10-CM

## 2016-02-21 ENCOUNTER — Other Ambulatory Visit (INDEPENDENT_AMBULATORY_CARE_PROVIDER_SITE_OTHER): Payer: Self-pay | Admitting: *Deleted

## 2016-02-21 ENCOUNTER — Encounter (INDEPENDENT_AMBULATORY_CARE_PROVIDER_SITE_OTHER): Payer: Self-pay | Admitting: *Deleted

## 2016-02-21 DIAGNOSIS — K745 Biliary cirrhosis, unspecified: Secondary | ICD-10-CM

## 2016-02-27 ENCOUNTER — Other Ambulatory Visit (INDEPENDENT_AMBULATORY_CARE_PROVIDER_SITE_OTHER): Payer: Self-pay | Admitting: Internal Medicine

## 2016-03-14 DIAGNOSIS — K745 Biliary cirrhosis, unspecified: Secondary | ICD-10-CM | POA: Diagnosis not present

## 2016-03-15 LAB — HEPATIC FUNCTION PANEL
ALT: 21 U/L (ref 6–29)
AST: 30 U/L (ref 10–35)
Albumin: 3.8 g/dL (ref 3.6–5.1)
Alkaline Phosphatase: 155 U/L — ABNORMAL HIGH (ref 33–130)
BILIRUBIN DIRECT: 0.1 mg/dL (ref <=0.2)
BILIRUBIN INDIRECT: 0.3 mg/dL (ref 0.2–1.2)
BILIRUBIN TOTAL: 0.4 mg/dL (ref 0.2–1.2)
Total Protein: 7.6 g/dL (ref 6.1–8.1)

## 2016-03-17 ENCOUNTER — Encounter (INDEPENDENT_AMBULATORY_CARE_PROVIDER_SITE_OTHER): Payer: Self-pay | Admitting: *Deleted

## 2016-03-17 ENCOUNTER — Other Ambulatory Visit (INDEPENDENT_AMBULATORY_CARE_PROVIDER_SITE_OTHER): Payer: Self-pay | Admitting: *Deleted

## 2016-03-17 DIAGNOSIS — K745 Biliary cirrhosis, unspecified: Secondary | ICD-10-CM

## 2016-05-16 DIAGNOSIS — K745 Biliary cirrhosis, unspecified: Secondary | ICD-10-CM | POA: Diagnosis not present

## 2016-05-17 LAB — HEPATIC FUNCTION PANEL
ALT: 18 U/L (ref 6–29)
AST: 27 U/L (ref 10–35)
Albumin: 3.9 g/dL (ref 3.6–5.1)
Alkaline Phosphatase: 123 U/L (ref 33–130)
BILIRUBIN DIRECT: 0.1 mg/dL (ref <=0.2)
BILIRUBIN INDIRECT: 0.3 mg/dL (ref 0.2–1.2)
BILIRUBIN TOTAL: 0.4 mg/dL (ref 0.2–1.2)
Total Protein: 7.6 g/dL (ref 6.1–8.1)

## 2016-05-19 ENCOUNTER — Other Ambulatory Visit (INDEPENDENT_AMBULATORY_CARE_PROVIDER_SITE_OTHER): Payer: Self-pay | Admitting: *Deleted

## 2016-05-19 ENCOUNTER — Telehealth (INDEPENDENT_AMBULATORY_CARE_PROVIDER_SITE_OTHER): Payer: Self-pay | Admitting: Internal Medicine

## 2016-05-19 DIAGNOSIS — D508 Other iron deficiency anemias: Secondary | ICD-10-CM

## 2016-05-19 DIAGNOSIS — K743 Primary biliary cirrhosis: Secondary | ICD-10-CM

## 2016-05-19 NOTE — Telephone Encounter (Signed)
Lab is noted for 06/02/2016. A letter will be sent to the patient as a reminder.

## 2016-05-19 NOTE — Telephone Encounter (Signed)
Tammy, CBC in 2 weeks. Please send letter

## 2016-05-22 ENCOUNTER — Other Ambulatory Visit (INDEPENDENT_AMBULATORY_CARE_PROVIDER_SITE_OTHER): Payer: Self-pay | Admitting: *Deleted

## 2016-05-22 ENCOUNTER — Encounter (INDEPENDENT_AMBULATORY_CARE_PROVIDER_SITE_OTHER): Payer: Self-pay | Admitting: *Deleted

## 2016-05-22 DIAGNOSIS — D508 Other iron deficiency anemias: Secondary | ICD-10-CM

## 2016-05-22 DIAGNOSIS — K743 Primary biliary cirrhosis: Secondary | ICD-10-CM

## 2016-06-02 ENCOUNTER — Ambulatory Visit: Payer: Self-pay

## 2016-06-05 ENCOUNTER — Ambulatory Visit (INDEPENDENT_AMBULATORY_CARE_PROVIDER_SITE_OTHER): Payer: Medicare Other

## 2016-06-05 VITALS — BP 150/74 | HR 83 | Temp 98.2°F | Ht 65.0 in | Wt 166.0 lb

## 2016-06-05 DIAGNOSIS — E785 Hyperlipidemia, unspecified: Secondary | ICD-10-CM | POA: Diagnosis not present

## 2016-06-05 DIAGNOSIS — Z1231 Encounter for screening mammogram for malignant neoplasm of breast: Secondary | ICD-10-CM

## 2016-06-05 DIAGNOSIS — I10 Essential (primary) hypertension: Secondary | ICD-10-CM | POA: Diagnosis not present

## 2016-06-05 DIAGNOSIS — E559 Vitamin D deficiency, unspecified: Secondary | ICD-10-CM

## 2016-06-05 DIAGNOSIS — M810 Age-related osteoporosis without current pathological fracture: Secondary | ICD-10-CM

## 2016-06-05 DIAGNOSIS — Z0000 Encounter for general adult medical examination without abnormal findings: Secondary | ICD-10-CM

## 2016-06-05 DIAGNOSIS — R7303 Prediabetes: Secondary | ICD-10-CM

## 2016-06-05 DIAGNOSIS — Z1239 Encounter for other screening for malignant neoplasm of breast: Secondary | ICD-10-CM

## 2016-06-05 NOTE — Progress Notes (Signed)
Subjective:   Yolanda Foley is a 76 y.o. female who presents for Medicare Annual (Subsequent) preventive examination.  Review of Systems:  Cardiac Risk Factors include: advanced age (>2men, >88 women);dyslipidemia;hypertension     Objective:     Vitals: BP (!) 150/74   Pulse 83   Temp 98.2 F (36.8 C) (Oral)   Ht 5\' 5"  (1.651 m)   Wt 166 lb (75.3 kg)   SpO2 94%   BMI 27.62 kg/m   Body mass index is 27.62 kg/m.   Tobacco History  Smoking Status  . Never Smoker  Smokeless Tobacco  . Never Used     Counseling given: Not Answered   Past Medical History:  Diagnosis Date  . Cholelithiasis   . Chronic pain   . Depression   . Elevated liver enzymes   . GERD (gastroesophageal reflux disease)   . Hyperlipidemia   . Hypertension   . Nonspecific elevation of levels of transaminase or lactic acid dehydrogenase (LDH)   . Palpitation   . Primary biliary cirrhosis    Past Surgical History:  Procedure Laterality Date  . correction of nasal surgery  35 years ago   . Left ovarian cyst removal  1974  . TONSILLECTOMY  1967    Family History  Problem Relation Age of Onset  . Dementia Mother   . Heart failure Father   . Hypertension Father   . Cancer Brother 60    kidney  . Cancer Maternal Aunt     breast   History  Sexual Activity  . Sexual activity: Not Currently    Outpatient Encounter Prescriptions as of 06/05/2016  Medication Sig  . 5-Hydroxytryptophan (5-HTP) 100 MG CAPS Take 1 capsule by mouth 2 (two) times daily.  Marland Kitchen Apoaequorin (PREVAGEN) 10 MG CAPS Take 1 capsule by mouth daily.  Marland Kitchen aspirin (ASPIRIN LOW DOSE) 81 MG EC tablet Take 81 mg by mouth as needed. Take one tablet by mouth once a day  . Chlorophyll 50 MG CAPS Take 3 capsules by mouth daily.  . Coenzyme Q10-Fish Oil-Vit E (CO-Q 10 OMEGA-3 FISH OIL) CAPS Take 2 capsules by mouth daily.  . Digestive Enzymes (PAPAYA ENZYME) CHEW Chew by mouth as needed.  . Fish Oil-Krill Oil (KRILL OIL PLUS) CAPS Take 1  capsule by mouth daily.  Nyoka Cowden Tea, Camillia sinensis, (GREEN TEA PO) Take by mouth. Patient states that this is a decaf tea and she drinks it 1-2 times daily  . MACA ROOT PO Take 523 mg by mouth.  . Methylsulfonylmethane (MSM) 500 MG CAPS Take 1 capsule by mouth daily.   . Milk Thistle 200 MG CAPS Take 1 capsule by mouth 2 (two) times daily.  . Multiple Vitamin (MULTIVITAMIN) tablet Take 1 tablet by mouth daily. With iron  . olmesartan (BENICAR) 20 MG tablet TAKE ONE TABLET BY MOUTH ONCE DAILY  . ursodiol (ACTIGALL) 300 MG capsule TAKE ONE CAPSULE BY MOUTH THREE TIMES DAILY AFTER A MEAL  . vitamin B-12 (CYANOCOBALAMIN) 1000 MCG tablet Take 1,000 mcg by mouth daily.  . vitamin C (ASCORBIC ACID) 500 MG tablet Take 500 mg by mouth 2 (two) times daily.  Marland Kitchen alendronate (FOSAMAX) 70 MG tablet Take 1 tablet (70 mg total) by mouth every 7 (seven) days. Take with a full glass of water on an empty stomach. (Patient not taking: Reported on 06/05/2016)  . [DISCONTINUED] cyanocobalamin 1000 MCG tablet Take 100 mcg by mouth daily.  . [DISCONTINUED] PAPAYA PO Take by mouth as  needed.    No facility-administered encounter medications on file as of 06/05/2016.     Activities of Daily Living In your present state of health, do you have any difficulty performing the following activities: 06/05/2016  Hearing? Y  Vision? Y  Difficulty concentrating or making decisions? N  Walking or climbing stairs? Y  Dressing or bathing? N  Doing errands, shopping? N  Preparing Food and eating ? N  Using the Toilet? N  In the past six months, have you accidently leaked urine? N  Do you have problems with loss of bowel control? N  Managing your Medications? N  Managing your Finances? N  Housekeeping or managing your Housekeeping? N  Some recent data might be hidden    Patient Care Team: Fayrene Helper, MD as PCP - General Leta Baptist, MD as Attending Physician (Otolaryngology) Rogene Houston, MD as Attending Physician  (Gastroenterology) Leta Baptist, MD as Consulting Physician (Otolaryngology) Rogene Houston, MD as Consulting Physician (Gastroenterology)    Assessment:    Exercise Activities and Dietary recommendations Current Exercise Habits: Structured exercise class, Type of exercise: walking, Time (Minutes): 60, Frequency (Times/Week): 2, Weekly Exercise (Minutes/Week): 120, Intensity: Mild  Goals    . Exercise 3x per week (30 min per time)          Recommend increasing silver sneakers at least 3 times a week, or walking an additional day.      Fall Risk Fall Risk  06/05/2016 05/30/2015 01/10/2015 04/04/2014 11/02/2013  Falls in the past year? No Yes No No No  Number falls in past yr: - 2 or more - - -  Injury with Fall? - Yes - - -   Depression Screen PHQ 2/9 Scores 06/05/2016 01/10/2015 11/02/2013 10/15/2012  PHQ - 2 Score 0 1 0 2  PHQ- 9 Score - 4 - 9     Cognitive Function  Normal   6CIT Screen 06/05/2016  What Year? 0 points  What month? 0 points  What time? 0 points  Count back from 20 0 points  Months in reverse 0 points  Repeat phrase 0 points  Total Score 0    Immunization History  Administered Date(s) Administered  . Influenza Split 02/05/2011  . Influenza Whole 01/21/2006, 05/31/2007, 02/25/2010  . Influenza,inj,Quad PF,36+ Mos 03/15/2013  . Pneumococcal Conjugate-13 05/30/2015  . Pneumococcal Polysaccharide-23 08/16/2008  . Td 08/16/2008   Screening Tests Health Maintenance  Topic Date Due  . ZOSTAVAX  11/08/1998  . INFLUENZA VACCINE  07/16/2016 (Originally 11/27/2015)  . TETANUS/TDAP  08/17/2018  . DEXA SCAN  Completed  . PNA vac Low Risk Adult  Completed      Plan:  I have personally reviewed and addressed the Medicare Annual Wellness questionnaire and have noted the following in the patient's chart:  A. Medical and social history B. Use of alcohol, tobacco or illicit drugs  C. Current medications and supplements D. Functional ability and status E.  Nutritional  status F.  Physical activity G. Advance directives H. List of other physicians I.  Hospitalizations, surgeries, and ER visits in previous 12 months J.  [place] to include cognitive, depression, and falls L. Referrals and appointments - Mammogram and DEXA ordered, patient will call and schedule these.   In addition, I have reviewed and discussed with patient certain preventive protocols, quality metrics, and best practice recommendations. A written personalized care plan for preventive services as well as general preventive health recommendations were provided to patient.  Signed,  Stormy Fabian, LPN Lead Nurse Health Advisor

## 2016-06-05 NOTE — Patient Instructions (Addendum)
Health maintenance: Dur for your Mammogram, bone density and Flu vaccine. I have ordered the Mammogram and bone density, we will call you with these appointment times.   Abnormal screenings: None   Patient concerns: Pain in left arm, recommend follow up with Dr. Moshe Cipro. Continue using blue emu cream until your appointment.   Nurse concerns: Please consider getting your flu vaccine. If you change your mind and want this please contact our office and we can schedule a nurse visit for you to receive this vaccine.   Next PCP appt: In 2-4 weeks for a follow up appointment with Dr. Moshe Cipro. Fasting lab work has been ordered for you, please have this completed about a week prior to your appointment with Dr. Moshe Cipro. Please follow up in 1 year for your annual wellness visit.   Advance directive discussed with patient today. Copy provided for patient to complete at home and have notarized. Patient agrees to have copy sent to our office once it is complete.   Preventive Care 31 Years and Older, Female Preventive care refers to lifestyle choices and visits with your health care provider that can promote health and wellness. What does preventive care include?  A yearly physical exam. This is also called an annual well check.  Dental exams once or twice a year.  Routine eye exams. Ask your health care provider how often you should have your eyes checked.  Personal lifestyle choices, including:  Daily care of your teeth and gums.  Regular physical activity.  Eating a healthy diet.  Avoiding tobacco and drug use.  Limiting alcohol use.  Taking low-dose aspirin every day.  Taking vitamin and mineral supplements as recommended by your health care provider. What happens during an annual well check? The services and screenings done by your health care provider during your annual well check will depend on your age, overall health, lifestyle risk factors, and family history of  disease. Counseling  Your health care provider may ask you questions about your:  Alcohol use.  Tobacco use.  Drug use.  Emotional well-being.  Home and relationship well-being.  Sexual activity.  Eating habits.  History of falls.  Memory and ability to understand (cognition).  Work and work Statistician.  Reproductive health. Screening  You may have the following tests or measurements:  Height, weight, and BMI.  Blood pressure.  Lipid and cholesterol levels. These may be checked every 5 years, or more frequently if you are over 74 years old.  Skin check.  Lung cancer screening. You may have this screening every year starting at age 107 if you have a 30-pack-year history of smoking and currently smoke or have quit within the past 15 years.  Fecal occult blood test (FOBT) of the stool. You may have this test every year starting at age 15.  Flexible sigmoidoscopy or colonoscopy. You may have a sigmoidoscopy every 5 years or a colonoscopy every 10 years starting at age 28.  Hepatitis C blood test.  Diabetes screening. This is done by checking your blood sugar (glucose) after you have not eaten for a while (fasting). You may have this done every 1-3 years.  Bone density scan. This is done to screen for osteoporosis. You may have this done starting at age 55.  Mammogram. This may be done every 1-2 years. Talk to your health care provider about how often you should have regular mammograms. Talk with your health care provider about your test results, treatment options, and if necessary, the need for more  tests. Vaccines  Your health care provider may recommend certain vaccines, such as:  Influenza vaccine. This is recommended every year.  Tetanus, diphtheria, and acellular pertussis (Tdap, Td) vaccine. You may need a Td booster every 10 years.  Zoster vaccine. You may need this after age 12.  Pneumococcal 13-valent conjugate (PCV13) vaccine. One dose is recommended  after age 68.  Pneumococcal polysaccharide (PPSV23) vaccine. One dose is recommended after age 52.  Talk to your health care provider about which screenings and vaccines you need and how often you need them. This information is not intended to replace advice given to you by your health care provider. Make sure you discuss any questions you have with your health care provider. Document Released: 05/11/2015 Document Revised: 01/02/2016 Document Reviewed: 02/13/2015 Elsevier Interactive Patient Education  2017 [employer].

## 2016-06-09 ENCOUNTER — Ambulatory Visit: Payer: Self-pay

## 2016-07-03 ENCOUNTER — Encounter: Payer: Self-pay | Admitting: Family Medicine

## 2016-07-03 ENCOUNTER — Ambulatory Visit (INDEPENDENT_AMBULATORY_CARE_PROVIDER_SITE_OTHER): Payer: Medicare Other | Admitting: Family Medicine

## 2016-07-03 VITALS — BP 132/80 | HR 73 | Resp 15 | Ht 65.0 in | Wt 166.0 lb

## 2016-07-03 DIAGNOSIS — I10 Essential (primary) hypertension: Secondary | ICD-10-CM | POA: Diagnosis not present

## 2016-07-03 DIAGNOSIS — H918X3 Other specified hearing loss, bilateral: Secondary | ICD-10-CM | POA: Diagnosis not present

## 2016-07-03 DIAGNOSIS — R740 Nonspecific elevation of levels of transaminase and lactic acid dehydrogenase [LDH]: Secondary | ICD-10-CM

## 2016-07-03 DIAGNOSIS — R7401 Elevation of levels of liver transaminase levels: Secondary | ICD-10-CM

## 2016-07-03 DIAGNOSIS — G8929 Other chronic pain: Secondary | ICD-10-CM

## 2016-07-03 DIAGNOSIS — E784 Other hyperlipidemia: Secondary | ICD-10-CM | POA: Diagnosis not present

## 2016-07-03 DIAGNOSIS — M25512 Pain in left shoulder: Secondary | ICD-10-CM | POA: Diagnosis not present

## 2016-07-03 DIAGNOSIS — R7303 Prediabetes: Secondary | ICD-10-CM | POA: Diagnosis not present

## 2016-07-03 DIAGNOSIS — R454 Irritability and anger: Secondary | ICD-10-CM

## 2016-07-03 DIAGNOSIS — E7849 Other hyperlipidemia: Secondary | ICD-10-CM

## 2016-07-03 DIAGNOSIS — Z1211 Encounter for screening for malignant neoplasm of colon: Secondary | ICD-10-CM | POA: Diagnosis not present

## 2016-07-03 NOTE — Assessment & Plan Note (Addendum)
Has benefitted in the past from therapy wishes to return, reports an approx 6 month h/o increased irritability, will refer

## 2016-07-03 NOTE — Assessment & Plan Note (Addendum)
3 month history  Of left shoulder pain no specific trauma , marked limitation in mobility with swelling, refer ortho

## 2016-07-03 NOTE — Patient Instructions (Addendum)
F/u in October, call if you need me sooner  Rectal exam today  You are referred for therapy Please get fasting labs as soon as possible  You will be contacted with appiontment dates for your mammogram and bone density tests    You are referred to orthopedic Doc in [facility] re left shoulder pain and reduced mobility with swelling  It is important that you exercise regularly at least 30 minutes 5 times a week. If you develop chest pain, have severe difficulty breathing, or feel very tired, stop exercising immediately and seek medical attention  Thank you  for choosing  Primary Care. We consider it a privelige to serve you.  Delivering excellent health care in a caring and  compassionate way is our goal.  Partnering with you,  so that together we can achieve this goal is our strategy.

## 2016-07-04 ENCOUNTER — Encounter: Payer: Self-pay | Admitting: Family Medicine

## 2016-07-04 DIAGNOSIS — Z1211 Encounter for screening for malignant neoplasm of colon: Secondary | ICD-10-CM | POA: Insufficient documentation

## 2016-07-04 NOTE — Assessment & Plan Note (Signed)
Adequate control, no med change DASH diet and commitment to daily physical activity for a minimum of 30 minutes discussed and encouraged, as a part of hypertension management. The importance of attaining a healthy weight is also discussed.  BP/Weight 07/03/2016 06/05/2016 02/11/2016 08/09/2015 05/30/2015 02/08/2015 2/83/6629  Systolic BP 476 546 503 546 568 127 517  Diastolic BP 80 74 80 60 90 62 80  Wt. (Lbs) 166 166 166.2 171.3 171 172 171.12  BMI 27.62 27.62 27.66 28.06 28.46 28.62 28.48

## 2016-07-04 NOTE — Assessment & Plan Note (Signed)
Recently acquired one hearing aid, states it irritates her ear so does not use at all times, but does find it beneficial

## 2016-07-04 NOTE — Assessment & Plan Note (Signed)
Followed by gI , most recent lab is normal

## 2016-07-04 NOTE — Assessment & Plan Note (Signed)
Patient educated about the importance of limiting  Carbohydrate intake , the need to commit to daily physical activity for a minimum of 30 minutes , and to commit weight loss. The fact that changes in all these areas will reduce or eliminate all together the development of diabetes is stressed.   Diabetic Labs Latest Ref Rng & Units 01/10/2015 08/28/2014 07/11/2013 12/23/2012 09/22/2012  HbA1c <5.7 % 5.9(H) 5.9(H) 5.4 - 5.6  Chol 0 - 200 mg/dL - 185 - 209(H) -  HDL >=46 mg/dL - 57 - 64 -  Calc LDL 0 - 99 mg/dL - 115(H) - 129(H) -  Triglycerides <150 mg/dL - 63 - 82 -  Creatinine 0.60 - 0.93 mg/dL 0.65 0.66 0.67 - 0.68   BP/Weight 07/03/2016 06/05/2016 02/11/2016 08/09/2015 05/30/2015 02/08/2015 0/86/7619  Systolic BP 509 326 712 458 099 833 825  Diastolic BP 80 74 80 60 90 62 80  Wt. (Lbs) 166 166 166.2 171.3 171 172 171.12  BMI 27.62 27.62 27.66 28.06 28.46 28.62 28.48   No flowsheet data found.  Updated lab needed

## 2016-07-04 NOTE — Progress Notes (Signed)
Yolanda Foley     MRN: [id number]      DOB: July 12, 1940   HPI Yolanda Foley is here for follow up and re-evaluation of chronic medical conditions, medication management and review of any available recent lab and radiology data.  Preventive health is updated, specifically  Cancer screening and Immunization.   Questions or concerns regarding consultations or procedures which the PT has had in the interim are  Addressed. Being TREATED BY gi AND NOW HER LIVER ENZYMES ARE NORMAL The PT denies any adverse reactions to current medications since the last visit.  3 month h/o pain , swelling and reduced mobility of left shoulder, radiating to left arm, no direct trauma to the area, pain sometimes extends up to neck  States she notices that she has become irritable and easily irritated in past 3 to 6 months, some may be attributed to chronic pain with poor sleep, also states got "tired of coming to Doctor" and hearing spouse who does not believe in traditional medicine complain about it ROS Denies recent fever or chills. Denies sinus pressure, nasal congestion, ear pain or sore throat. Denies chest congestion, productive cough or wheezing. Denies chest pains, palpitations and leg swelling Denies abdominal pain, nausea, vomiting,diarrhea or constipation.   Denies dysuria, frequency, hesitancy or incontinence. Denies joint pain, swelling and limitation in mobility. Denies headaches, seizures, numbness, or tingling. Denies depression, anxiety or insomnia. Denies skin break down or rash.   PE  BP 132/80   Pulse 73   Resp 15   Ht 5\' 5"  (1.651 m)   Wt 166 lb (75.3 kg)   SpO2 99%   BMI 27.62 kg/m   Patient alert and oriented and in no cardiopulmonary distress.  HEENT: No facial asymmetry, EOMI,   oropharynx pink and moist.  Neck supple no JVD, no mass.  Chest: Clear to auscultation bilaterally.  CVS: S1, S2 no murmurs, no S3.Regular rate.  ABD: Soft non tender. No organomegaly or mass Rectal:  No mass, heme negative stool  Ext: No edema  MS: Adequate ROM spine, , hips and knees.Decreased ROM left shoulder with swelling and tenderness  Skin: Intact, no ulcerations or rash noted.  Psych: Good eye contact, normal affect. Memory intact not anxious or depressed appearing.  CNS: CN 2-12 intact, power,  normal throughout.no focal deficits noted.   Assessment & Plan  Shoulder pain, left 3 month history  Of left shoulder pain no specific trauma , marked limitation in mobility with swelling, refer ortho  Irritable behavior Has benefitted in the past from therapy wishes to return, reports an approx 6 month h/o increased irritability, will refer  Essential hypertension Adequate control, no med change DASH diet and commitment to daily physical activity for a minimum of 30 minutes discussed and encouraged, as a part of hypertension management. The importance of attaining a healthy weight is also discussed.  BP/Weight 07/03/2016 06/05/2016 02/11/2016 08/09/2015 05/30/2015 02/08/2015 2/63/3354  Systolic BP 562 563 893 734 287 681 157  Diastolic BP 80 74 80 60 90 62 80  Wt. (Lbs) 166 166 166.2 171.3 171 172 171.12  BMI 27.62 27.62 27.66 28.06 28.46 28.62 28.48       Transaminasemia Followed by gI , most recent lab is normal  Prediabetes Patient educated about the importance of limiting  Carbohydrate intake , the need to commit to daily physical activity for a minimum of 30 minutes , and to commit weight loss. The fact that changes in all these areas will reduce  or eliminate all together the development of diabetes is stressed.   Diabetic Labs Latest Ref Rng & Units 01/10/2015 08/28/2014 07/11/2013 12/23/2012 09/22/2012  HbA1c <5.7 % 5.9(H) 5.9(H) 5.4 - 5.6  Chol 0 - 200 mg/dL - 185 - 209(H) -  HDL >=46 mg/dL - 57 - 64 -  Calc LDL 0 - 99 mg/dL - 115(H) - 129(H) -  Triglycerides <150 mg/dL - 63 - 82 -  Creatinine 0.60 - 0.93 mg/dL 0.65 0.66 0.67 - 0.68   BP/Weight 07/03/2016 06/05/2016  02/11/2016 08/09/2015 05/30/2015 02/08/2015 7/34/0370  Systolic BP 964 383 818 403 754 360 677  Diastolic BP 80 74 80 60 90 62 80  Wt. (Lbs) 166 166 166.2 171.3 171 172 171.12  BMI 27.62 27.62 27.66 28.06 28.46 28.62 28.48   No flowsheet data found.  Updated lab needed    Hearing loss Recently acquired one hearing aid, states it irritates her ear so does not use at all times, but does find it beneficial  Hyperlipemia Hyperlipidemia:Low fat diet discussed and encouraged.   Lipid Panel  Lab Results  Component Value Date   CHOL 185 08/28/2014   HDL 57 08/28/2014   LDLCALC 115 (H) 08/28/2014   TRIG 63 08/28/2014   CHOLHDL 3.2 08/28/2014    Updated lab needed

## 2016-07-04 NOTE — Assessment & Plan Note (Signed)
Hyperlipidemia:Low fat diet discussed and encouraged.   Lipid Panel  Lab Results  Component Value Date   CHOL 185 08/28/2014   HDL 57 08/28/2014   LDLCALC 115 (H) 08/28/2014   TRIG 63 08/28/2014   CHOLHDL 3.2 08/28/2014    Updated lab needed

## 2016-07-04 NOTE — Assessment & Plan Note (Signed)
Heme negative stool, no mass 

## 2016-07-07 LAB — POC HEMOCCULT BLD/STL (OFFICE/1-CARD/DIAGNOSTIC): Fecal Occult Blood, POC: NEGATIVE

## 2016-07-07 NOTE — Addendum Note (Signed)
Addended by: Eual Fines on: 07/07/2016 09:01 AM   Modules accepted: Orders

## 2016-07-09 ENCOUNTER — Ambulatory Visit (INDEPENDENT_AMBULATORY_CARE_PROVIDER_SITE_OTHER): Payer: Medicare Other

## 2016-07-09 ENCOUNTER — Encounter (INDEPENDENT_AMBULATORY_CARE_PROVIDER_SITE_OTHER): Payer: Self-pay | Admitting: Orthopaedic Surgery

## 2016-07-09 ENCOUNTER — Ambulatory Visit (INDEPENDENT_AMBULATORY_CARE_PROVIDER_SITE_OTHER): Payer: Self-pay

## 2016-07-09 ENCOUNTER — Ambulatory Visit (INDEPENDENT_AMBULATORY_CARE_PROVIDER_SITE_OTHER): Payer: Medicare Other | Admitting: Orthopaedic Surgery

## 2016-07-09 VITALS — BP 133/93 | HR 87 | Resp 16 | Ht 65.0 in | Wt 163.0 lb

## 2016-07-09 DIAGNOSIS — M25512 Pain in left shoulder: Secondary | ICD-10-CM

## 2016-07-09 DIAGNOSIS — M542 Cervicalgia: Secondary | ICD-10-CM | POA: Diagnosis not present

## 2016-07-09 DIAGNOSIS — G8929 Other chronic pain: Secondary | ICD-10-CM

## 2016-07-09 NOTE — Progress Notes (Signed)
Office Visit Note   Patient: Yolanda Foley           Date of Birth: [birth year]           MRN: [id number] Visit Date: 07/09/2016              Requested by: Fayrene Helper, MD [address], [place], [place] [address] PCP: Tula Nakayama, MD   Assessment & Plan: Visit Diagnoses:  1. Neck pain   2. 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 Long discussion regarding the adhesive capsulitis and possible underlying pathology and await results of MRI scan  Follow-Up Instructions: No Follow-up on file.   Orders:  Orders Placed This Encounter  Procedures  . XR Shoulder Left  . XR Cervical Spine 2 or 3 views   No orders of the defined types were placed in this encounter.     Procedures: No procedures performed   Clinical Data: No additional findings.   Subjective: No chief complaint on file.   [name] presents with chronic left shoulder pain that comes and goes. She denies injury, pain now radiates to her neck and down to her hand. She uses ice, elevation and ibuprofen when the pain is bad.  First noted onset of pain approximately 3 months ago. There may be some relationship between the onset of her pain and her trying to pull itself up into her husband's "pickup truck". She never had any evidence of numbness or tingling in her left arm or hand. Never noticed any skin changes or ecchymosis. Tried Advil and blue emu.   Review of Systems   Objective: Vital Signs: There were no vitals taken for this visit.  Physical Exam  Ortho Exam left shoulder with limited range of motion lacking approximately 40 to full overhead flexion. Approximately 80 of abduction. Limited external rotation CONSISTENT with adhesive capsulitis. No pain at the acromioclavicular joint or subacromial region. Biceps intact. Good grip and good release. No swelling distally.    Specialty Comments:  No specialty comments available.  Imaging: No results found.   PMFS History: Patient Active Problem List   Diagnosis Date Noted  . Special screening for malignant neoplasm of colon 07/04/2016  . Irritable behavior 07/03/2016  . Shoulder pain, left 10/08/2014  . 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 Non compliance w medication regimen 07/17/2013  . Onychomycosis 07/17/2013  . Toe trauma 05/18/2013  . Osteoporosis 03/29/2013  . Depression 03/30/2012  . Hearing loss 03/30/2012  . Prediabetes 10/09/2011  . Transaminasemia 07/08/2011  . Insomnia 02/09/2011  . Cholelithiasis 08/05/2010  . ANEMIA 06/22/2009  . FATIGUE 06/22/2009  . ALLERGIC RHINITIS, SEASONAL 03/16/2008  . Essential hypertension 07/19/2007  . GERD 07/19/2007  . Hyperlipemia 04/23/2007   Past Medical History:  Diagnosis Date  . Cholelithiasis   . Chronic pain   . Depression   . Elevated liver enzymes   . GERD (gastroesophageal reflux disease)   . Hyperlipidemia   . Hypertension   . Nonspecific elevation of levels of transaminase or lactic acid dehydrogenase (LDH)   . Palpitation   . Primary biliary cirrhosis     Family History  Problem Relation Age of Onset  . Dementia Mother   . Heart failure Father   . Hypertension Father   . Cancer Brother 51    kidney  . Cancer Maternal Aunt  breast    Past Surgical History:  Procedure Laterality Date  . correction of nasal surgery  35 years ago   . Left ovarian cyst removal  1974  . TONSILLECTOMY  1967    Social History   Occupational History  . retired     Social History Main Topics  . Smoking status: Never Smoker  . Smokeless tobacco: Never Used  . Alcohol use No  . Drug use: No  . Sexual activity: Not Currently

## 2016-07-11 DIAGNOSIS — M19012 Primary osteoarthritis, left shoulder: Secondary | ICD-10-CM | POA: Diagnosis not present

## 2016-07-11 DIAGNOSIS — M75102 Unspecified rotator cuff tear or rupture of left shoulder, not specified as traumatic: Secondary | ICD-10-CM | POA: Diagnosis not present

## 2016-07-11 DIAGNOSIS — S46112A Strain of muscle, fascia and tendon of long head of biceps, left arm, initial encounter: Secondary | ICD-10-CM | POA: Diagnosis not present

## 2016-07-11 DIAGNOSIS — M75112 Incomplete rotator cuff tear or rupture of left shoulder, not specified as traumatic: Secondary | ICD-10-CM | POA: Diagnosis not present

## 2016-07-16 ENCOUNTER — Ambulatory Visit (INDEPENDENT_AMBULATORY_CARE_PROVIDER_SITE_OTHER): Payer: Medicare Other | Admitting: Orthopaedic Surgery

## 2016-07-23 ENCOUNTER — Encounter (INDEPENDENT_AMBULATORY_CARE_PROVIDER_SITE_OTHER): Payer: Self-pay | Admitting: Orthopaedic Surgery

## 2016-07-23 ENCOUNTER — Ambulatory Visit (INDEPENDENT_AMBULATORY_CARE_PROVIDER_SITE_OTHER): Payer: Medicare Other | Admitting: Orthopaedic Surgery

## 2016-07-23 VITALS — Resp 16 | Ht 65.0 in | Wt 163.0 lb

## 2016-07-23 DIAGNOSIS — G8929 Other chronic pain: Secondary | ICD-10-CM

## 2016-07-23 DIAGNOSIS — M25512 Pain in left shoulder: Secondary | ICD-10-CM

## 2016-07-23 NOTE — Progress Notes (Signed)
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 MRI scan demonstrates a tear of the supraspinatus:   Plan: Long discussion regarding findings of the MRI scan and treatment options. Mrs. Yolanda Foley would like to try a course of physical therapy before considering surgery. Not had a cortisone injection and  would like to 'hold on that ""  Follow-Up Instructions: No Follow-up on file.   Orders:  No orders of the defined types were placed in this encounter.  No orders of the defined types were placed in this encounter.     Procedures: No procedures performed   Clinical Data: No additional findings. MRI scan from [facility] demonstrates severe rotator cuff tendinopathy. The full-thickness tear of the anterior supraspinatus measured 1.5 cm from front to back. Retraction is mild at 1-2 cm. The rotator cuff is otherwise intact. No atrophy or focal lesions of any of the muscles. Severe tendinopathy of the intra-articular segment of the biceps the acromioclavicular joint with a type I acromion. Large volume of fluid in in the subacromial and subdeltoid bursa. Superior labrum was severely degenerative but no focal tearing. Glenohumeral joint was unremarkable.  Subjective: No chief complaint on file.   Ms. Below is a 76 year old female here today for MRI of left shoulder.  History of chronic left shoulder pain as outlined in her prior office note. Her symptoms were consistent with a rotator cuff tear and, accordingly ordered an MRI scan. There has been no change in her symptoms since she was here previously.  Review of Systems   Objective: Vital Signs: Resp 16   Ht 5\' 5"  (1.651 m)   Wt 163  lb (73.9 kg)   BMI 27.12 kg/m   Physical Exam  Ortho Exam left shoulder with abduction to approximately 80 and flexion to 110. Internal and external rotation CONSISTENT with adhesive capsulitis. Biceps intact. Weakness with external rotation. Mild pain at the acromioclavicular joint. No crepitation.  Specialty Comments:  No specialty comments available.  Imaging: No results found.   PMFS History: Patient Active Problem List   Diagnosis Date Noted  . Special screening for malignant neoplasm of colon 07/04/2016  . Irritable behavior 07/03/2016  . Shoulder pain, left 10/08/2014  . 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 Non compliance w medication regimen 07/17/2013  . Onychomycosis 07/17/2013  . Toe trauma 05/18/2013  . Osteoporosis 03/29/2013  . Depression 03/30/2012  . Hearing loss 03/30/2012  . Prediabetes 10/09/2011  . Transaminasemia 07/08/2011  . Insomnia 02/09/2011  . Cholelithiasis 08/05/2010  . ANEMIA 06/22/2009  . FATIGUE 06/22/2009  . ALLERGIC RHINITIS, SEASONAL 03/16/2008  . Essential hypertension 07/19/2007  . GERD 07/19/2007  . Hyperlipemia 04/23/2007   Past Medical History:  Diagnosis Date  . Cholelithiasis   . Chronic pain   . Depression   . Elevated liver enzymes   . GERD (gastroesophageal reflux disease)   . Hyperlipidemia   . Hypertension   . Nonspecific elevation of levels of transaminase or lactic acid dehydrogenase (LDH)   . Palpitation   . Primary biliary cirrhosis  Family History  Problem Relation Age of Onset  . Dementia Mother   . Heart failure Father   . Hypertension Father   . Cancer Brother 52    kidney  . Cancer Maternal Aunt     breast    Past Surgical History:  Procedure Laterality Date  . correction of nasal surgery  35 years ago   . Left ovarian cyst removal  1974  . TONSILLECTOMY  1967    Social History   Occupational History  . retired     Social History Main Topics  . Smoking status: Never Smoker    . Smokeless tobacco: Never Used  . Alcohol use No  . Drug use: No  . Sexual activity: Not Currently

## 2016-07-30 DIAGNOSIS — M25612 Stiffness of left shoulder, not elsewhere classified: Secondary | ICD-10-CM | POA: Diagnosis not present

## 2016-07-30 DIAGNOSIS — M25512 Pain in left shoulder: Secondary | ICD-10-CM | POA: Diagnosis not present

## 2016-08-05 DIAGNOSIS — M25612 Stiffness of left shoulder, not elsewhere classified: Secondary | ICD-10-CM | POA: Diagnosis not present

## 2016-08-05 DIAGNOSIS — M25512 Pain in left shoulder: Secondary | ICD-10-CM | POA: Diagnosis not present

## 2016-08-07 DIAGNOSIS — M25512 Pain in left shoulder: Secondary | ICD-10-CM | POA: Diagnosis not present

## 2016-08-07 DIAGNOSIS — M25612 Stiffness of left shoulder, not elsewhere classified: Secondary | ICD-10-CM | POA: Diagnosis not present

## 2016-08-11 ENCOUNTER — Ambulatory Visit (INDEPENDENT_AMBULATORY_CARE_PROVIDER_SITE_OTHER): Payer: Self-pay | Admitting: Internal Medicine

## 2016-08-12 DIAGNOSIS — M25612 Stiffness of left shoulder, not elsewhere classified: Secondary | ICD-10-CM | POA: Diagnosis not present

## 2016-08-12 DIAGNOSIS — M25512 Pain in left shoulder: Secondary | ICD-10-CM | POA: Diagnosis not present

## 2016-08-14 DIAGNOSIS — M25512 Pain in left shoulder: Secondary | ICD-10-CM | POA: Diagnosis not present

## 2016-08-14 DIAGNOSIS — M25612 Stiffness of left shoulder, not elsewhere classified: Secondary | ICD-10-CM | POA: Diagnosis not present

## 2016-08-19 DIAGNOSIS — M25612 Stiffness of left shoulder, not elsewhere classified: Secondary | ICD-10-CM | POA: Diagnosis not present

## 2016-08-19 DIAGNOSIS — M25512 Pain in left shoulder: Secondary | ICD-10-CM | POA: Diagnosis not present

## 2016-08-21 DIAGNOSIS — M25512 Pain in left shoulder: Secondary | ICD-10-CM | POA: Diagnosis not present

## 2016-08-21 DIAGNOSIS — M25612 Stiffness of left shoulder, not elsewhere classified: Secondary | ICD-10-CM | POA: Diagnosis not present

## 2016-08-26 HISTORY — PX: ROTATOR CUFF REPAIR: SHX139

## 2016-08-27 DIAGNOSIS — M25512 Pain in left shoulder: Secondary | ICD-10-CM | POA: Diagnosis not present

## 2016-08-27 DIAGNOSIS — M25612 Stiffness of left shoulder, not elsewhere classified: Secondary | ICD-10-CM | POA: Diagnosis not present

## 2016-08-28 DIAGNOSIS — M25612 Stiffness of left shoulder, not elsewhere classified: Secondary | ICD-10-CM | POA: Diagnosis not present

## 2016-08-28 DIAGNOSIS — M25512 Pain in left shoulder: Secondary | ICD-10-CM | POA: Diagnosis not present

## 2016-09-03 ENCOUNTER — Ambulatory Visit (INDEPENDENT_AMBULATORY_CARE_PROVIDER_SITE_OTHER): Payer: Medicare Other | Admitting: Orthopaedic Surgery

## 2016-09-03 ENCOUNTER — Encounter (INDEPENDENT_AMBULATORY_CARE_PROVIDER_SITE_OTHER): Payer: Self-pay | Admitting: Orthopaedic Surgery

## 2016-09-03 VITALS — BP 120/70 | Ht 65.0 in | Wt 163.0 lb

## 2016-09-03 DIAGNOSIS — M25512 Pain in left shoulder: Secondary | ICD-10-CM

## 2016-09-03 DIAGNOSIS — G8929 Other chronic pain: Secondary | ICD-10-CM

## 2016-09-03 NOTE — Progress Notes (Signed)
Office Visit Note   Patient: Yolanda Foley           Date of Birth: 1941/01/27           MRN: [id number] Visit Date: 09/03/2016              Requested by: Fayrene Helper, [place], [place], [place] [address] PCP: Fayrene Helper, MD   Assessment & Plan: Visit Diagnoses:  1. Chronic left shoulder pain   Rotator cuff tear left shoulder involving supraspinatus with retraction. Also subacromial bursitis and impingement  Plan: Long discussion regarding MRI scan findings and treatment to date. Mrs. Yolanda Foley still has difficulty with overhead activity and loss of flexion. I have suggested arthroscopic subacromial decompression, distal clavicle resection and mini open rotator cuff tear repair. She might need a biceps tenodesis she like to "think about it" she didn't have any questions. This was a closed 30 minutes regarding all the above and in counseling. I'm not sure that therapy is going to be any of any more benefit. She does have a home exercise program. Have discussed the surgery in detail and what she might expect.  Follow-Up Instructions: Return if symptoms worsen or fail to improve.   Orders:  No orders of the defined types were placed in this encounter.  No orders of the defined types were placed in this encounter.     Procedures: No procedures performed   Clinical Data: No additional findings.   Subjective: Chief Complaint  Patient presents with  . Left Shoulder - Pain, Results    [name] is a 76 y o that presents with chronic left shoulder pain that has RCR tear through MRI results. She relates she has limited ROM and is in pain.   Mrs. Yolanda Foley has been receiving physical therapy at [facility] and relates she might be "a little bit better. She still having difficulty with overhead motion and sharp pain with certain activities particularly with the arm in an overhead position. She denies any numbness or tingling  HPI  Review of  Systems   Objective: Vital Signs: BP 120/70   Ht 5\' 5"  (1.651 m)   Wt 163 lb (73.9 kg)   BMI 27.12 kg/m   Physical Exam  Ortho Exam left shoulder exam with lack of about 40 to full flexion consistent with adhesive capsulitis. Abduction 90. Some loss of internal rotation. Mild weakness with external rotation. None with internal rotation. Mild pain and some prominence at the acromioclavicular joint. Positive impingement. Positive empty can testing. Biceps intact. Skin intact neurovascular exam intact. No pain with range of motion of cervical spine.  Specialty Comments:  No specialty comments available.  Imaging: No results found.   PMFS History: Patient Active Problem List   Diagnosis Date Noted  . Special screening for malignant neoplasm of colon 07/04/2016  . Irritable behavior 07/03/2016  . Shoulder pain, left 10/08/2014  . 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 Non compliance w medication regimen 07/17/2013  . Onychomycosis 07/17/2013  . Toe trauma 05/18/2013  . Osteoporosis 03/29/2013  . Depression 03/30/2012  . Hearing loss 03/30/2012  . Prediabetes 10/09/2011  . Transaminasemia 07/08/2011  . Insomnia 02/09/2011  . Cholelithiasis 08/05/2010  . ANEMIA 06/22/2009  . FATIGUE 06/22/2009  . ALLERGIC RHINITIS, SEASONAL 03/16/2008  . Essential hypertension 07/19/2007  . GERD 07/19/2007  . Hyperlipemia 04/23/2007   Past Medical History:  Diagnosis Date  . Cholelithiasis   . Chronic pain   .  Depression   . Elevated liver enzymes   . GERD (gastroesophageal reflux disease)   . Hyperlipidemia   . Hypertension   . Nonspecific elevation of levels of transaminase or lactic acid dehydrogenase (LDH)   . Palpitation   . Primary biliary cirrhosis (HCC)     Family History  Problem Relation Age of Onset  . Dementia Mother   . Heart failure Father   . Hypertension Father   . Cancer Brother 38    kidney  . Cancer Maternal Aunt     breast    Past Surgical  History:  Procedure Laterality Date  . correction of nasal surgery  35 years ago   . Left ovarian cyst removal  1974  . TONSILLECTOMY  1967    Social History   Occupational History  . retired     Social History Main Topics  . Smoking status: Never Smoker  . Smokeless tobacco: Never Used  . Alcohol use No  . Drug use: No  . Sexual activity: Not Currently     Garald Balding, MD   Note - This record has been created using [employer].  Chart creation errors have been sought, but may not always  have been located. Such creation errors do not reflect on  the standard of medical care.

## 2016-09-17 ENCOUNTER — Other Ambulatory Visit: Payer: Self-pay | Admitting: Family Medicine

## 2016-09-17 DIAGNOSIS — E559 Vitamin D deficiency, unspecified: Secondary | ICD-10-CM | POA: Diagnosis not present

## 2016-09-17 DIAGNOSIS — E785 Hyperlipidemia, unspecified: Secondary | ICD-10-CM | POA: Diagnosis not present

## 2016-09-17 DIAGNOSIS — I10 Essential (primary) hypertension: Secondary | ICD-10-CM | POA: Diagnosis not present

## 2016-09-17 DIAGNOSIS — R7303 Prediabetes: Secondary | ICD-10-CM | POA: Diagnosis not present

## 2016-09-18 ENCOUNTER — Telehealth: Payer: Self-pay | Admitting: Family Medicine

## 2016-09-18 LAB — LIPID PANEL
CHOL/HDL RATIO: 2.9 ratio (ref <5.0)
CHOLESTEROL: 182 mg/dL (ref <200)
HDL: 63 mg/dL (ref >50)
LDL Cholesterol: 105 mg/dL — ABNORMAL HIGH (ref <100)
TRIGLYCERIDES: 70 mg/dL (ref <150)
VLDL: 14 mg/dL (ref <30)

## 2016-09-18 LAB — BASIC METABOLIC PANEL
BUN: 10 mg/dL (ref 7–25)
CHLORIDE: 104 mmol/L (ref 98–110)
CO2: 25 mmol/L (ref 20–31)
Calcium: 9.2 mg/dL (ref 8.6–10.4)
Creat: 0.77 mg/dL (ref 0.60–0.93)
GLUCOSE: 84 mg/dL (ref 65–99)
POTASSIUM: 4 mmol/L (ref 3.5–5.3)
Sodium: 139 mmol/L (ref 135–146)

## 2016-09-18 LAB — TSH: TSH: 0.98 mIU/L

## 2016-09-18 LAB — VITAMIN D 25 HYDROXY (VIT D DEFICIENCY, FRACTURES): Vit D, 25-Hydroxy: 32 ng/mL (ref 30–100)

## 2016-09-18 LAB — HEMOGLOBIN A1C
Hgb A1c MFr Bld: 5.5 % (ref <5.7)
Mean Plasma Glucose: 111 mg/dL

## 2016-09-18 NOTE — Telephone Encounter (Signed)
I attempted to cll pt at 5:46 on her cell did not leave a message as after hours. Will call in am, need to attemtpt to add on CBC

## 2016-09-19 ENCOUNTER — Telehealth: Payer: Self-pay | Admitting: Family Medicine

## 2016-09-19 ENCOUNTER — Telehealth: Payer: Self-pay

## 2016-09-19 LAB — CBC
HCT: 35.2 % (ref 35.0–45.0)
HEMOGLOBIN: 11.5 g/dL — AB (ref 11.7–15.5)
MCH: 30.4 pg (ref 27.0–33.0)
MCHC: 32.7 g/dL (ref 32.0–36.0)
MCV: 93.1 fL (ref 80.0–100.0)
MPV: 13.2 fL — ABNORMAL HIGH (ref 7.5–12.5)
Platelets: 140 10*3/uL (ref 140–400)
RBC: 3.78 MIL/uL — AB (ref 3.80–5.10)
RDW: 14.6 % (ref 11.0–15.0)
WBC: 4.6 10*3/uL (ref 3.8–10.8)

## 2016-09-19 NOTE — Telephone Encounter (Signed)
Yolanda Foley returning call to office.  cb # [phone]

## 2016-09-19 NOTE — Telephone Encounter (Signed)
Pt has not had pre op visit as yet , please schedule an appt  ??? Next week Tuesday for preop assessment IF posssible!!!, you will need to call her with date and time, let the surgical  Center know this has not yet been done, so far all she has had is lab work and not even all that she would need, no pre op request was sent form the center and we are bressed for slots next week

## 2016-09-19 NOTE — Telephone Encounter (Signed)
Yolanda Foley  with [facility] requesting last ov notes, ekg, current labs, & last wellness visit notes (for continuation of care) to be faxed to [phone].  Patient is scheduled for rotator cuff repair of the Left shoulder with Dr. Durward Fortes on Sep 25, 2016 at [facility].  Ok to fax today?

## 2016-09-19 NOTE — Telephone Encounter (Signed)
Information faxed

## 2016-09-19 NOTE — Telephone Encounter (Signed)
No assessment from Dr. Moshe Cipro needed.  EKG will be done by [facility]. Lavella Lemons only needs ov notes and current labs.

## 2016-09-25 ENCOUNTER — Encounter: Payer: Self-pay | Admitting: Orthopaedic Surgery

## 2016-09-25 DIAGNOSIS — M19012 Primary osteoarthritis, left shoulder: Secondary | ICD-10-CM | POA: Diagnosis not present

## 2016-09-25 DIAGNOSIS — G8918 Other acute postprocedural pain: Secondary | ICD-10-CM | POA: Diagnosis not present

## 2016-09-25 DIAGNOSIS — M75122 Complete rotator cuff tear or rupture of left shoulder, not specified as traumatic: Secondary | ICD-10-CM | POA: Diagnosis not present

## 2016-09-25 DIAGNOSIS — M7522 Bicipital tendinitis, left shoulder: Secondary | ICD-10-CM | POA: Diagnosis not present

## 2016-09-25 DIAGNOSIS — M7542 Impingement syndrome of left shoulder: Secondary | ICD-10-CM | POA: Diagnosis not present

## 2016-09-25 DIAGNOSIS — M75102 Unspecified rotator cuff tear or rupture of left shoulder, not specified as traumatic: Secondary | ICD-10-CM | POA: Diagnosis not present

## 2016-09-25 NOTE — Telephone Encounter (Signed)
Pt's surgery has been sorted out

## 2016-09-30 ENCOUNTER — Ambulatory Visit: Payer: Self-pay | Admitting: Family Medicine

## 2016-10-01 ENCOUNTER — Encounter (INDEPENDENT_AMBULATORY_CARE_PROVIDER_SITE_OTHER): Payer: Self-pay | Admitting: Orthopaedic Surgery

## 2016-10-01 ENCOUNTER — Ambulatory Visit (INDEPENDENT_AMBULATORY_CARE_PROVIDER_SITE_OTHER): Payer: Medicare Other | Admitting: Orthopaedic Surgery

## 2016-10-01 VITALS — BP 98/63 | HR 86 | Ht 65.0 in | Wt 163.0 lb

## 2016-10-01 DIAGNOSIS — G8929 Other chronic pain: Secondary | ICD-10-CM

## 2016-10-01 DIAGNOSIS — M25512 Pain in left shoulder: Secondary | ICD-10-CM

## 2016-10-01 NOTE — Progress Notes (Signed)
   Post-Op Visit Note   Patient: Yolanda Foley           Date of Birth: 1940-10-16           MRN: [id number] Visit Date: 10/01/2016 PCP: Yolanda Helper, MD   Assessment & Plan:  Chief Complaint:  Chief Complaint  Patient presents with  . Left Shoulder - Edema, Pain    Yolanda Foley is a 76 y o that is 6 days status post Left shoulder arthroscopy. She relates she is sleepy and having pain today.  Surgery include an arthroscopic subacromial decompression, distal clavicle resection, mini open rotator cuff tear repair and a biceps tenotomy Visit Diagnoses:  1. Chronic left shoulder pain    Doing well postoperatively with minimal discomfort. Neurovascular exam intact. Plan: Continue sling, replace Steri-Strips, begin physical therapy, office 2 weeks  Follow-Up Instructions: Return in about 2 weeks (around 10/15/2016).   Orders:  No orders of the defined types were placed in this encounter.  No orders of the defined types were placed in this encounter.   Imaging: No results found.  PMFS History: Patient Active Problem List   Diagnosis Date Noted  . Special screening for malignant neoplasm of colon 07/04/2016  . Irritable behavior 07/03/2016  . Shoulder pain, left 10/08/2014  . 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 Non compliance w medication regimen 07/17/2013  . Onychomycosis 07/17/2013  . Toe trauma 05/18/2013  . Osteoporosis 03/29/2013  . Depression 03/30/2012  . Hearing loss 03/30/2012  . Prediabetes 10/09/2011  . Transaminasemia 07/08/2011  . Insomnia 02/09/2011  . Cholelithiasis 08/05/2010  . ANEMIA 06/22/2009  . FATIGUE 06/22/2009  . ALLERGIC RHINITIS, SEASONAL 03/16/2008  . Essential hypertension 07/19/2007  . GERD 07/19/2007  . Hyperlipemia 04/23/2007   Past Medical History:  Diagnosis Date  . Cholelithiasis   . Chronic pain   . Depression   . Elevated liver enzymes   . GERD (gastroesophageal reflux disease)   . Hyperlipidemia   . Hypertension    . Nonspecific elevation of levels of transaminase or lactic acid dehydrogenase (LDH)   . Palpitation   . Primary biliary cirrhosis (HCC)     Family History  Problem Relation Age of Onset  . Dementia Mother   . Heart failure Father   . Hypertension Father   . Cancer Brother 72       kidney  . Cancer Maternal Aunt        breast    Past Surgical History:  Procedure Laterality Date  . correction of nasal surgery  35 years ago   . Left ovarian cyst removal  1974  . TONSILLECTOMY  1967    Social History   Occupational History  . retired     Social History Main Topics  . Smoking status: Never Smoker  . Smokeless tobacco: Never Used  . Alcohol use No  . Drug use: No  . Sexual activity: Not Currently

## 2016-10-03 ENCOUNTER — Telehealth (HOSPITAL_COMMUNITY): Payer: Self-pay | Admitting: *Deleted

## 2016-10-03 NOTE — Telephone Encounter (Signed)
left voice message regarding appointment. 

## 2016-10-10 ENCOUNTER — Telehealth: Payer: Self-pay | Admitting: *Deleted

## 2016-10-10 NOTE — Telephone Encounter (Signed)
Patient called left message stating she needs her iron pills refilled, patient said she had surgery on 5/31 and has been unable to move. Please advise [phone]

## 2016-10-13 NOTE — Telephone Encounter (Signed)
Her hemoglobin is better than usual,  Can just stay on her multivitamin with iron

## 2016-10-13 NOTE — Telephone Encounter (Signed)
Patient aware.

## 2016-10-13 NOTE — Telephone Encounter (Signed)
Wanted to know what brand was recommended and Dr Meda Coffee advised centrum silver and pt was not happy with that so I then advised her to consult with the pharmacist about the brands with more iron content. She agrees

## 2016-10-13 NOTE — Telephone Encounter (Signed)
States she feels so drained after her surgery and wants to know how much iron to take based on her labs. If needed she wants it called in as a prescription.

## 2016-10-15 ENCOUNTER — Encounter (INDEPENDENT_AMBULATORY_CARE_PROVIDER_SITE_OTHER): Payer: Self-pay | Admitting: Orthopaedic Surgery

## 2016-10-15 ENCOUNTER — Ambulatory Visit (INDEPENDENT_AMBULATORY_CARE_PROVIDER_SITE_OTHER): Payer: Medicare Other | Admitting: Orthopaedic Surgery

## 2016-10-15 VITALS — Ht 65.0 in | Wt 163.0 lb

## 2016-10-15 DIAGNOSIS — M25512 Pain in left shoulder: Secondary | ICD-10-CM

## 2016-10-15 DIAGNOSIS — G8929 Other chronic pain: Secondary | ICD-10-CM

## 2016-10-15 NOTE — Progress Notes (Signed)
   Post-Op Visit Note   Patient: Yolanda Foley           Date of Birth: June 27, 1940           MRN: [id number] Visit Date: 10/15/2016 PCP: Yolanda Helper, MD   Assessment & Plan: 3 weeks status post left shoulder arthroscopic subacromial decompression, distal clavicle resection, rotator cuff tear repair and biceps tenodesis. Has not been to physical therapy and has developed an early adhesive capsulitis. We will reschedule physical therapy for her implant check her back in 3 weeks. Also discussed use of NSAIDs rather than analgesics Chief Complaint:  Chief Complaint  Patient presents with  . Left Shoulder - Routine Post Op    Yolanda Foley is status post 3 weeks L shoulder arthroscopy. She relates she may be anemic and has spoken with her PCP.    Visit Diagnoses:  1. Chronic left shoulder pain   3 weeks postop will without complications other than early adhesive capsulitis. Wounds healing nicely. Good grip and good release. Neurovascular exam intact  Plan: Office 3 weeks, use ice or heat and begin physical therapy. She's had several schedule physical therapy visits and has canceled each. She now is developed in early adhesive capsulitis which in itself will  Create a  fair amount of pain  Follow-Up Instructions: Return in about 3 weeks (around 11/05/2016).   Orders:  No orders of the defined types were placed in this encounter.  No orders of the defined types were placed in this encounter.   Imaging: No results found.  PMFS History: Patient Active Problem List   Diagnosis Date Noted  . Special screening for malignant neoplasm of colon 07/04/2016  . Irritable behavior 07/03/2016  . Shoulder pain, left 10/08/2014  . 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 Non compliance w medication regimen 07/17/2013  . Onychomycosis 07/17/2013  . Toe trauma 05/18/2013  . Osteoporosis 03/29/2013  . Depression 03/30/2012  . Hearing loss 03/30/2012  . Prediabetes 10/09/2011  .  Transaminasemia 07/08/2011  . Insomnia 02/09/2011  . Cholelithiasis 08/05/2010  . ANEMIA 06/22/2009  . FATIGUE 06/22/2009  . ALLERGIC RHINITIS, SEASONAL 03/16/2008  . Essential hypertension 07/19/2007  . GERD 07/19/2007  . Hyperlipemia 04/23/2007   Past Medical History:  Diagnosis Date  . Cholelithiasis   . Chronic pain   . Depression   . Elevated liver enzymes   . GERD (gastroesophageal reflux disease)   . Hyperlipidemia   . Hypertension   . Nonspecific elevation of levels of transaminase or lactic acid dehydrogenase (LDH)   . Palpitation   . Primary biliary cirrhosis (HCC)     Family History  Problem Relation Age of Onset  . Dementia Mother   . Heart failure Father   . Hypertension Father   . Cancer Brother 18       kidney  . Cancer Maternal Aunt        breast    Past Surgical History:  Procedure Laterality Date  . correction of nasal surgery  35 years ago   . Left ovarian cyst removal  1974  . TONSILLECTOMY  1967    Social History   Occupational History  . retired     Social History Main Topics  . Smoking status: Never Smoker  . Smokeless tobacco: Never Used  . Alcohol use No  . Drug use: No  . Sexual activity: Not Currently

## 2016-10-21 ENCOUNTER — Telehealth (HOSPITAL_COMMUNITY): Payer: Self-pay | Admitting: *Deleted

## 2016-10-21 NOTE — Telephone Encounter (Signed)
ERROR

## 2016-10-22 DIAGNOSIS — M25412 Effusion, left shoulder: Secondary | ICD-10-CM | POA: Diagnosis not present

## 2016-10-22 DIAGNOSIS — M25612 Stiffness of left shoulder, not elsewhere classified: Secondary | ICD-10-CM | POA: Diagnosis not present

## 2016-10-22 DIAGNOSIS — M25512 Pain in left shoulder: Secondary | ICD-10-CM | POA: Diagnosis not present

## 2016-10-28 DIAGNOSIS — M25412 Effusion, left shoulder: Secondary | ICD-10-CM | POA: Diagnosis not present

## 2016-10-28 DIAGNOSIS — M25612 Stiffness of left shoulder, not elsewhere classified: Secondary | ICD-10-CM | POA: Diagnosis not present

## 2016-10-28 DIAGNOSIS — M25512 Pain in left shoulder: Secondary | ICD-10-CM | POA: Diagnosis not present

## 2016-10-30 DIAGNOSIS — M25612 Stiffness of left shoulder, not elsewhere classified: Secondary | ICD-10-CM | POA: Diagnosis not present

## 2016-10-30 DIAGNOSIS — M25512 Pain in left shoulder: Secondary | ICD-10-CM | POA: Diagnosis not present

## 2016-10-30 DIAGNOSIS — M25412 Effusion, left shoulder: Secondary | ICD-10-CM | POA: Diagnosis not present

## 2016-11-05 ENCOUNTER — Ambulatory Visit (INDEPENDENT_AMBULATORY_CARE_PROVIDER_SITE_OTHER): Payer: Medicare Other | Admitting: Orthopaedic Surgery

## 2016-11-05 ENCOUNTER — Encounter (INDEPENDENT_AMBULATORY_CARE_PROVIDER_SITE_OTHER): Payer: Self-pay | Admitting: Orthopaedic Surgery

## 2016-11-05 VITALS — Ht 66.0 in | Wt 150.0 lb

## 2016-11-05 DIAGNOSIS — M25512 Pain in left shoulder: Secondary | ICD-10-CM

## 2016-11-05 DIAGNOSIS — G8929 Other chronic pain: Secondary | ICD-10-CM

## 2016-11-05 NOTE — Progress Notes (Signed)
   Post-Op Visit Note   Patient: Yolanda Foley           Date of Birth: [birth year]           MRN: [id number] Visit Date: 11/05/2016 PCP: Fayrene Helper, MD   Assessment & Plan:  Chief Complaint:  Chief Complaint  Patient presents with  . Left Shoulder - Routine Post Op    Ms. Tugwell is 6 weeks status post Left shoulder arthroscopy.   Visit Diagnoses:  1. Chronic left shoulder pain   6 weeks status post arthroscopic subacromial decompression, distal clavicle resection rotator cuff tear repair and biceps tenodesis. She's doing quite well and happy with her progress  Plan: Follow-up 1 month. Discontinue sling. Continue exercises. Mild adhesive capsulitis that hopefully just resolve with physical therapy. Majority of her preoperative symptoms have resolved. Excellent progress to date Follow-Up Instructions: Return in about 1 month (around 12/06/2016).   Orders:  No orders of the defined types were placed in this encounter.  No orders of the defined types were placed in this encounter.   Imaging: No results found.  PMFS History: Patient Active Problem List   Diagnosis Date Noted  . Special screening for malignant neoplasm of colon 07/04/2016  . Irritable behavior 07/03/2016  . Shoulder pain, left 10/08/2014  . 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 Non compliance w medication regimen 07/17/2013  . Onychomycosis 07/17/2013  . Toe trauma 05/18/2013  . Osteoporosis 03/29/2013  . Depression 03/30/2012  . Hearing loss 03/30/2012  . Prediabetes 10/09/2011  . Transaminasemia 07/08/2011  . Insomnia 02/09/2011  . Cholelithiasis 08/05/2010  . ANEMIA 06/22/2009  . FATIGUE 06/22/2009  . ALLERGIC RHINITIS, SEASONAL 03/16/2008  . Essential hypertension 07/19/2007  . GERD 07/19/2007  . Hyperlipemia 04/23/2007   Past Medical History:  Diagnosis Date  . Cholelithiasis   . Chronic pain   . Depression   . Elevated liver enzymes   . GERD (gastroesophageal reflux disease)    . Hyperlipidemia   . Hypertension   . Nonspecific elevation of levels of transaminase or lactic acid dehydrogenase (LDH)   . Palpitation   . Primary biliary cirrhosis (HCC)     Family History  Problem Relation Age of Onset  . Dementia Mother   . Heart failure Father   . Hypertension Father   . Cancer Brother 41       kidney  . Cancer Maternal Aunt        breast    Past Surgical History:  Procedure Laterality Date  . correction of nasal surgery  35 years ago   . Left ovarian cyst removal  1974  . TONSILLECTOMY  1967    Social History   Occupational History  . retired     Social History Main Topics  . Smoking status: Never Smoker  . Smokeless tobacco: Never Used  . Alcohol use No  . Drug use: No  . Sexual activity: Not Currently

## 2016-11-10 ENCOUNTER — Encounter (HOSPITAL_COMMUNITY): Payer: Self-pay | Admitting: Psychiatry

## 2016-11-10 ENCOUNTER — Ambulatory Visit (INDEPENDENT_AMBULATORY_CARE_PROVIDER_SITE_OTHER): Payer: Medicare Other | Admitting: Psychiatry

## 2016-11-10 DIAGNOSIS — F329 Major depressive disorder, single episode, unspecified: Secondary | ICD-10-CM

## 2016-11-10 DIAGNOSIS — F32A Depression, unspecified: Secondary | ICD-10-CM

## 2016-11-12 DIAGNOSIS — M25412 Effusion, left shoulder: Secondary | ICD-10-CM | POA: Diagnosis not present

## 2016-11-12 DIAGNOSIS — M25612 Stiffness of left shoulder, not elsewhere classified: Secondary | ICD-10-CM | POA: Diagnosis not present

## 2016-11-12 DIAGNOSIS — M25512 Pain in left shoulder: Secondary | ICD-10-CM | POA: Diagnosis not present

## 2016-11-12 NOTE — Progress Notes (Signed)
Comprehensive Clinical Assessment (CCA) Note  11/12/2016 Yolanda Foley [id number]  Visit Diagnosis:      ICD-10-CM   1. Depressive disorder F32.9       CCA Part One  Part One has been completed on paper by the patient.  (See scanned document in Chart Review)  CCA Part Two A  Intake/Chief Complaint:  CCA Intake With Chief Complaint CCA Part Two Date: 11/10/16 CCA Part Two Time: 8127 Chief Complaint/Presenting Problem: It seems as though I am over sensitive when things don't go my way. I find myself being irritable. My husband and I lived in [place] for 40 years. I kept things together while my husband worked. I worked 2 jobs and went to school at the time. During that time, my husband didn't seem to appreciate it. He would only comment when something was wrong. Over the years, the situation has become overbearing.  I also have stress related to my church.  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 I felt unappreciated.  Patients Currently Reported Symptoms/Problems: depressed mood, irritability, poor sleep patterns, fatigue, poor concentration, excessive worrying, loss of interest in activities,  Individual's Abilities: have the ability to let it be, let it not bother me, not to feel like I have to talk about a situation  Initial Clinical Notes/Concerns: Patient presents with history of symptoms of depressions that began in 2001 when patient moved back to [place] from [place] to take care of my mother who has dementia. She reports no psychiatric hospitalization. Patient  participated in outpatient therapy at [facility] for about a year about 3 months ago.  Patient reports taking an antidepressant as prescribed by PCP for a brief period but it made her sleepy.   Mental Health Symptoms Depression:  Depression: Change in energy/activity, Difficulty Concentrating, Fatigue, Increase/decrease in appetite, Irritability, Sleep (too much or little),  Weight gain/loss  Mania:  Mania: N/A  Anxiety:   Anxiety: Difficulty concentrating, Fatigue, Irritability, Tension, Worrying  Psychosis:  Psychosis: N/A  Trauma:  Trauma: N/A  Obsessions:  Obsessions: N/A  Compulsions:  Compulsions: N/A  Inattention:  Inattention: N/A  Hyperactivity/Impulsivity:  Hyperactivity/Impulsivity: N/A  Oppositional/Defiant Behaviors:  Oppositional/Defiant Behaviors: N/A  Borderline Personality:  Emotional Irregularity: N/A  Other Mood/Personality Symptoms:      Mental Status Exam Appearance and self-care  Stature:  Stature: Average  Weight:  Weight: Overweight  Clothing:  Clothing: Neat/clean  Grooming:  Grooming: Normal  Cosmetic use:  Cosmetic Use: None  Posture/gait:  Posture/Gait: Normal  Motor activity:  Motor Activity: Not Remarkable  Sensorium  Attention:  Attention: Distractible  Concentration:  Concentration: Normal  Orientation:  Orientation: Object, Person, Place, Situation, Time  Recall/memory:  Recall/Memory: Normal  Affect and Mood  Affect:  Affect: Depressed  Mood:  Mood: Depressed  Relating  Eye contact:  Eye Contact: Normal  Facial expression:  Facial Expression: Responsive  Attitude toward examiner:  Attitude Toward Examiner: Cooperative  Thought and Language  Speech flow: Speech Flow: Normal  Thought content:  Thought Content: Appropriate to mood and circumstances  Preoccupation:  Preoccupations: Ruminations  Hallucinations:  Hallucinations:  (None)  Organization:  Logical   Transport planner of Knowledge:  Fund of Knowledge: Average  Intelligence:  Intelligence: Average  Abstraction:  Abstraction: Normal  Judgement:  Judgement: Normal  Reality Testing:  Reality Testing: Realistic  Insight:  Insight: Flashes of insight  Decision Making:  Decision Making:  Normal  Social Functioning  Social Maturity:  Social Maturity: Isolates  Social Judgement:  Social Judgement: Normal  Stress  Stressors:  Stressors: Family  conflict  Coping Ability:  Coping Ability: Research officer, political party Deficits:    Supports:     Family and Psychosocial History: Family history Marital status: Married Number of Years Married: 93 What types of issues is patient dealing with in the relationship?: Patient reports feeling unappreciated, feels put last Additional relationship information: Patient and her husband reside in [place] Are you sexually active?: No What is your sexual orientation?: heterosexual Has your sexual activity been affected by drugs, alcohol, medication, or emotional stress?: no Does patient have children?: Yes How many children?: 4 How is patient's relationship with their children?: loving relationship  with children, one son and three daughters, all reside in [place]  Childhood History:  Childhood History By whom was/is the patient raised?: Both parents Additional childhood history information: Patient was born and raised in [place]  Description of patient's relationship with caregiver when they were a child: special relationship with parents, very loving relationship Patient's description of current relationship with people who raised him/her: deceased How were you disciplined when you got in trouble as a child/adolescent?: talked to  Does patient have siblings?: Yes Number of Siblings: 3 Description of patient's current relationship with siblings: one is deceased, good relationship with remaining 2 siblings Did patient suffer any verbal/emotional/physical/sexual abuse as a child?: No Did patient suffer from severe childhood neglect?: No Has patient ever been sexually abused/assaulted/raped as an adolescent or adult?: No Was the patient ever a victim of a crime or a disaster?: No Witnessed domestic violence?: No Has patient been effected by domestic violence as an adult?: No  CCA Part Two B  Employment/Work Situation: Employment / Work Copywriter, 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  patient has a held a job?: 18 years Where was the patient employed at that time?: [employer]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, became supervisor Has patient ever been in the [employer]?: No Are There Guns or Other Weapons in [place]?: No  Education: Education Did Teacher, adult education From [employer]?: Yes Did Physicist, medical?: Yes What Type of College Degree Do you Have?: Equivalent of Bachelor's Degree, Business Administratiion at [employer] Did You Have Any Chief Technology Officer In [employer]?: chorus, drama club Did You Have An Individualized Education Program (IIEP): No Did You Have Any Difficulty At [employer]?: No  Religion: Religion/Spirituality Are You A Religious Person?: Yes What is Your Religious Affiliation?: Baptist How Might This Affect Treatment?: No effect  Leisure/Recreation: Leisure / Recreation Leisure and Hobbies: go to silver sneakers, exercising, gardening, make floral arrangements  Exercise/Diet: Exercise/Diet Do You Exercise?: Yes What Type of Exercise Do You Do?: Other (Comment) (Silver Sneakers) How Many Times a Week Do You Exercise?: 1-3 times a week Have You Gained or Lost A Significant Amount of Weight in the Past Six Months?: Yes-Lost Number of Pounds Lost?: 10 (in the past 2 months) Do You Follow a Special Diet?: No Do You Have Any Trouble Sleeping?: Yes Explanation of Sleeping Difficulties: Patient reports feeling restless at night  CCA Part Two C  Alcohol/Drug Use: Alcohol / Drug Use Pain Medications: See patient's medical record Prescriptions: See patient's medical record Over the Counter: See patient's medical record History of alcohol / drug use?: No history of alcohol / drug abuse    CCA Part Three  ASAM's:  Six Dimensions of Multidimensional Assessment  N/A  Substance  use Disorder (SUD) N/A    Social Function:  Social Functioning Social Maturity: Isolates Social Judgement: Normal  Stress:  Stress Stressors: Family  conflict Coping Ability: Exhausted Patient Takes Medications The Way The Doctor Instructed?: Yes Priority Risk: Low Acuity  Risk Assessment- Self-Harm Potential: Risk Assessment For Self-Harm Potential Thoughts of Self-Harm: No current thoughts Method: No plan Availability of Means: No access/NA  Risk Assessment -Dangerous to Others Potential: Risk Assessment For Dangerous to Others Potential Method: No Plan Availability of Means: No access or NA Intent: Vague intent or NA Notification Required: No need or identified person  DSM5 Diagnoses: Patient Active Problem List   Diagnosis Date Noted  . Special screening for malignant neoplasm of colon 07/04/2016  . Irritable behavior 07/03/2016  . Shoulder pain, left 10/08/2014  . 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 Non compliance w medication regimen 07/17/2013  . Onychomycosis 07/17/2013  . Toe trauma 05/18/2013  . Osteoporosis 03/29/2013  . Depression 03/30/2012  . Hearing loss 03/30/2012  . Prediabetes 10/09/2011  . Transaminasemia 07/08/2011  . Insomnia 02/09/2011  . Cholelithiasis 08/05/2010  . ANEMIA 06/22/2009  . FATIGUE 06/22/2009  . ALLERGIC RHINITIS, SEASONAL 03/16/2008  . Essential hypertension 07/19/2007  . GERD 07/19/2007  . Hyperlipemia 04/23/2007    Patient Centered Plan: Patient is on the following Treatment Plan(s):    Recommendations for Services/Supports/Treatments: Recommendations for Services/Supports/Treatments Recommendations For Services/Supports/Treatments: Individual Therapy  the patient attends the assessment appointment today. Confidentiality and limits are discussed. The patient agrees to return for an appointment in 2 weeks for continuing assessment and treatment planning. She agrees to call this practice, call 911, or have someone take her to the emergency room should symptoms worsen. Individual therapy is recommended every 2 weeks to improve coping skills.  Treatment Plan Summary:     Referrals to Alternative Service(s): Referred to Alternative Service(s):   Place:   Date:   Time:    Referred to Alternative Service(s):   Place:   Date:   Time:    Referred to Alternative Service(s):   Place:   Date:   Time:    Referred to Alternative Service(s):   Place:   Date:   Time:     Yolanda Foley

## 2016-11-13 DIAGNOSIS — M25412 Effusion, left shoulder: Secondary | ICD-10-CM | POA: Diagnosis not present

## 2016-11-13 DIAGNOSIS — M25612 Stiffness of left shoulder, not elsewhere classified: Secondary | ICD-10-CM | POA: Diagnosis not present

## 2016-11-13 DIAGNOSIS — M25512 Pain in left shoulder: Secondary | ICD-10-CM | POA: Diagnosis not present

## 2016-11-21 DIAGNOSIS — M25612 Stiffness of left shoulder, not elsewhere classified: Secondary | ICD-10-CM | POA: Diagnosis not present

## 2016-11-21 DIAGNOSIS — M25512 Pain in left shoulder: Secondary | ICD-10-CM | POA: Diagnosis not present

## 2016-11-21 DIAGNOSIS — M25412 Effusion, left shoulder: Secondary | ICD-10-CM | POA: Diagnosis not present

## 2016-11-25 DIAGNOSIS — M25612 Stiffness of left shoulder, not elsewhere classified: Secondary | ICD-10-CM | POA: Diagnosis not present

## 2016-11-25 DIAGNOSIS — M25412 Effusion, left shoulder: Secondary | ICD-10-CM | POA: Diagnosis not present

## 2016-11-25 DIAGNOSIS — M25512 Pain in left shoulder: Secondary | ICD-10-CM | POA: Diagnosis not present

## 2016-11-27 ENCOUNTER — Ambulatory Visit (HOSPITAL_COMMUNITY): Payer: Medicare Other | Admitting: Psychiatry

## 2016-12-03 ENCOUNTER — Inpatient Hospital Stay (INDEPENDENT_AMBULATORY_CARE_PROVIDER_SITE_OTHER): Payer: Medicare Other | Admitting: Orthopaedic Surgery

## 2016-12-10 ENCOUNTER — Encounter (INDEPENDENT_AMBULATORY_CARE_PROVIDER_SITE_OTHER): Payer: Self-pay | Admitting: Orthopaedic Surgery

## 2016-12-10 ENCOUNTER — Ambulatory Visit (INDEPENDENT_AMBULATORY_CARE_PROVIDER_SITE_OTHER): Payer: Medicare Other | Admitting: Orthopaedic Surgery

## 2016-12-10 VITALS — BP 128/80 | HR 78 | Resp 16 | Ht 66.0 in | Wt 155.0 lb

## 2016-12-10 DIAGNOSIS — M25512 Pain in left shoulder: Secondary | ICD-10-CM

## 2016-12-10 DIAGNOSIS — G8929 Other chronic pain: Secondary | ICD-10-CM

## 2016-12-10 NOTE — Progress Notes (Signed)
Office Visit Note   Patient: Yolanda Foley           Date of Birth: 1941/02/07           MRN: [id number] Visit Date: 12/10/2016              Requested by: Fayrene Helper, [address], [place], [place] [address] PCP: Fayrene Helper, MD   Assessment & Plan: Visit Diagnoses:  1. Chronic left shoulder pain   2 months status post rotator cuff tear left shoulder doing very well. No present complaints and happy with her present course  Plan: Encourage continued exercises. Plan to see her back in apparent basis  Follow-Up Instructions: Return if symptoms worsen or fail to improve.   Orders:  No orders of the defined types were placed in this encounter.  No orders of the defined types were placed in this encounter.     Procedures: No procedures performed   Clinical Data: No additional findings.   Subjective: Chief Complaint  Patient presents with  . Left Shoulder - Routine Post Op    Yolanda Foley is 810 weeks status post left shoulder arthroscopy. She really the pain is much better and aches sometimes.  Yolanda Foley relates that she's doing quite well postoperatively. She's regained all of her motion. Doesn't having trouble sleeping. No significant pain. No swelling distally and no limitations  HPI  Review of Systems  Constitutional: Negative for chills, fatigue and fever.  Eyes: Negative for itching.  Respiratory: Negative for chest tightness and shortness of breath.   Cardiovascular: Negative for chest pain, palpitations and leg swelling.  Gastrointestinal: Negative for blood in stool, constipation and diarrhea.  Musculoskeletal: Negative for back pain, joint swelling, neck pain and neck stiffness.  Neurological: Negative for dizziness, weakness, numbness and headaches.  Hematological: Does not bruise/bleed easily.  Psychiatric/Behavioral: Negative for sleep disturbance. The patient is not nervous/anxious.      Objective: Vital Signs: BP 128/80    Pulse 78   Resp 16   Ht 5\' 6"  (1.676 m)   Wt 155 lb (70.3 kg)   BMI 25.02 kg/m   Physical Exam  Ortho Exam minimal adhesive capsulitis lacks about the last 10-15 of full overhead motion. To the right side but not a functional loss. Normal internal/external rotation. Able to touch her hand to the middle of her back. Incisions of healed nicely. No localized tenderness. Neurovascular exam intact.  Specialty Comments:  No specialty comments available.  Imaging: No results found.   PMFS History: Patient Active Problem List   Diagnosis Date Noted  . Special screening for malignant neoplasm of colon 07/04/2016  . Irritable behavior 07/03/2016  . Shoulder pain, left 10/08/2014  . 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 Non compliance w medication regimen 07/17/2013  . Onychomycosis 07/17/2013  . Toe trauma 05/18/2013  . Osteoporosis 03/29/2013  . Depression 03/30/2012  . Hearing loss 03/30/2012  . Prediabetes 10/09/2011  . Transaminasemia 07/08/2011  . Insomnia 02/09/2011  . Cholelithiasis 08/05/2010  . ANEMIA 06/22/2009  . FATIGUE 06/22/2009  . ALLERGIC RHINITIS, SEASONAL 03/16/2008  . Essential hypertension 07/19/2007  . GERD 07/19/2007  . Hyperlipemia 04/23/2007   Past Medical History:  Diagnosis Date  . Cholelithiasis   . Chronic pain   . Depression   . Elevated liver enzymes   . GERD (gastroesophageal reflux disease)   . Hyperlipidemia   . Hypertension   . Nonspecific elevation of levels of transaminase or  lactic acid dehydrogenase (LDH)   . Palpitation   . Primary biliary cirrhosis (HCC)     Family History  Problem Relation Age of Onset  . Dementia Mother   . Heart failure Father   . Hypertension Father   . Cancer Brother 82       kidney  . Cancer Maternal Aunt        breast  . Dementia Maternal Aunt     Past Surgical History:  Procedure Laterality Date  . correction of nasal surgery  35 years ago   . Left ovarian cyst removal  1974  .  ROTATOR CUFF REPAIR Left 08/2016  . TONSILLECTOMY  1967    Social History   Occupational History  . retired     Social History Main Topics  . Smoking status: Never Smoker  . Smokeless tobacco: Never Used  . Alcohol use No  . Drug use: No  . Sexual activity: Not Currently

## 2016-12-11 ENCOUNTER — Ambulatory Visit (INDEPENDENT_AMBULATORY_CARE_PROVIDER_SITE_OTHER): Payer: Medicare Other | Admitting: Psychiatry

## 2016-12-11 ENCOUNTER — Encounter (HOSPITAL_COMMUNITY): Payer: Self-pay | Admitting: Psychiatry

## 2016-12-11 DIAGNOSIS — F329 Major depressive disorder, single episode, unspecified: Secondary | ICD-10-CM

## 2016-12-11 DIAGNOSIS — F32A Depression, unspecified: Secondary | ICD-10-CM

## 2016-12-11 NOTE — Progress Notes (Signed)
Patient:  Yolanda Foley   DOB: 1940/12/17  MR Number: [id number]  Location: [facility] [place]:  [address] [place],  [place], [address]  Start: Thursday 12/11/2016 1:15 PM End: Thursday 12/11/2016 2:05 PM  Provider/Observer:     Maurice Small, MSW, LCSW   Chief Complaint:     Depression  Reason For Service:    Yolanda Foley is a 76 y.o. female who is seeking services due to experiencing symptoms of depression. She says  it seems as though she is over sensitive when things don't go her way. She reports finding herself snapping and being irritable. She says she and her husband moved back to [place] in 2001 after leaving in [place] for forty years. She says this is when symptoms of  depression seemed to start. She says she kept together while her husband worked when they were living in [place]. She states she also worked 2 jobs and went to school at the same time. During that time, her husband did not seem to appreciate her efforts. She states he would only comment when something was wrong and continues that same pattern. Per patient's report, the situation has become overbearing over the years. She reports additional stress related to her church. 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 Patient reports feeling unappreciated.    Interventions Strategy:  Supportive,   Participation Level:   Active  Participation Quality:  Appropriate and Sharing      Behavioral Observation:  Casual, Alert, and Appropriate.   Current Psychosocial Factors: Marital stress, phase of life, issues at church  Content of Session:   Established rapport, reviewed symptoms, administered PHQ-9, discussed stressors, facilitated expression of thoughts and feelings regarding stressors, discussed the role of self-care in managing depression, discussed rationale for and provided instructions for completing self-care assessment, assigned  patient to complete self-care assessment and bring to next session  Current Status:   depressed mood, irritability, poor sleep patterns, fatigue, poor concentration, excessive worrying, loss of interest in activities,anxiey  Suicidal/Homicidal:    No  Patient Progress:   [place]. Patient reports little to no change in symptoms since last session. She expresses frustration as she says things affect her very deeply. She is particularly concerned about her performance and is experiencing difficulty adjusting to the age and process. She expresses frustration that she cannot perform tasks and move as quickly as she has in the past. She admits being critical and judgmental of self. She reports a pattern of having difficulty saying no and setting boundaries.  Target Goals:   1. Establish rapport 2. Improve self-care.  Last Reviewed:     Goals Addressed Today:    1,2  Plan:      Return again in 2 weeks.  Impression/Diagnosis:   Patient presents with history of symptoms of depression that began in 2001 when patient moved back to [place] from [place] to take care of my mother who has dementia. She reports no psychiatric hospitalization. Patient  participated in outpatient therapy at [facility] for about a year about 3 months ago.  Patient reports taking an antidepressant as prescribed by PCP for a brief period but it made her sleepy. Current symptoms include depressed mood, irritability, poor sleep patterns, fatigue, poor concentration, excessive worrying, loss of interest in activities, anxiety   Diagnosis:  Axis I: Depressive disorder  Axis II: Deferred   [name], LCSW 12/11/2016

## 2016-12-24 ENCOUNTER — Ambulatory Visit (INDEPENDENT_AMBULATORY_CARE_PROVIDER_SITE_OTHER): Payer: Medicare Other | Admitting: Psychiatry

## 2016-12-24 ENCOUNTER — Encounter (HOSPITAL_COMMUNITY): Payer: Self-pay | Admitting: Psychiatry

## 2016-12-24 DIAGNOSIS — F329 Major depressive disorder, single episode, unspecified: Secondary | ICD-10-CM | POA: Diagnosis not present

## 2016-12-24 DIAGNOSIS — F32A Depression, unspecified: Secondary | ICD-10-CM

## 2016-12-24 NOTE — Progress Notes (Signed)
Patient:  Yolanda Foley   DOB: 1941-04-13  MR Number: [id number]  Location: [facility]:  [address] [place],  [place], [address]  Start: Wednesday 12/24/2016 1:20 PM End: Wednesday 12/24/2016  2:00 PM  Provider/Observer:     Maurice Small, MSW, LCSW   Chief Complaint:     Depression  Reason For Service:    Yolanda Foley is a 76 y.o. female who is seeking services due to experiencing symptoms of depression. She says  it seems as though she is over sensitive when things don't go her way. She reports finding herself snapping and being irritable. She says she and her husband moved back to [place] [employer] in 2001 after leaving in [place] for forty years. She says this is when symptoms of  depression seemed to start. She says she kept together while her husband worked when they were living in [place]. She states she also worked 2 jobs and went to school at the same time. During that time, her husband did not seem to appreciate her efforts. She states he would only comment when something was wrong and continues that same pattern. Per patient's report, the situation has become overbearing over the years. She reports additional stress related to her church. 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 Patient reports feeling unappreciated.    Interventions Strategy:  Supportive,   Participation Level:   Active  Participation Quality:  Appropriate and Sharing      Behavioral Observation:  Casual, Alert, and Appropriate.   Current Psychosocial Factors: Marital stress, phase of life, issues at church  Content of Session:    reviewed symptoms, facilitated expression of thoughts and feelings regarding current stressors, praise and reinforce patient's efforts to complete the self-care assessment, assisted patient identify areas of self-care she would like to improve, discussed sleep hygiene and the role of sleep in  managing depression and stress, assisted patient identify ways to improve sleep hygiene and provided handouts for review,  assisted patient identify and address thoughts and processes that may inhibit implementation of strategies to improve sleep hygiene, began discussing with patient ways to improve balance between self-care   Current Status:   depressed mood, irritability, poor sleep patterns, fatigue, poor concentration, excessive worrying,,anxiey  Suicidal/Homicidal:    No  Patient Progress:   Fair. Patient reports little to no change in symptoms since last session. She reports increased irritability as she expresses frustration regarding husband. She also reports worry as his cognitive abilities seem to begin to decline in May of this year and patient reports having more responsibilities. She reports having little time for self between caring for her husband, household responsibilities, and church responsibilities. She continues to report erratic sleep pattern and poor sleep hygiene.  Target Goals:   1.  Improve self-care.  Last Reviewed:     Goals Addressed Today:    1  Plan:      Return again in 2 weeks.  Impression/Diagnosis:   Patient presents with history of symptoms of depression that began in 2001 when patient moved back to [place] from [place] to take care of my mother who has dementia. She reports no psychiatric hospitalization. Patient  participated in outpatient therapy at [facility] for about a year about 3 months ago.  Patient reports taking an antidepressant as prescribed by PCP for a brief period but it made her sleepy. Current symptoms  include depressed mood, irritability, poor sleep patterns, fatigue, poor concentration, excessive worrying, loss of interest in activities, anxiety   Diagnosis:  Axis I: Depressive Disorder          Axis II: Deferred   Royanne Warshaw, LCSW 12/24/2016

## 2016-12-25 ENCOUNTER — Ambulatory Visit (HOSPITAL_COMMUNITY): Payer: Self-pay | Admitting: Psychiatry

## 2017-01-07 ENCOUNTER — Ambulatory Visit (INDEPENDENT_AMBULATORY_CARE_PROVIDER_SITE_OTHER): Payer: Medicare Other | Admitting: Psychiatry

## 2017-01-07 ENCOUNTER — Encounter (HOSPITAL_COMMUNITY): Payer: Self-pay | Admitting: Psychiatry

## 2017-01-07 DIAGNOSIS — F329 Major depressive disorder, single episode, unspecified: Secondary | ICD-10-CM

## 2017-01-07 DIAGNOSIS — F32A Depression, unspecified: Secondary | ICD-10-CM

## 2017-01-07 NOTE — Progress Notes (Signed)
Patient:  Yolanda Foley   DOB: Dec 29, 1940  MR Number: [id number]  Location: [facility]:  [address] [place],  [place], [address]  Start: Wednesday 01/07/2017 2:15 PM End: Wednesday 01/07/2017 3:02 PM   Provider/Observer:     Maurice Small, MSW, LCSW   Chief Complaint:     Depression  Reason For Service:    Yolanda Foley is a 76 y.o. female who is seeking services due to experiencing symptoms of depression. She says  it seems as though she is over sensitive when things don't go her way. She reports finding herself snapping and being irritable. She says she and her husband moved back to [place] in 2001 after leaving in [place] for forty years. She says this is when symptoms of  depression seemed to start. She says she kept together while her husband worked when they were living in [place]. She states she also worked 2 jobs and went to school at the same time. During that time, her husband did not seem to appreciate her efforts. She states he would only comment when something was wrong and continues that same pattern. Per patient's report, the situation has become overbearing over the years. She reports additional stress related to her church. 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 Patient reports feeling unappreciated.    Interventions Strategy:  Supportive,   Participation Level:   Active  Participation Quality:  Appropriate and Sharing      Behavioral Observation:  Casual, Alert, and Appropriate.   Current Psychosocial Factors: Marital stress, phase of life, issues at church  Content of Session:    reviewed symptoms, praised and reinforced patient's efforts to improve sleep hygiene, discussed effects on patient's mood and behavior, assisted patient identify ways to maintain consistent efforts, discussed strengths and supports, developed treatment plan   Current Status:    less depressed mood,  continued irritability, poor sleep patterns, fatigue, poor concentration, excessive worrying,,anxiey  Suicidal/Homicidal:    No  Patient Progress:   Fair. Patient reports  increased efforts to improve sleep hygiene and says she felt better when she had more sleep. However, she struggles to maintain consistent self-care efforts in this area. She reports sometimes staying up late to complete various projects. Patient continues to worry about their opinions of others and exhibits perfectionistic tendencies.   Target Goals:   1. Learn and implement calming techniques to manage stress and anxiety.    2. Identify, challenge, and replace negative thinking patterns that contribute to anxiety and depression with healthy alternatives.  Last Reviewed:   01/07/2017  Goals Addressed Today:    1,2  Plan:      Return again in 2 weeks.  Impression/Diagnosis:   Patient presents with history of symptoms of depression that began in 2001 when patient moved back to [place] from [place] to take care of my mother who has dementia. She reports no psychiatric hospitalization. Patient  participated in outpatient therapy at [facility] for about a year about 3 months ago.  Patient reports taking an antidepressant as prescribed by PCP for a brief period but it made her sleepy. Current symptoms include depressed mood, irritability, poor sleep patterns, fatigue, poor concentration, excessive worrying, loss of interest in activities, anxiety   Diagnosis:  Axis I: Depressive Disorder          Axis II: Deferred   Hershal Eriksson, LCSW 01/07/2017

## 2017-01-21 ENCOUNTER — Ambulatory Visit (HOSPITAL_COMMUNITY): Payer: Self-pay | Admitting: Psychiatry

## 2017-01-27 ENCOUNTER — Telehealth: Payer: Self-pay | Admitting: Family Medicine

## 2017-01-27 ENCOUNTER — Ambulatory Visit (HOSPITAL_COMMUNITY): Payer: Medicare Other | Admitting: Psychiatry

## 2017-01-27 DIAGNOSIS — E7849 Other hyperlipidemia: Secondary | ICD-10-CM

## 2017-01-27 DIAGNOSIS — I10 Essential (primary) hypertension: Secondary | ICD-10-CM

## 2017-01-27 DIAGNOSIS — D649 Anemia, unspecified: Secondary | ICD-10-CM

## 2017-01-27 NOTE — Telephone Encounter (Signed)
Patient is requesting to have her hemoglobin checked. Says she has a lot of dizziness and weakness since her shoulder surgery in may.  She also is requesting a mammogram order be faxed to [facility] [place].  Cb#: [phone]

## 2017-01-28 NOTE — Telephone Encounter (Signed)
pls order cBC , iron and ferritin, also remind her of appt next week , needs to keep this please will discuss and further eval dizzy symptom then and review lab, have her do fasting lab and add chem 7 to order pls  If you fill out form for mammogram in [place], if due and I   Need to do is sign this helps me and I always accept the help I can get, let me know

## 2017-01-29 NOTE — Telephone Encounter (Signed)
Labs ordered. Called to advise and remind of appointment. Left message

## 2017-02-06 ENCOUNTER — Ambulatory Visit (INDEPENDENT_AMBULATORY_CARE_PROVIDER_SITE_OTHER): Payer: Medicare Other | Admitting: Psychiatry

## 2017-02-06 ENCOUNTER — Telehealth: Payer: Self-pay | Admitting: Family Medicine

## 2017-02-06 ENCOUNTER — Encounter (HOSPITAL_COMMUNITY): Payer: Self-pay | Admitting: Psychiatry

## 2017-02-06 DIAGNOSIS — F329 Major depressive disorder, single episode, unspecified: Secondary | ICD-10-CM

## 2017-02-06 DIAGNOSIS — I10 Essential (primary) hypertension: Secondary | ICD-10-CM | POA: Diagnosis not present

## 2017-02-06 DIAGNOSIS — F32A Depression, unspecified: Secondary | ICD-10-CM

## 2017-02-06 DIAGNOSIS — D649 Anemia, unspecified: Secondary | ICD-10-CM | POA: Diagnosis not present

## 2017-02-06 NOTE — Progress Notes (Signed)
Patient:  Yolanda Foley   DOB: [birth year]  MR Number:    [id number]  Location: [facility]:  [address] [place],  [place], [address]  Start: Friday 02/06/2017 8:17 AM  End: Friday 02/06/2017 9:05 AM   Provider/Observer:     Maurice Small, MSW, LCSW   Chief Complaint:     Depression  Reason For Service:    Yolanda Foley is a 76 y.o. female who is seeking services due to experiencing symptoms of depression. She says  it seems as though she is over sensitive when things don't go her way. She reports finding herself snapping and being irritable. She says she and her husband moved back to [place] in 2001 after leaving in [place] for forty years. She says this is when symptoms of  depression seemed to start. She says she kept together while her husband worked when they were living in [place]. She states she also worked 2 jobs and went to school at the same time. During that time, her husband did not seem to appreciate her efforts. She states he would only comment when something was wrong and continues that same pattern. Per patient's report, the situation has become overbearing over the years. She reports additional stress related to her church. 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 Patient reports feeling unappreciated.    Interventions Strategy:  Supportive,   Participation Level:   Active  Participation Quality:  Appropriate and Sharing      Behavioral Observation:  Casual, Alert, and Appropriate.   Current Psychosocial Factors: Marital stress, phase of life, issues at church  Content of Session:    reviewed symptoms, praised and reinforced patient's efforts to improve sleep hygiene, discussed effects on patient's mood and behavior, assisted patient identify and address thoughts and processes that inhibit consistent efforts regarding sleep hygiene, began to examine patient's schema that mediate  patient's anxiety response, provided psychoeducation on anxiety and stress response, assisted patient identify how she experiences anxiety and stress in her body, discussed ways to trigger a relaxation response including deep breathing, discussed the rationale for and practiced deep breathing, assigned patient to practice 5 minutes 2 times per day  Current Status:    less depressed mood, continued irritability, poor sleep patterns, fatigue, poor concentration, excessive worrying,,anxiey  Suicidal/Homicidal:    No  Patient Progress:   Fair. Patient reports  increased efforts to improve sleep hygiene and says she feels better when she has more sleep. However, she continues to struggle to  maintain consistent self-care efforts in this area. She reports husband wanting to have conversation during the middle of the night often disrupts her efforts. She also reports still having thoughts of needing  to accomplish a variety of tasks as being another factor.   Patient reports continuing to feel stressed and often feeling rushed. She also reports muscle tension. Patient continues to worry about their opinions of others and exhibits perfectionistic tendencies.  Target Goals:   1. Learn and implement calming techniques to manage stress and anxiety.    2. Identify, challenge, and replace negative thinking patterns that contribute to anxiety and depression with healthy alternatives.  Last Reviewed:   01/07/2017  Goals Addressed Today:    1,2  Plan:      Return again in 2 weeks.  Impression/Diagnosis:   Patient presents with history of symptoms of depression that began in  2001 when patient moved back to [place] from [place] to take care of my mother who has dementia. She reports no psychiatric hospitalization. Patient  participated in outpatient therapy at [facility] for about a year about 3 months ago.  Patient reports taking an antidepressant as prescribed by PCP for a brief period but it made her sleepy. Current  symptoms include depressed mood, irritability, poor sleep patterns, fatigue, poor concentration, excessive worrying, loss of interest in activities, anxiety   Diagnosis:  Axis I: Depressive Disorder          Axis II: Deferred   Maleki Hippe, LCSW 02/06/2017

## 2017-02-06 NOTE — Telephone Encounter (Signed)
Normal end June and on no thyroid medication , will not be added, she is aware

## 2017-02-06 NOTE — Telephone Encounter (Signed)
Patient was here this morning for Labs, she has stopped by to ask if you can add THS (she confirms that they have enough blood) we just need to let the Lab know.    Patient's cb   [phone] or [phone]

## 2017-02-07 LAB — CBC
HEMATOCRIT: 33.8 % — AB (ref 35.0–45.0)
Hemoglobin: 11.1 g/dL — ABNORMAL LOW (ref 11.7–15.5)
MCH: 29 pg (ref 27.0–33.0)
MCHC: 32.8 g/dL (ref 32.0–36.0)
MCV: 88.3 fL (ref 80.0–100.0)
MPV: 13.5 fL — ABNORMAL HIGH (ref 7.5–12.5)
PLATELETS: 138 10*3/uL — AB (ref 140–400)
RBC: 3.83 10*6/uL (ref 3.80–5.10)
RDW: 13 % (ref 11.0–15.0)
WBC: 4.4 10*3/uL (ref 3.8–10.8)

## 2017-02-07 LAB — COMPLETE METABOLIC PANEL WITH GFR
AG Ratio: 0.8 (calc) — ABNORMAL LOW (ref 1.0–2.5)
ALBUMIN MSPROF: 3.9 g/dL (ref 3.6–5.1)
ALT: 52 U/L — ABNORMAL HIGH (ref 6–29)
AST: 71 U/L — ABNORMAL HIGH (ref 10–35)
Alkaline phosphatase (APISO): 393 U/L — ABNORMAL HIGH (ref 33–130)
BUN: 10 mg/dL (ref 7–25)
CO2: 29 mmol/L (ref 20–32)
CREATININE: 0.77 mg/dL (ref 0.60–0.93)
Calcium: 9 mg/dL (ref 8.6–10.4)
Chloride: 102 mmol/L (ref 98–110)
GFR, EST AFRICAN AMERICAN: 86 mL/min/{1.73_m2} (ref >=60)
GFR, Est Non African American: 74 mL/min/{1.73_m2} (ref >=60)
GLUCOSE: 79 mg/dL (ref 65–99)
Globulin: 4.7 g/dL (calc) — ABNORMAL HIGH (ref 1.9–3.7)
Potassium: 4.1 mmol/L (ref 3.5–5.3)
Sodium: 136 mmol/L (ref 135–146)
TOTAL PROTEIN: 8.6 g/dL — AB (ref 6.1–8.1)
Total Bilirubin: 0.4 mg/dL (ref 0.2–1.2)

## 2017-02-07 LAB — FERRITIN: Ferritin: 910 ng/mL — ABNORMAL HIGH (ref 20–288)

## 2017-02-07 LAB — IRON: Iron: 101 ug/dL (ref 45–160)

## 2017-02-10 ENCOUNTER — Ambulatory Visit (HOSPITAL_COMMUNITY): Payer: Self-pay | Admitting: Psychiatry

## 2017-02-12 ENCOUNTER — Encounter: Payer: Self-pay | Admitting: Family Medicine

## 2017-02-12 ENCOUNTER — Ambulatory Visit (INDEPENDENT_AMBULATORY_CARE_PROVIDER_SITE_OTHER): Payer: Medicare Other | Admitting: Family Medicine

## 2017-02-12 VITALS — BP 154/90 | HR 82 | Temp 97.7°F | Resp 16 | Ht 66.0 in | Wt 158.5 lb

## 2017-02-12 DIAGNOSIS — Z1231 Encounter for screening mammogram for malignant neoplasm of breast: Secondary | ICD-10-CM

## 2017-02-12 DIAGNOSIS — K743 Primary biliary cirrhosis: Secondary | ICD-10-CM

## 2017-02-12 DIAGNOSIS — I10 Essential (primary) hypertension: Secondary | ICD-10-CM | POA: Diagnosis not present

## 2017-02-12 DIAGNOSIS — Z9114 Patient's other noncompliance with medication regimen: Secondary | ICD-10-CM

## 2017-02-12 MED ORDER — OLMESARTAN MEDOXOMIL 40 MG PO TABS: 40.0000 mg | ORAL_TABLET | Freq: Every day | ORAL | 3 refills | Status: DC

## 2017-02-12 NOTE — Assessment & Plan Note (Signed)
Liver enzymes markedly elevated and pt has poor appetite, fatigue and weight loss, GI eval asap, non compliant with meds

## 2017-02-12 NOTE — Patient Instructions (Signed)
F/u in 5 weeks, cal;l if you need me before  Please get appointment for mammogram at checkout today, before you leave , long overdue  Please get appointment with gastro asap, and resume medication prescribed by gastro  Start taking blood pressure medicine sent in today benicar 40 mg one daily  Hope you start feeling better, I believe you will once you start taking medications that  you need over time

## 2017-02-15 NOTE — Progress Notes (Signed)
   Yolanda Foley     MRN: [id number]      DOB: 03/26/1941   HPI Yolanda Foley is here for follow up and re-evaluation of chronic medical conditions, medication management and review of any available recent lab and radiology data.  Preventive health is updated, specifically  Cancer screening and Immunization. Refusing flu avccine  Has recovered from shoulder surgery, and reports good mobility and success, had problems with medication and recovery initially Going to therapy for depression, is on no medication but is doing well with the therapist The PT denies any adverse reactions to current medications since the last visit.  C/o chronic fatigue and poor appetite, just does not feel well. Has stopped taking medication for PBC,no reason ROS Denies recent fever or chills. Denies sinus pressure, nasal congestion, ear pain or sore throat. Denies chest congestion, productive cough or wheezing. Denies chest pains, palpitations and leg swelling Denies abdominal pain, has early satiety , and weight loss and fatigue Denies dysuria, frequency, hesitancy or incontinence. Denies joint pain, swelling and limitation in mobility. Denies headaches, seizures, numbness, or tingling.  Denies skin break down or rash.   PE  BP (!) 154/90   Pulse 82   Temp 97.7 F (36.5 C) (Other (Comment))   Resp 16   Ht 5\' 6"  (1.676 m)   Wt 158 lb 8 oz (71.9 kg)   SpO2 98%   BMI 25.58 kg/m   Patient alert and oriented and in no cardiopulmonary distress Ill appearing with obvious weight loss HEENT: No facial asymmetry, EOMI,   oropharynx pink and moist.  Neck supple no JVD, no mass.  Chest: Clear to auscultation bilaterally.  CVS: S1, S2 no murmurs, no S3.Regular rate.  ABD: Soft mild epigastric tenderness, no guarding or rebound.   Ext: No edema  MS: Adequate ROM spine, shoulders, hips and knees.  Skin: Intact, no ulcerations or rash noted.  Psych: Good eye contact, flat  affect. Memory intact not anxious  mildly  depressed appearing.  CNS: CN 2-12 intact, power,  normal throughout.no focal deficits noted.   Assessment & Plan  Primary biliary cirrhosis (HCC) Liver enzymes markedly elevated and pt has poor appetite, fatigue and weight loss, GI eval asap, non compliant with meds  Essential hypertension Uncontrolled, on no medication, resume benicar 40 mg daily, return for re evaluation in 6 weeks  Cholelithiasis May be currently symptomatic and may need to be re evaluated for surgery  Non compliance w medication regimen Need to resume treatment for her PBC is stressed and she needs to schedule appt with GI asap , missed appt which  Was due 4 months ago, has been off medication and now enzymes are elevated

## 2017-02-15 NOTE — Assessment & Plan Note (Signed)
May be currently symptomatic and may need to be re evaluated for surgery

## 2017-02-15 NOTE — Assessment & Plan Note (Signed)
Uncontrolled, on no medication, resume benicar 40 mg daily, return for re evaluation in 6 weeks

## 2017-02-15 NOTE — Assessment & Plan Note (Signed)
Need to resume treatment for her PBC is stressed and she needs to schedule appt with GI asap , missed appt which  Was due 4 months ago, has been off medication and now enzymes are elevated

## 2017-02-18 ENCOUNTER — Encounter (HOSPITAL_COMMUNITY): Payer: Self-pay

## 2017-02-18 ENCOUNTER — Ambulatory Visit (HOSPITAL_COMMUNITY)
Admission: RE | Admit: 2017-02-18 | Discharge: 2017-02-18 | Disposition: A | Discharge status: Home / Self Care | Payer: Medicare Other | Source: Ambulatory Visit | Attending: Family Medicine | Admitting: Family Medicine

## 2017-02-18 ENCOUNTER — Ambulatory Visit: Payer: Self-pay | Admitting: Family Medicine

## 2017-02-18 DIAGNOSIS — Z1231 Encounter for screening mammogram for malignant neoplasm of breast: Secondary | ICD-10-CM | POA: Insufficient documentation

## 2017-02-19 ENCOUNTER — Ambulatory Visit (INDEPENDENT_AMBULATORY_CARE_PROVIDER_SITE_OTHER): Payer: Self-pay | Admitting: Internal Medicine

## 2017-02-24 ENCOUNTER — Encounter (HOSPITAL_COMMUNITY): Payer: Self-pay | Admitting: Psychiatry

## 2017-02-24 ENCOUNTER — Ambulatory Visit (INDEPENDENT_AMBULATORY_CARE_PROVIDER_SITE_OTHER): Payer: Medicare Other | Admitting: Psychiatry

## 2017-02-24 DIAGNOSIS — F329 Major depressive disorder, single episode, unspecified: Secondary | ICD-10-CM | POA: Diagnosis not present

## 2017-02-24 DIAGNOSIS — F32A Depression, unspecified: Secondary | ICD-10-CM

## 2017-02-24 NOTE — Progress Notes (Signed)
Patient:  Yolanda Foley   DOB: Jun 26, 1940  MR Number:    [id number]  Location: [employer]:  [address] [place],  [place], [address]  Start: Tuesday 02/24/2017 2:17 PM  End: Tuesday 02/24/2017 3:05 PM          Provider/Observer:     Maurice Small, MSW, LCSW   Chief Complaint:     Depression  Reason For Service:    Yolanda Foley is a 76 y.o. female who is seeking services due to experiencing symptoms of depression. She says  it seems as though she is over sensitive when things don't go her way. She reports finding herself snapping and being irritable. She says she and her husband moved back to [place] in 2001 after leaving in [place] for forty years. She says this is when symptoms of  depression seemed to start. She says she kept together while her husband worked when they were living in [place]. She states she also worked 2 jobs and went to school at the same time. During that time, her husband did not seem to appreciate her efforts. She states he would only comment when something was wrong and continues that same pattern. Per patient's report, the situation has become overbearing over the years. She reports additional stress related to her church. 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 Patient reports feeling unappreciated.    Interventions Strategy:  Supportive,   Participation Level:   Active  Participation Quality:  Appropriate and Sharing      Behavioral Observation:  Casual, Alert, and Appropriate.   Current Psychosocial Factors: Marital stress, phase of life, issues at church  Content of Session:    reviewed symptoms, praised and reinforced patient's efforts to improve sleep hygiene and practice deep breathing regularly, discussed effects on patient's mood and behavior, discussed rationale for and practiced relaxation technique using progressive muscle relaxation   Current Status:     less depressed mood, decreased irritability, improved sleep patterns, decreased fatigue, decreased worry and anxiety  Suicidal/Homicidal:    No  Patient Progress:   Good. Patient reports continued  increased efforts to improve sleep hygiene and reports improvement in this area. She also reports practicing deep breathing regularly and reports this has been very helpful. She reports earlier recognition of her body becoming tense and successfully using relaxation technique. She reports overall reduced muscle tension. She also has become more aware of her thoughts and patterns of behavior. She states learning to let some things go and reports feeling more freedom.  Target Goals:   1. Learn and implement calming techniques to manage stress and anxiety.    2. Identify, challenge, and replace negative thinking patterns that contribute to anxiety and depression with healthy alternatives.  Last Reviewed:   01/07/2017  Goals Addressed Today:    1,2  Plan:      Return again in 2 weeks.  Impression/Diagnosis:   Patient presents with history of symptoms of depression that began in 2001 when patient moved back to [place] from [place] to take care of my mother who has dementia. She reports no psychiatric hospitalization. Patient  participated in outpatient therapy at [facility] for about a year about 3 months ago.  Patient reports taking an antidepressant as prescribed by PCP for a brief period but it made her sleepy. Symptoms include depressed mood, irritability, poor sleep patterns, fatigue, poor  concentration, excessive worrying, loss of interest in activities, anxiety   Diagnosis:  Axis I: Depressive Disorder          Axis II: Deferred   Yolanda Bedore, LCSW 02/24/2017

## 2017-03-02 ENCOUNTER — Encounter (INDEPENDENT_AMBULATORY_CARE_PROVIDER_SITE_OTHER): Payer: Self-pay | Admitting: Internal Medicine

## 2017-03-02 ENCOUNTER — Ambulatory Visit (INDEPENDENT_AMBULATORY_CARE_PROVIDER_SITE_OTHER): Payer: Medicare Other | Admitting: Internal Medicine

## 2017-03-02 VITALS — BP 160/82 | HR 80 | Temp 98.0°F | Ht 65.0 in | Wt 159.3 lb

## 2017-03-02 DIAGNOSIS — K743 Primary biliary cirrhosis: Secondary | ICD-10-CM | POA: Diagnosis not present

## 2017-03-02 NOTE — Patient Instructions (Signed)
Labs today and OV in 1 year. 

## 2017-03-02 NOTE — Progress Notes (Addendum)
Subjective:    Patient ID: Yolanda Foley, female    DOB: 1940/06/13, 76 y.o.   MRN: [id number]  HPI Here today for f/u. Last seen in October of 2017. 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  Liver enzymes have been normal. She had rotator cuff in May on her left shoulder. She stopped taking in May and was off x 4 months. She started Actigall back last month. Her appetite is good. She has lost 6 pounds since her last visit. Her appetite is good.  Marlana Salvage has a BM daily. No melena or BRRB 02/06/2017 ALP 393 CMP     Component Value Date/Time   NA 136 02/06/2017 0923   K 4.1 02/06/2017 0923   CL 102 02/06/2017 0923   CO2 29 02/06/2017 0923   GLUCOSE 79 02/06/2017 0923   BUN 10 02/06/2017 0923   CREATININE 0.77 02/06/2017 0923   CALCIUM 9.0 02/06/2017 0923   PROT 8.6 (H) 02/06/2017 0923   ALBUMIN 3.9 05/16/2016 1613   AST 71 (H) 02/06/2017 0923   ALT 52 (H) 02/06/2017 0923   ALKPHOS 123 05/16/2016 1613   BILITOT 0.4 02/06/2017 0923   GFRNONAA 74 02/06/2017 0923   GFRAA 86 02/06/2017 0923    08/16/2015 [place]: F2 and some F3.          Review of Systems Past Medical History:  Diagnosis Date  . Cholelithiasis   . Chronic pain   . Depression   . Elevated liver enzymes   . GERD (gastroesophageal reflux disease)   . Hyperlipidemia   . Hypertension   . Nonspecific elevation of levels of transaminase or lactic acid dehydrogenase (LDH)   . Palpitation   . Primary biliary cirrhosis [facility])     Past Surgical History:  Procedure Laterality Date  . correction of nasal surgery  35 years ago   . Left ovarian cyst removal  1974  . ROTATOR CUFF REPAIR Left 08/2016  . TONSILLECTOMY  1967     Allergies  Allergen Reactions  . [place]   . Fluoxetine Other (See Comments)    Dull headaache  . Oxycodone Nausea And Vomiting  . Sulfonamide Derivatives     Current Outpatient Medications on File Prior to Visit  Medication Sig  Dispense Refill  . aspirin (ASPIRIN LOW DOSE) 81 MG EC tablet Take 81 mg by mouth as needed. Take one tablet by mouth once a day    . Chlorophyll 50 MG CAPS Take 3 capsules by mouth daily.    . Coenzyme Q10-Fish Oil-Vit E (CO-Q 10 OMEGA-3 FISH OIL) CAPS Take 2 capsules by mouth daily.    . Digestive Enzymes (PAPAYA ENZYME) CHEW Chew by mouth as needed.    . Fish Oil-Krill Oil (KRILL OIL PLUS) CAPS Take 1 capsule by mouth daily.    Nyoka Cowden Tea, Camillia sinensis, (GREEN TEA PO) Take by mouth. Patient states that this is a decaf tea and she drinks it 1-2 times daily    . MACA ROOT PO Take 523 mg by mouth.    . Methylsulfonylmethane (MSM) 500 MG CAPS Take 1 capsule by mouth daily.     . Milk Thistle 200 MG CAPS Take 1 capsule by mouth 2 (two) times daily.    . Multiple Vitamin (MULTIVITAMIN) tablet Take 1 tablet by mouth daily. With iron    . olmesartan (BENICAR) 40 MG tablet Take 1 tablet (40 mg total) by mouth daily.  30 tablet 3  . ursodiol (ACTIGALL) 300 MG capsule TAKE ONE CAPSULE BY MOUTH THREE TIMES DAILY AFTER A MEAL 90 capsule 4  . vitamin B-12 (CYANOCOBALAMIN) 1000 MCG tablet Take 1,000 mcg by mouth daily.    . vitamin C (ASCORBIC ACID) 500 MG tablet Take 500 mg by mouth 2 (two) times daily.     No current facility-administered medications on file prior to visit.         Objective:   Physical Exam Blood pressure (!) 160/82, pulse 80, temperature 98 F (36.7 C), height 5\' 5"  (1.651 m), weight 159 lb 4.8 oz (72.3 kg). Alert and oriented. Skin warm and dry. Oral mucosa is moist.   . Sclera anicteric, conjunctivae is pink. Thyroid not enlarged. No cervical lymphadenopathy. Lungs clear. Heart regular rate and rhythm.  Abdomen is soft. Bowel sounds are positive. No hepatomegaly. No abdominal masses felt. No tenderness.  No edema to lower extremities          Assessment & Plan:  PBC. She is doing well. Started taking Actigall after being off for 5 months. ALP is elevated significantly.   Will get a hepatic function  And sedrate today.

## 2017-03-03 LAB — HEPATIC FUNCTION PANEL
AG Ratio: 0.9 (calc) — ABNORMAL LOW (ref 1.0–2.5)
ALT: 30 U/L — ABNORMAL HIGH (ref 6–29)
AST: 38 U/L — ABNORMAL HIGH (ref 10–35)
Albumin: 3.7 g/dL (ref 3.6–5.1)
Alkaline phosphatase (APISO): 212 U/L — ABNORMAL HIGH (ref 33–130)
BILIRUBIN DIRECT: 0.1 mg/dL (ref 0.0–0.2)
BILIRUBIN INDIRECT: 0.3 mg/dL (ref 0.2–1.2)
BILIRUBIN TOTAL: 0.4 mg/dL (ref 0.2–1.2)
GLOBULIN: 4.1 g/dL — AB (ref 1.9–3.7)
Total Protein: 7.8 g/dL (ref 6.1–8.1)

## 2017-03-03 LAB — SEDIMENTATION RATE: SED RATE: 70 mm/h — AB (ref 0–30)

## 2017-03-05 ENCOUNTER — Other Ambulatory Visit (INDEPENDENT_AMBULATORY_CARE_PROVIDER_SITE_OTHER): Payer: Self-pay | Admitting: Internal Medicine

## 2017-03-10 ENCOUNTER — Ambulatory Visit (INDEPENDENT_AMBULATORY_CARE_PROVIDER_SITE_OTHER): Payer: Medicare Other | Admitting: Psychiatry

## 2017-03-10 ENCOUNTER — Encounter (HOSPITAL_COMMUNITY): Payer: Self-pay | Admitting: Psychiatry

## 2017-03-10 DIAGNOSIS — F32A Depression, unspecified: Secondary | ICD-10-CM

## 2017-03-10 DIAGNOSIS — F329 Major depressive disorder, single episode, unspecified: Secondary | ICD-10-CM

## 2017-03-10 NOTE — Progress Notes (Signed)
Patient:  Yolanda Foley   DOB: 1940/09/30  MR Number:    [id number]  Location: [place]:  [address] [place],  [place], [address]  Start: Tuesday  03/10/2017 1:22 PM  End: Tuesday  03/10/2017 2:01 PM       Provider/Observer:     Maurice Small, MSW, LCSW   Chief Complaint:     Depression  Reason For Service:    Yolanda Foley is a 76 y.o. female who is seeking services due to experiencing symptoms of depression. She says  it seems as though she is over sensitive when things don't go her way. She reports finding herself snapping and being irritable. She says she and her husband moved back to [place] in 2001 after leaving in [place] for forty years. She says this is when symptoms of  depression seemed to start. She says she kept together while her husband worked when they were living in [place]. She states she also worked 2 jobs and went to school at the same time. During that time, her husband did not seem to appreciate her efforts. She states he would only comment when something was wrong and continues that same pattern. Per patient's report, the situation has become overbearing over the years. She reports additional stress related to her church. 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 Patient reports feeling unappreciated.    Interventions Strategy:  Supportive,   Participation Level:   Active  Participation Quality:  Appropriate and Sharing      Behavioral Observation:  Casual, Alert, and Appropriate.   Current Psychosocial Factors: Marital stress, phase of life, issues at church  Content of Session:    reviewed symptoms, praised and reinforced patient's efforts to practice relaxation techniques, discussed effects, began to examine patient's thought patterns and discussed effects on her mood and expectations of others/self, assisted patient to identify realistic expectations of self, began  to discuss perfectionism, provided patient with handout and assigned patient to review for next session, assigned patient to continue practicing relaxation techniques  Current Status:    less depressed mood, decreased irritability, improved sleep patterns, decreased fatigue, decreased worry and anxiety  Suicidal/Homicidal:    No  Patient Progress:   Good. Patient reports continued efforts to practice techniques and reports this has been helpful. She is participating in the Silver sneakers program at the [facility] consistently and says this has helped as well. She expresses frustration with self and husband regarding unmet expectations regarding household responsibilities and other issues.   Target Goals:   1. Learn and implement calming techniques to manage stress and anxiety.    2. Identify, challenge, and replace negative thinking patterns that contribute to anxiety and depression with healthy alternatives.  Last Reviewed:   01/07/2017  Goals Addressed Today:    1,2  Plan:      Return again in 2 weeks.  Impression/Diagnosis:   Patient presents with history of symptoms of depression that began in 2001 when patient moved back to [place] from [place] to take care of my mother who has dementia. She reports no psychiatric hospitalization. Patient  participated in outpatient therapy at [facility] for about a year about 3 months ago.  Patient reports taking an antidepressant as prescribed by PCP for a brief period but it made her sleepy. Symptoms include depressed mood, irritability, poor sleep patterns, fatigue, poor concentration, excessive worrying,  loss of interest in activities, anxiety   Diagnosis:  Axis I: Depressive Disorder          Axis II: Deferred   Keyle Doby, LCSW 03/10/2017

## 2017-03-23 ENCOUNTER — Ambulatory Visit (INDEPENDENT_AMBULATORY_CARE_PROVIDER_SITE_OTHER): Payer: Medicare Other | Admitting: Psychiatry

## 2017-03-23 ENCOUNTER — Encounter (HOSPITAL_COMMUNITY): Payer: Self-pay | Admitting: Psychiatry

## 2017-03-23 DIAGNOSIS — F329 Major depressive disorder, single episode, unspecified: Secondary | ICD-10-CM | POA: Diagnosis not present

## 2017-03-23 DIAGNOSIS — F32A Depression, unspecified: Secondary | ICD-10-CM

## 2017-03-23 NOTE — Progress Notes (Signed)
Patient:  Yolanda Foley   DOB: 25-Apr-1941  MR Number:    [id number]  Location: [place]:  [address] [place],  [place], [address]  Start: Monday 03/23/2017 1:14 PM End: Monday 03/23/2017 2:00 PM     Provider/Observer:     Maurice Small, MSW, LCSW   Chief Complaint:     Depression  Reason For Service:    Yolanda Foley is a 76 y.o. female who is seeking services due to experiencing symptoms of depression. She says  it seems as though she is over sensitive when things don't go her way. She reports finding herself snapping and being irritable. She says she and her husband moved back to [place] in 2001 after leaving in [place] for forty years. She says this is when symptoms of  depression seemed to start. She says she kept together while her husband worked when they were living in [place]. She states she also worked 2 jobs and went to school at the same time. During that time, her husband did not seem to appreciate her efforts. She states he would only comment when something was wrong and continues that same pattern. Per patient's report, the situation has become overbearing over the years. She reports additional stress related to her church. 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 Patient reports feeling unappreciated.    Interventions Strategy:  Supportive, psychoeducation, CBT  Participation Level:   Active  Participation Quality:  Appropriate and Sharing      Behavioral Observation:  Casual, Alert, and Appropriate.   Current Psychosocial Factors: Marital stress, phase of life, issues at church  Content of Session:    reviewed symptoms, provided psychoeducation on perfectionism, discussed areas of patient's life in which she experiences perfectionism, assisted patient identify her perfectionistic behaviors, discussed the effects perfectionism in patient's life regarding her  thoughts/mood/behavior, began to introduce patient to CBT model   Current Status:    less depressed mood, decreased irritability, improved sleep patterns, decreased fatigue, decreased worry and anxiety  Suicidal/Homicidal:    No  Patient Progress:   Good. Patient reports continuing practicing relaxation techniques and participating in [place] sneakers program at the [facility]. She reports less anxiety but continues to express frustration with self regarding failing to meet some of her expectations. She remains judgmental of self and often compares self to others.   Target Goals:   1. Learn and implement calming techniques to manage stress and anxiety.    2. Identify, challenge, and replace negative thinking patterns that contribute to anxiety and depression with healthy alternatives.  Last Reviewed:   01/07/2017  Goals Addressed Today:    1,2  Plan:      Return again in 2 weeks.  Impression/Diagnosis:   Patient presents with history of symptoms of depression that began in 2001 when patient moved back to [place] from [place] to take care of my mother who has dementia. She reports no psychiatric hospitalization. Patient  participated in outpatient therapy at [facility] for about a year about 3 months ago.  Patient reports taking an antidepressant as prescribed by PCP for a brief period but it made her sleepy. Symptoms include depressed mood, irritability, poor sleep patterns, fatigue, poor concentration, excessive worrying, loss of interest in activities, anxiety   Diagnosis:  Axis I: Depressive Disorder          Axis II: Deferred  Tracy, [place] 03/23/2017

## 2017-03-24 ENCOUNTER — Encounter: Payer: Self-pay | Admitting: Family Medicine

## 2017-03-24 ENCOUNTER — Ambulatory Visit (INDEPENDENT_AMBULATORY_CARE_PROVIDER_SITE_OTHER): Payer: Medicare Other | Admitting: Family Medicine

## 2017-03-24 VITALS — BP 160/82 | HR 77 | Resp 16 | Ht 65.0 in | Wt 160.0 lb

## 2017-03-24 DIAGNOSIS — I10 Essential (primary) hypertension: Secondary | ICD-10-CM

## 2017-03-24 DIAGNOSIS — K743 Primary biliary cirrhosis: Secondary | ICD-10-CM | POA: Diagnosis not present

## 2017-03-24 DIAGNOSIS — E782 Mixed hyperlipidemia: Secondary | ICD-10-CM

## 2017-03-24 DIAGNOSIS — K219 Gastro-esophageal reflux disease without esophagitis: Secondary | ICD-10-CM | POA: Diagnosis not present

## 2017-03-24 DIAGNOSIS — F320 Major depressive disorder, single episode, mild: Secondary | ICD-10-CM | POA: Diagnosis not present

## 2017-03-24 MED ORDER — AMLODIPINE BESYLATE 5 MG PO TABS: 5.0000 mg | ORAL_TABLET | Freq: Every day | ORAL | 3 refills | Status: DC

## 2017-03-24 NOTE — Addendum Note (Signed)
Addended by: Tula Nakayama E on: 03/24/2017 03:00 PM   Modules accepted: Orders

## 2017-03-24 NOTE — Patient Instructions (Signed)
F/u in 2.5 month, call if you need me before  New additional medication for blood pressure, amlodipine 5 mg one daily, please take every day at the same time with the benicar  Thankful you are doing better  Please change snacks , and caffeine products  VEGETABLE , FRUIT, beans, water are ehalthy  Enjoy silver sneakers  Keep therapy going!!  Thank you  for choosing [facility]. We consider it a privelige to serve you.  Delivering excellent health care in a caring and  compassionate way is our goal.  Partnering with you,  so that together we can achieve this goal is our strategy.

## 2017-03-29 ENCOUNTER — Encounter: Payer: Self-pay | Admitting: Family Medicine

## 2017-03-29 NOTE — Assessment & Plan Note (Signed)
Improved now that she is in therapy

## 2017-03-29 NOTE — Progress Notes (Signed)
   Yolanda Foley     MRN: [id number]      DOB: 05-31-1940   HPI Yolanda Foley is here for follow up and re-evaluation of chronic medical conditions, medication management and review of any available recent lab and radiology data.  Preventive health is updated, specifically  Cancer screening and Immunization.   Questions or concerns regarding consultations or procedures which the PT has had in the interim are  addressed. The PT denies any adverse reactions to current medications since the last visit.  There are no new concerns.  There are no specific complaints   ROS Denies recent fever or chills. Denies sinus pressure, nasal congestion, ear pain or sore throat. Denies chest congestion, productive cough or wheezing. Denies chest pains, palpitations and leg swelling Denies abdominal pain, nausea, vomiting,diarrhea or constipation.   Denies dysuria, frequency, hesitancy or incontinence. Denies joint pain, swelling and limitation in mobility. Denies headaches, seizures, numbness, or tingling. Denies depression, anxiety or insomnia. Denies skin break down or rash.   PE  BP (!) 160/82   Pulse 77   Resp 16   Ht 5\' 5"  (1.651 m)   Wt 160 lb (72.6 kg)   SpO2 95%   BMI 26.63 kg/m   Patient alert and oriented and in no cardiopulmonary distress.  HEENT: No facial asymmetry, EOMI,   oropharynx pink and moist.  Neck supple no JVD, no mass.  Chest: Clear to auscultation bilaterally.  CVS: S1, S2 no murmurs, no S3.Regular rate.  ABD: Soft non tender.   Ext: No edema  MS: Adequate ROM spine, shoulders, hips and knees.  Skin: Intact, no ulcerations or rash noted.  Psych: Good eye contact, normal affect. Memory intact not anxious or depressed appearing.  CNS: CN 2-12 intact, power,  normal throughout.no focal deficits noted.   Assessment & Plan  Essential hypertension Uncontolled, ad amlodipine DASH diet and commitment to daily physical activity for a minimum of 30 minutes discussed  and encouraged, as a part of hypertension management. The importance of attaining a healthy weight is also discussed.  BP/Weight 03/24/2017 03/02/2017 02/12/2017 12/10/2016 11/05/2016 3/54/5625 09/29/8935  Systolic BP 342 876 811 572 - - 98  Diastolic BP 82 82 90 80 - - 63  Wt. (Lbs) 160 159.3 158.5 155 150 163 163  BMI 26.63 26.51 25.58 25.02 24.21 27.12 27.12       GERD Controlled, no change in medication   Hyperlipemia Hyperlipidemia:Low fat diet discussed and encouraged.   Lipid Panel  Lab Results  Component Value Date   CHOL 182 09/17/2016   HDL 63 09/17/2016   LDLCALC 105 (H) 09/17/2016   TRIG 70 09/17/2016   CHOLHDL 2.9 09/17/2016  needs to reduce fried and fatty foods     Primary biliary cirrhosis ([place]) Improved now that she has started taking medication once more  Depression Improved now that she is in therapy

## 2017-03-29 NOTE — Assessment & Plan Note (Signed)
Hyperlipidemia:Low fat diet discussed and encouraged.   Lipid Panel  Lab Results  Component Value Date   CHOL 182 09/17/2016   HDL 63 09/17/2016   LDLCALC 105 (H) 09/17/2016   TRIG 70 09/17/2016   CHOLHDL 2.9 09/17/2016  needs to reduce fried and fatty foods

## 2017-03-29 NOTE — Assessment & Plan Note (Signed)
Controlled, no change in medication  

## 2017-03-29 NOTE — Assessment & Plan Note (Addendum)
Improved now that she has started taking medication once more

## 2017-03-29 NOTE — Assessment & Plan Note (Signed)
Uncontolled, ad amlodipine DASH diet and commitment to daily physical activity for a minimum of 30 minutes discussed and encouraged, as a part of hypertension management. The importance of attaining a healthy weight is also discussed.  BP/Weight 03/24/2017 03/02/2017 02/12/2017 12/10/2016 11/05/2016 9/76/7341 12/29/7900  Systolic BP 409 735 329 924 - - 98  Diastolic BP 82 82 90 80 - - 63  Wt. (Lbs) 160 159.3 158.5 155 150 163 163  BMI 26.63 26.51 25.58 25.02 24.21 27.12 27.12

## 2017-03-31 ENCOUNTER — Encounter: Payer: Self-pay | Admitting: Family Medicine

## 2017-04-02 ENCOUNTER — Ambulatory Visit (INDEPENDENT_AMBULATORY_CARE_PROVIDER_SITE_OTHER): Payer: Medicare Other | Admitting: Psychiatry

## 2017-04-02 ENCOUNTER — Encounter (HOSPITAL_COMMUNITY): Payer: Self-pay | Admitting: Psychiatry

## 2017-04-02 DIAGNOSIS — F329 Major depressive disorder, single episode, unspecified: Secondary | ICD-10-CM | POA: Diagnosis not present

## 2017-04-02 DIAGNOSIS — F32A Depression, unspecified: Secondary | ICD-10-CM

## 2017-04-02 NOTE — Progress Notes (Signed)
Patient:  Yolanda Foley   DOB: 1941-01-30  MR Number:    [id number]  Location: [place]:  [address] [place],  [place], [address]  Start: Thursday 04/02/2017 1:21 PM End: Thursday 04/02/2017 2:10 PM  Provider/Observer:     Maurice Small, MSW, LCSW   Chief Complaint:     Depression  Reason For Service:    Yolanda Foley is a 76 y.o. female who is seeking services due to experiencing symptoms of depression. She says  it seems as though she is over sensitive when things don't go her way. She reports finding herself snapping and being irritable. She says she and her husband moved back to [place] in 2001 after leaving in [place] for forty years. She says this is when symptoms of  depression seemed to start. She says she kept together while her husband worked when they were living in [place]. She states she also worked 2 jobs and went to school at the same time. During that time, her husband did not seem to appreciate her efforts. She states he would only comment when something was wrong and continues that same pattern. Per patient's report, the situation has become overbearing over the years. She reports additional stress related to her church. 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 Patient reports feeling unappreciated.    Interventions Strategy:  Supportive, psychoeducation, CBT  Participation Level:   Active  Participation Quality:  Appropriate and Sharing      Behavioral Observation:  Casual, Alert, and Appropriate.   Current Psychosocial Factors: Marital stress, phase of life, issues at church  Content of Session:    reviewed symptoms, praise and reinforced patient's increased socialization and involvement in activity, discuss effects on mood/behaviors/and relationships, continued discussing the CBT model, discussed the relationship between distorted thinking and negative emotions, discussed  key concepts regarding types of distorted thinking, assisted patient apply key concepts regarding distorted thinking to her own experience, assisted patient identify, challenge, and replace cognitive self talk, assigned patient to complete handout regarding experiences she has with distorted thinking between now and next session, bring to next session  Current Status:    less depressed mood, decreased irritability, improved sleep patterns, decreased fatigue, decreased worry and anxiety  Suicidal/Homicidal:    No  Patient Progress:   Good. Patient reports continuing practicing relaxation techniques and participating in [place] sneakers program at the [facility]. She also reports recently attending a social event at church and being involved on a committee. Patient reports feeling very positive about the meeting. She also reports being less frustrated with husband regarding certain issues and having more realistic expectations. She is excited about the holidays as she is going to [place] to visit her children for 2 weeks.   Target Goals:   1. Learn and implement calming techniques to manage stress and anxiety.    2. Identify, challenge, and replace negative thinking patterns that contribute to anxiety and depression with healthy alternatives.  Last Reviewed:   01/07/2017  Goals Addressed Today:    1,2  Plan:      Return again in 2 weeks.  Impression/Diagnosis:   Patient presents with history of symptoms of depression that began in 2001 when patient moved back to [place] from [place] to take care of my mother who has dementia. She reports no psychiatric hospitalization. Patient  participated in outpatient therapy at [facility]  for about a year about 3 months ago.  Patient reports taking an antidepressant as prescribed by PCP for a brief period but it made her sleepy. Symptoms include depressed mood, irritability, poor sleep patterns, fatigue, poor concentration, excessive worrying, loss of interest in  activities, anxiety   Diagnosis:  Axis I: Depressive Disorder          Axis II: Deferred   BYNUM,PEGGY, LCSW 04/02/2017

## 2017-04-29 ENCOUNTER — Telehealth (HOSPITAL_COMMUNITY): Payer: Self-pay | Admitting: *Deleted

## 2017-04-29 NOTE — Telephone Encounter (Signed)
LEFT VOICE MESSAGE PROVIDER OUT OF OFFICE 05/06/17.

## 2017-05-06 ENCOUNTER — Ambulatory Visit (HOSPITAL_COMMUNITY): Payer: Self-pay | Admitting: Psychiatry

## 2017-05-14 ENCOUNTER — Ambulatory Visit (HOSPITAL_COMMUNITY): Payer: Self-pay | Admitting: Psychiatry

## 2017-05-20 ENCOUNTER — Ambulatory Visit (INDEPENDENT_AMBULATORY_CARE_PROVIDER_SITE_OTHER): Payer: Medicare Other | Admitting: Psychiatry

## 2017-05-20 ENCOUNTER — Encounter (HOSPITAL_COMMUNITY): Payer: Self-pay | Admitting: Psychiatry

## 2017-05-20 DIAGNOSIS — F329 Major depressive disorder, single episode, unspecified: Secondary | ICD-10-CM | POA: Diagnosis not present

## 2017-05-20 DIAGNOSIS — Z634 Disappearance and death of family member: Secondary | ICD-10-CM

## 2017-05-20 DIAGNOSIS — F32A Depression, unspecified: Secondary | ICD-10-CM

## 2017-05-20 NOTE — Progress Notes (Signed)
Patient:  Yolanda Foley   DOB: 04/02/1941  MR Number:    [id number]  Location: [place]:  [address] [place],  [place], [address]  Start: Wednesday 05/20/2017 1:05 PM  End: Wednesday 05/20/2017 2:00 PM                 Provider/Observer:     Maurice Small, MSW, LCSW   Chief Complaint:     Depression  Reason For Service:    Yolanda Foley is a 76 y.o. female who is seeking services due to experiencing symptoms of depression. She says  it seems as though she is over sensitive when things don't go her way. She reports finding herself snapping and being irritable. She says she and her husband moved back to [place] in 2001 after leaving in [place] for forty years. She says this is when symptoms of  depression seemed to start. She says she kept together while her husband worked when they were living in [place]. She states she also worked 2 jobs and went to school at the same time. During that time, her husband did not seem to appreciate her efforts. She states he would only comment when something was wrong and continues that same pattern. Per patient's report, the situation has become overbearing over the years. She reports additional stress related to her church. 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 Patient reports feeling unappreciated.    Interventions Strategy:  Supportive, psychoeducation, CBT  Participation Level:   Active  Participation Quality:  Appropriate and Sharing      Behavioral Observation:  Casual, Alert, and Appropriate.   Current Psychosocial Factors: Marital stress, phase of life, issues at church  Content of Session:    reviewed symptoms, facilitated expression of thoughts and feelings, praised and reinforced patient's increased socialization and involvement in activity, reviewed the CBT model using examples from patient's life, praise and reinforced patient's use of  assertiveness skills, assisted patient identify thought patterns that inhibit and promote effective assertion assigned patient to complete handout regarding experiences she has with distorted thinking between now and next session, bring to next session  Current Status:    improved mood, decreased irritability, improved sleep patterns, decreased fatigue, decreased worry and anxiety  Suicidal/Homicidal:    No  Patient Progress:    Patient last was seen about 6-7 weeks ago. She expresses sadness regarding death of 76 yo brother-in-law who died in 2023/05/16. She reports managing grief loss in healthy manner. She and husband delayed their trip to [place] until later in the month as a result of brother-in-law's death. Patient reports enjoying her visit and being able to relax. She reports not completing homework but reviewing handouts provided in last session. She reports increased awareness of thought patterns as a result and beginning to make some changes in her thought patterns which has been beneficial. Per patient's report, she has been using healthy alternatives to judgmental thinking. As a result, she has engaged more with people and has had positive results. She also reports recently using assertiveness skills to manage a recent incident at her church regarding serving in a position. Patient is pleased she was able to say no. r  Target Goals:   1. Learn and implement calming techniques to manage stress and anxiety.    2. Identify, challenge, and replace negative thinking patterns that contribute to anxiety and depression with  healthy alternatives.  Last Reviewed:   01/07/2017  Goals Addressed Today:    1,2  Plan:      Return again in 2 weeks.  Impression/Diagnosis:   Patient presents with history of symptoms of depression that began in 2001 when patient moved back to [place] from [place] to take care of my mother who has dementia. She reports no psychiatric hospitalization. Patient  participated in  outpatient therapy at [facility] for about a year about 3 months ago.  Patient reports taking an antidepressant as prescribed by PCP for a brief period but it made her sleepy. Symptoms include depressed mood, irritability, poor sleep patterns, fatigue, poor concentration, excessive worrying, loss of interest in activities, anxiety   Diagnosis:  Axis I: Depressive Disorder          Axis II: Deferred   Gwynne Kemnitz, LCSW 05/20/2017

## 2017-05-27 ENCOUNTER — Ambulatory Visit: Payer: Self-pay | Admitting: Family Medicine

## 2017-06-11 ENCOUNTER — Encounter (HOSPITAL_COMMUNITY): Payer: Self-pay | Admitting: Psychiatry

## 2017-06-11 ENCOUNTER — Ambulatory Visit (INDEPENDENT_AMBULATORY_CARE_PROVIDER_SITE_OTHER): Payer: Medicare Other | Admitting: Psychiatry

## 2017-06-11 DIAGNOSIS — Z636 Dependent relative needing care at home: Secondary | ICD-10-CM

## 2017-06-11 DIAGNOSIS — F32A Depression, unspecified: Secondary | ICD-10-CM

## 2017-06-11 DIAGNOSIS — F329 Major depressive disorder, single episode, unspecified: Secondary | ICD-10-CM | POA: Diagnosis not present

## 2017-06-11 NOTE — Progress Notes (Signed)
Patient:  Yolanda Foley   DOB: [birth year]  MR Number:    [id number]  Location: [facility]:  [address] [place],  [place], [address]  Start: Thursday 06/11/2017 1:18 PM End: Thursday 06/11/2017  2:00 PM                 Provider/Observer:     Maurice Small, MSW, LCSW   Chief Complaint:     Depression  Reason For Service:    Yolanda Foley is a 77 y.o. female who is seeking services due to experiencing symptoms of depression. She says  it seems as though she is over sensitive when things don't go her way. She reports finding herself snapping and being irritable. She says she and her husband moved back to [place] in 2001 after leaving in [place] for forty years. She says this is when symptoms of  depression seemed to start. She says she kept together while her husband worked when they were living in [place]. She states she also worked 2 jobs and went to school at the same time. During that time, her husband did not seem to appreciate her efforts. She states he would only comment when something was wrong and continues that same pattern. Per patient's report, the situation has become overbearing over the years. She reports additional stress related to her church. 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 Patient reports feeling unappreciated.    Interventions Strategy:  Supportive, psychoeducation, CBT  Participation Level:   Active  Participation Quality:  Appropriate and Sharing      Behavioral Observation:  Casual, Alert, and Appropriate.   Current Psychosocial Factors: Marital stress, phase of life, issues at church  Content of Session:    reviewed symptoms, facilitated expression of thoughts and feelings, praised and reinforced patient's use of assertiveness skills,reviewed the CBT model using examples from patient's homework and  life, introduced core beliefs and intermediate beliefs, began to  examine patient's core beliefs and intermediate beliefs, assigned patient to continue journaling thoughts and experiences, bring to next session  Current Status:    improved mood, decreased irritability, improved sleep patterns, decreased fatigue, decreased worry and anxiety  Suicidal/Homicidal:    No  Patient Progress:    Patient last was seen about 2-3  weeks ago. She is continuing to do well. She maintains participation in a variety of activities and has improved efforts to maintain balance. She cites several examples of using assertiveness skills to set and maintain boundaries. Patient reports increased awareness of thinking patterns and trying to intervene to replace unhealthy thinking patterns. She reports less perfectionism and increased acceptance of issues beyond her control. She continues to have thoughts of not being good enough and feeling inadequate but is more aware of this.    Target Goals:   1. Learn and implement calming techniques to manage stress and anxiety.    2. Identify, challenge, and replace negative thinking patterns that contribute to anxiety and depression with healthy alternatives.  Last Reviewed:   01/07/2017  Goals Addressed Today:    1,2  Plan:      Return again in 2 weeks.  Impression/Diagnosis:   Patient presents with history of symptoms of depression that began in 2001 when patient moved back to [place] from [place] to take care of my mother who has dementia. She reports no psychiatric hospitalization. Patient  participated in outpatient  therapy at [facility] for about a year about 3 months ago.  Patient reports taking an antidepressant as prescribed by PCP for a brief period but it made her sleepy. Symptoms include depressed mood, irritability, poor sleep patterns, fatigue, poor concentration, excessive worrying, loss of interest in activities, anxiety   Diagnosis:  Axis I: Depressive Disorder          Axis II: Deferred   Yolanda Hursey, LCSW 06/11/2017

## 2017-06-25 ENCOUNTER — Encounter (HOSPITAL_COMMUNITY): Payer: Self-pay | Admitting: Psychiatry

## 2017-06-25 ENCOUNTER — Ambulatory Visit (INDEPENDENT_AMBULATORY_CARE_PROVIDER_SITE_OTHER): Payer: Medicare Other | Admitting: Psychiatry

## 2017-06-25 DIAGNOSIS — F329 Major depressive disorder, single episode, unspecified: Secondary | ICD-10-CM | POA: Diagnosis not present

## 2017-06-25 DIAGNOSIS — F32A Depression, unspecified: Secondary | ICD-10-CM

## 2017-06-25 NOTE — Progress Notes (Signed)
Patient:  Yolanda Foley   DOB: 08-08-1940  MR Number:    [id number]  Location: [facility]:  [address] [place],  [place], [address]  Start: Thursday 06/25/2017 1:20 PM End: Thursday 06/25/2017  2:08 PM               Provider/Observer:     Maurice Small, MSW, LCSW   Chief Complaint:     Depression  Reason For Service:    Yolanda Foley is a 77 y.o. female who is seeking services due to experiencing symptoms of depression. She says  it seems as though she is over sensitive when things don't go her way. She reports finding herself snapping and being irritable. She says she and her husband moved back to [place] in 2001 after leaving in [place] for forty years. She says this is when symptoms of  depression seemed to start. She says she kept together while her husband worked when they were living in [place]. She states she also worked 2 jobs and went to school at the same time. During that time, her husband did not seem to appreciate her efforts. She states he would only comment when something was wrong and continues that same pattern. Per patient's report, the situation has become overbearing over the years. She reports additional stress related to her church. 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 Patient reports feeling unappreciated.    Interventions Strategy:  Supportive, psychoeducation, CBT  Participation Level:   Active  Participation Quality:  Appropriate and Sharing      Behavioral Observation:  Casual, Alert, and Appropriate.   Current Psychosocial Factors: Marital stress, phase of life, issues at church  Content of Session:    reviewed symptoms, discussed stressors,facilitated expression of thoughts and feelings, examined patient's pattern of interaction with husband, discussed assertive behavior vs aggressive behavior, assisted patient practice ways to improve assertiveness skills using  "I" messages in communication with her husband about her office space and time in her their home, assigned patient to use "I" message with husband to express concerns/feelings about husband using office  Current Status:    improved mood, decreased irritability, improved sleep patterns, decreased fatigue, decreased worry and anxiety  Suicidal/Homicidal:    No  Patient Progress:    Patient last was seen about 2-3  weeks ago. She reports increased frustration and conflict with husband. 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 She reports she usually accommodates what he does regarding  but being angry and frustrated . She reports this is a pattern in other situations in their relationship. She states she sometimes does say something but usually says it in a harsh tone and is accusatory using a lot of "you" statements.   Target Goals:   1. Learn and implement calming techniques to manage stress and anxiety.    2. Identify, challenge, and replace negative thinking patterns that contribute to anxiety and depression with healthy alternatives.  Last Reviewed:   01/07/2017  Goals Addressed Today:    1,2  Plan:      Return again in 2 weeks.  Impression/Diagnosis:   Patient presents with history of symptoms of depression that began in 2001 when patient moved back to [place] from [place] to take care of my mother who has dementia. She reports no psychiatric hospitalization. Patient  participated in outpatient therapy at [facility] for about a year about 3 months ago.  Patient reports taking an antidepressant as prescribed by PCP for a brief period but it made her sleepy. Symptoms include depressed mood, irritability, poor sleep patterns, fatigue, poor concentration, excessive worrying, loss of interest in activities, anxiety   Diagnosis:  Axis I: Depressive Disorder          Axis II: Deferred   Yolanda Troost, LCSW 06/25/2017

## 2017-06-29 ENCOUNTER — Ambulatory Visit: Payer: Self-pay | Admitting: Family Medicine

## 2017-07-09 ENCOUNTER — Encounter (HOSPITAL_COMMUNITY): Payer: Self-pay | Admitting: Psychiatry

## 2017-07-09 ENCOUNTER — Ambulatory Visit (INDEPENDENT_AMBULATORY_CARE_PROVIDER_SITE_OTHER): Payer: Medicare Other | Admitting: Psychiatry

## 2017-07-09 DIAGNOSIS — F329 Major depressive disorder, single episode, unspecified: Secondary | ICD-10-CM

## 2017-07-09 DIAGNOSIS — F32A Depression, unspecified: Secondary | ICD-10-CM

## 2017-07-09 NOTE — Progress Notes (Signed)
Patient:  Yolanda Foley   DOB: 1940/09/17  MR Number:    [id number]  Location: [facility]:  [address] [place],  [place], [address]  Start: Thursday 07/09/2016 1:12 PM End: Thursday 06/25/2017  2:00 PM                      Provider/Observer:     Maurice Small, MSW, LCSW   Chief Complaint:     Depression  Reason For Service:    Yolanda Foley is a 77 y.o. female who is seeking services due to experiencing symptoms of depression. She says  it seems as though she is over sensitive when things don't go her way. She reports finding herself snapping and being irritable. She says she and her husband moved back to [place] in 2001 after leaving in [place] for forty years. She says this is when symptoms of  depression seemed to start. She says she kept together while her husband worked when they were living in [place]. She states she also worked 2 jobs and went to school at the same time. During that time, her husband did not seem to appreciate her efforts. She states he would only comment when something was wrong and continues that same pattern. Per patient's report, the situation has become overbearing over the years. She reports additional stress related to her church. 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 Patient reports feeling unappreciated.    Interventions Strategy:  Supportive, psychoeducation, CBT  Participation Level:   Active  Participation Quality:  Appropriate and Sharing      Behavioral Observation:  Casual, Alert, and Appropriate.   Current Psychosocial Factors: Marital stress, phase of life, issues at church  Content of Session:    reviewed symptoms, discussed homework and difficulties patient had with homework, reviewed and practiced ways to use "I" message with husband to express concerns/feelings about husband using office, assigned patient to implement strategy in conversation with  husband about office, assisted patient identify realistic expectations     Current Status:    improved mood, decreased irritability, improved sleep patterns, decreased fatigue, decreased worry and anxiety  Suicidal/Homicidal:    No  Patient Progress:    Patient last was seen about 2-3  weeks ago. She reports continue frustration and conflict with husband. She reports trying to talk to husband about use of her office but says she didn't use the skills discussed in last session.   Target Goals:   1. Learn and implement calming techniques to manage stress and anxiety.    2. Identify, challenge, and replace negative thinking patterns that contribute to anxiety and depression with healthy alternatives.  Last Reviewed:   01/07/2017  Goals Addressed Today:    1,2  Plan:      Return again in 2 weeks.  Impression/Diagnosis:   Patient presents with history of symptoms of depression that began in 2001 when patient moved back to [place] from [place] to take care of my mother who has dementia. She reports no psychiatric hospitalization. Patient  participated in outpatient therapy at [facility] for about a year about 3 months ago.  Patient reports taking an antidepressant as prescribed by PCP for a brief period but it made her sleepy. Symptoms include depressed mood, irritability, poor sleep patterns, fatigue, poor concentration, excessive worrying, loss of interest in activities, anxiety   Diagnosis:  Axis I: Depressive Disorder          Axis II: Deferred   Rithvik Orcutt, LCSW 07/09/2017

## 2017-07-27 ENCOUNTER — Ambulatory Visit (INDEPENDENT_AMBULATORY_CARE_PROVIDER_SITE_OTHER): Payer: Medicare Other | Admitting: Psychiatry

## 2017-07-27 ENCOUNTER — Encounter (HOSPITAL_COMMUNITY): Payer: Self-pay | Admitting: Psychiatry

## 2017-07-27 DIAGNOSIS — F32A Depression, unspecified: Secondary | ICD-10-CM

## 2017-07-27 DIAGNOSIS — F329 Major depressive disorder, single episode, unspecified: Secondary | ICD-10-CM

## 2017-07-27 NOTE — Progress Notes (Signed)
Patient:  Yolanda Foley   DOB: Aug 31, 1940  MR Number:    [id number]  Location: [facility]:  [address] [place],  [place], [address]  Start: Monday 07/27/2017 2:14 PM          End: Monday 07/27/2017 3:02 PM                      Provider/Observer:     Yolanda Foley, MSW, LCSW   Chief Complaint:     Depression  Reason For Service:    Yolanda Foley is a 77 y.o. female who is seeking services due to experiencing symptoms of depression. She says  it seems as though she is over sensitive when things don't go her way. She reports finding herself snapping and being irritable. She says she and her husband moved back to [place] in 2001 after leaving in [place] for forty years. She says this is when symptoms of  depression seemed to start. She says she kept together while her husband worked when they were living in [place]. She states she also worked 2 jobs and went to school at the same time. During that time, her husband did not seem to appreciate her efforts. She states he would only comment when something was wrong and continues that same pattern. Per patient's report, the situation has become overbearing over the years. She reports additional stress related to her church. 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 Patient reports feeling unappreciated.    Interventions Strategy:  Supportive, psychoeducation, CBT  Participation Level:   Active  Participation Quality:  Appropriate and Sharing      Behavioral Observation:  Casual, Alert, and Appropriate.   Current Psychosocial Factors: Marital stress, phase of life, issues at church  Content of Session:    reviewed symptoms, discussed homework and difficulties patient had with homework, assisted patient with problem solving and explored options regarding assisting husband seek medical attention, discussed ways patient could express concerns to husband's  provider,     Current Status:    improved mood, decreased irritability, improved sleep patterns, decreased fatigue, decreased worry and anxiety  Suicidal/Homicidal:    No  Patient Progress:    Patient last was seen about 2-3  weeks ago. She reports continue frustration and conflict with husband. She reports trying use assertive communication with husband but says she is not sure about his ability to understand and remember. She shares that his cognitive functioning seems to have been declining since last summer. He was seen at the [facility] several months ago and a neurological exam was recommended. However, this has not been scheduled per patient's report. She also expresses worry about husband driving.   Target Goals:   1. Learn and implement calming techniques to manage stress and anxiety.    2. Identify, challenge, and replace negative thinking patterns that contribute to anxiety and depression with healthy alternatives.  Last Reviewed:   01/07/2017  Goals Addressed Today:    1,2  Plan:      Return again in 2 weeks.  Impression/Diagnosis:   Patient presents with history of symptoms of depression that began in 2001 when patient moved back to [place] from [place] to take care of my mother who has dementia. She reports no psychiatric hospitalization. Patient  participated in outpatient therapy at [facility] for about a year about 3 months ago.  Patient reports taking an antidepressant as prescribed by PCP for a brief period but it made her sleepy. Symptoms include depressed mood, irritability, poor sleep patterns, fatigue, poor concentration, excessive worrying, loss of interest in activities, anxiety   Diagnosis:  Axis I: Depressive Disorder          Axis II: Deferred   Yolanda Deman, LCSW 07/27/2017

## 2017-08-10 ENCOUNTER — Encounter (HOSPITAL_COMMUNITY): Payer: Self-pay | Admitting: Psychiatry

## 2017-08-10 ENCOUNTER — Ambulatory Visit (INDEPENDENT_AMBULATORY_CARE_PROVIDER_SITE_OTHER): Payer: Medicare Other | Admitting: Psychiatry

## 2017-08-10 DIAGNOSIS — F32A Depression, unspecified: Secondary | ICD-10-CM

## 2017-08-10 DIAGNOSIS — F329 Major depressive disorder, single episode, unspecified: Secondary | ICD-10-CM

## 2017-08-10 NOTE — Progress Notes (Signed)
Patient:  Yolanda Foley   DOB: 09/04/1940  MR Number:    [id number]  Location: [facility]:  [address] [place],  [place], [address]  Start: Monday 08/10/2017 1:14 PM          End: Monday 08/10/2017 1:56 PM                      Provider/Observer:     Maurice Small, MSW, LCSW   Chief Complaint:     Depression  Reason For Service:    Yolanda Foley is a 77 y.o. female who is seeking services due to experiencing symptoms of depression. She says  it seems as though she is over sensitive when things don't go her way. She reports finding herself snapping and being irritable. She says she and her husband moved back to [place] in 2001 after leaving in [place] for forty years. She says this is when symptoms of  depression seemed to start. She says she kept together while her husband worked when they were living in [place]. She states she also worked 2 jobs and went to school at the same time. During that time, her husband did not seem to appreciate her efforts. She states he would only comment when something was wrong and continues that same pattern. Per patient's report, the situation has become overbearing over the years. She reports additional stress related to her church. 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 Patient reports feeling unappreciated.    Interventions Strategy:  Supportive, psychoeducation, CBT  Participation Level:   Active  Participation Quality:  Appropriate and Sharing      Behavioral Observation:  Casual, Alert, and Appropriate.   Current Psychosocial Factors: Marital stress, phase of life, issues at church  Content of Session:    reviewed symptoms, discussed stressors, assisted patient identify ways to balance responsibilities/self-care/, assisted patient develop list of responsibilities, began to help patient identify ways to prioritize, assisted patient identify ways to solicit  help and simplify tasks, assisted patient identify/challenge/and replace perfectionist thoughts regarding ways of doing tasks with healthy alternatives, Current Status:    improved mood, decreased irritability, improved sleep patterns, decreased fatigue, decreased worry and anxiety  Suicidal/Homicidal:    No  Patient Progress:    Patient last was seen about 2-3  weeks ago. She reports decreased stress about husband. She has talked with someone who has recommended neurologist in [place]. [facility] Patient reports fatigue and feeling overwhelmed with her responsibilities.   Target Goals:   1. Learn and implement calming techniques to manage stress and anxiety.    2. Identify, challenge, and replace negative thinking patterns that contribute to anxiety and depression with healthy alternatives.  Last Reviewed:   01/07/2017  Goals Addressed Today:    1,2  Plan:      Return again in 2 weeks.  Impression/Diagnosis:   Patient presents with history of symptoms of depression that began in 2001 when patient moved back to [place] from [place] to take care of my mother who has dementia. She reports no psychiatric hospitalization. Patient  participated in outpatient therapy at [facility] for about a year about 3 months ago.  Patient reports taking an antidepressant as prescribed by PCP for a brief period but it made her sleepy. Symptoms include depressed mood,  irritability, poor sleep patterns, fatigue, poor concentration, excessive worrying, loss of interest in activities, anxiety   Diagnosis:  Axis I: Depressive Disorder          Axis II: Deferred   Ryu Cerreta, LCSW 08/10/2017

## 2017-08-24 ENCOUNTER — Encounter (HOSPITAL_COMMUNITY): Payer: Self-pay | Admitting: Psychiatry

## 2017-08-24 ENCOUNTER — Telehealth: Payer: Self-pay

## 2017-08-24 ENCOUNTER — Ambulatory Visit (INDEPENDENT_AMBULATORY_CARE_PROVIDER_SITE_OTHER): Payer: Medicare Other | Admitting: Psychiatry

## 2017-08-24 DIAGNOSIS — E7849 Other hyperlipidemia: Secondary | ICD-10-CM

## 2017-08-24 DIAGNOSIS — R7401 Elevation of levels of liver transaminase levels: Secondary | ICD-10-CM

## 2017-08-24 DIAGNOSIS — R7303 Prediabetes: Secondary | ICD-10-CM

## 2017-08-24 DIAGNOSIS — R740 Nonspecific elevation of levels of transaminase and lactic acid dehydrogenase [LDH]: Secondary | ICD-10-CM

## 2017-08-24 DIAGNOSIS — F329 Major depressive disorder, single episode, unspecified: Secondary | ICD-10-CM

## 2017-08-24 DIAGNOSIS — I10 Essential (primary) hypertension: Secondary | ICD-10-CM | POA: Diagnosis not present

## 2017-08-24 DIAGNOSIS — F32A Depression, unspecified: Secondary | ICD-10-CM

## 2017-08-24 LAB — CBC
HCT: 31.3 % — ABNORMAL LOW (ref 35.0–45.0)
HEMOGLOBIN: 10.3 g/dL — AB (ref 11.7–15.5)
MCH: 29 pg (ref 27.0–33.0)
MCHC: 32.9 g/dL (ref 32.0–36.0)
MCV: 88.2 fL (ref 80.0–100.0)
MPV: 12.9 fL — ABNORMAL HIGH (ref 7.5–12.5)
Platelets: 130 10*3/uL — ABNORMAL LOW (ref 140–400)
RBC: 3.55 10*6/uL — ABNORMAL LOW (ref 3.80–5.10)
RDW: 12.9 % (ref 11.0–15.0)
WBC: 4.4 10*3/uL (ref 3.8–10.8)

## 2017-08-24 LAB — COMPLETE METABOLIC PANEL WITHOUT GFR
AG Ratio: 1 (calc) (ref 1.0–2.5)
ALT: 19 U/L (ref 6–29)
AST: 33 U/L (ref 10–35)
Albumin: 4 g/dL (ref 3.6–5.1)
Alkaline phosphatase (APISO): 168 U/L — ABNORMAL HIGH (ref 33–130)
BUN: 11 mg/dL (ref 7–25)
CO2: 30 mmol/L (ref 20–32)
Calcium: 8.9 mg/dL (ref 8.6–10.4)
Chloride: 104 mmol/L (ref 98–110)
Creat: 0.76 mg/dL (ref 0.60–0.93)
GFR, Est African American: 87 mL/min/{1.73_m2}
GFR, Est Non African American: 75 mL/min/{1.73_m2}
Globulin: 4.1 g/dL — ABNORMAL HIGH (ref 1.9–3.7)
Glucose, Bld: 85 mg/dL (ref 65–139)
Potassium: 4.2 mmol/L (ref 3.5–5.3)
Sodium: 139 mmol/L (ref 135–146)
Total Bilirubin: 0.4 mg/dL (ref 0.2–1.2)
Total Protein: 8.1 g/dL (ref 6.1–8.1)

## 2017-08-24 LAB — TSH: TSH: 0.82 mIU/L (ref 0.40–4.50)

## 2017-08-24 NOTE — Telephone Encounter (Signed)
Patient labs ordered.  

## 2017-08-24 NOTE — Progress Notes (Signed)
Patient:  Yolanda Foley   DOB: 1940-12-15  MR Number:    [id number]  Location: [facility]:  [address] [place],  [place], [address]  Start: Monday 08/24/2017 1:10 PM          End: Monday 08/24/2017 2:00 PM                       Provider/Observer:     Maurice Small, MSW, LCSW   Chief Complaint:     Depression  Reason For Service:    Yolanda Foley is a 77 y.o. female who is seeking services due to experiencing symptoms of depression. She says  it seems as though she is over sensitive when things don't go her way. She reports finding herself snapping and being irritable. She says she and her husband moved back to [place] in 2001 after leaving in [place] for forty years. She says this is when symptoms of  depression seemed to start. She says she kept together while her husband worked when they were living in [place]. She states she also worked 2 jobs and went to school at the same time. During that time, her husband did not seem to appreciate her efforts. She states he would only comment when something was wrong and continues that same pattern. Per patient's report, the situation has become overbearing over the years. She reports additional stress related to her church. 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 Patient reports feeling unappreciated.    Interventions Strategy:  Supportive, psychoeducation, CBT  Participation Level:   Active  Participation Quality:  Appropriate and Sharing      Behavioral Observation:  Casual, Alert, and Appropriate.   Current Psychosocial Factors: Marital stress, phase of life, issues at church  Content of Session:    reviewed symptoms, praised and reinforced patient completing homework, discussed effects of developing list of responsibilities and schedule, assisted patient identify ways to prioritize based on her values, assisted patient identify ways to solicit help  and simplify tasks, assisted patient identify/challenge/and replace perfectionist thoughts regarding ways of doing tasks with healthy alternatives, assigned patient to implement strategies discussed in session including implementing schedule  Current Status:    improved mood, decreased irritability, improved sleep patterns, decreased fatigue, decreased worry and anxiety  Suicidal/Homicidal:    No  Patient Progress:    Patient last was seen about 2 weeks ago. She reports continuing to feel better. She developed list of responsibilities and schedule. Per her report, she can see and think more clearly about her responsibilities.  She reports feeling less overwhelmed and expresses optimism regarding managing her responsibilities and creating balance. She continues to have some issues with prioritization and admits pressuring self to perform tasks.   Target Goals:   1. Learn and implement calming techniques to manage stress and anxiety.    2. Identify, challenge, and replace negative thinking patterns that contribute to anxiety and depression with healthy alternatives.  Last Reviewed:   01/07/2017  Goals Addressed Today:    1,2  Plan:      Return again in 2 weeks.  Impression/Diagnosis:   Patient presents with history of symptoms of depression that began in 2001 when patient moved back to [place] from [place] to take care of my mother who has dementia. She reports no psychiatric hospitalization. Patient  participated in outpatient therapy at [facility] and  [facility] for about a year about 3 months ago.  Patient reports taking an antidepressant as prescribed by PCP for a brief period but it made her sleepy. Symptoms include depressed mood, irritability, poor sleep patterns, fatigue, poor concentration, excessive worrying, loss of interest in activities, anxiety   Diagnosis:  Axis I: Depressive Disorder          Axis II: Deferred   Yolanda Achee, LCSW 08/24/2017

## 2017-08-25 ENCOUNTER — Encounter: Payer: Self-pay | Admitting: Family Medicine

## 2017-08-25 ENCOUNTER — Ambulatory Visit (INDEPENDENT_AMBULATORY_CARE_PROVIDER_SITE_OTHER): Payer: Medicare Other | Admitting: Family Medicine

## 2017-08-25 VITALS — BP 150/84 | HR 84 | Resp 16 | Ht 65.0 in | Wt 165.0 lb

## 2017-08-25 DIAGNOSIS — R7303 Prediabetes: Secondary | ICD-10-CM

## 2017-08-25 DIAGNOSIS — I10 Essential (primary) hypertension: Secondary | ICD-10-CM

## 2017-08-25 DIAGNOSIS — R454 Irritability and anger: Secondary | ICD-10-CM

## 2017-08-25 DIAGNOSIS — K743 Primary biliary cirrhosis: Secondary | ICD-10-CM | POA: Diagnosis not present

## 2017-08-25 DIAGNOSIS — E782 Mixed hyperlipidemia: Secondary | ICD-10-CM | POA: Diagnosis not present

## 2017-08-25 LAB — HEMOGLOBIN A1C
Hgb A1c MFr Bld: 5.6 % of total Hgb (ref <5.7)
MEAN PLASMA GLUCOSE: 114 (calc)
eAG (mmol/L): 6.3 (calc)

## 2017-08-25 NOTE — Progress Notes (Signed)
ELLEAN FIRMAN     MRN: [id number]      DOB: 03/17/1941   HPI Ms. Oakland is here for follow up and re-evaluation of chronic medical conditions, medication management and review of any available recent lab and radiology data.  Preventive health is updated, specifically  Cancer screening and Immunization.   .Therapy is doing very well for her and she intends to continue this  The PT denies any adverse reactions and not exercising as she should   ROS Denies recent fever or chills. Denies sinus pressure, nasal congestion, ear pain or sore throat. Denies chest congestion, productive cough or wheezing. Denies chest pains, palpitations and leg swelling Denies abdominal pain, nausea, vomiting,diarrhea or constipation.   Denies dysuria, frequency, hesitancy or incontinence. . Denies headaches, seizures, numbness, or tingling. Denies  Uncontrolled depression, anxiety or insomnia. Denies skin break down or rash.   PE  BP (!) 150/84   Pulse 84   Resp 16   Ht 5\' 5"  (1.651 m)   Wt 165 lb (74.8 kg)   SpO2 98%   BMI 27.46 kg/m   Patient alert and oriented and in no cardiopulmonary distress.  HEENT: No facial asymmetry, EOMI,   oropharynx pink and moist.  Neck supple no JVD, no mass.  Chest: Clear to auscultation bilaterally.  CVS: S1, S2 no murmurs, no S3.Regular rate.  ABD: Soft non tender.   Ext: No edema  MS: Adequate though reduced  ROM spine, shoulders, hips and knees.  Skin: Intact, no ulcerations or rash noted.  Psych: Good eye contact, normal affect. Memory intact not anxious or depressed appearing.  CNS: CN 2-12 intact, power,  normal throughout.no focal deficits noted.   Assessment & Plan  Essential hypertension Improved  Control thugh still sub optimal No med change DASH diet and commitment to daily physical activity for a minimum of 30 minutes discussed and encouraged, as a part of hypertension management. The importance of attaining a healthy weight is also  discussed.  BP/Weight 08/25/2017 03/24/2017 03/02/2017 02/12/2017 12/10/2016 11/05/2016 4/62/7035  Systolic BP 009 381 829 937 169 - -  Diastolic BP 84 82 82 90 80 - -  Wt. (Lbs) 165 160 159.3 158.5 155 150 163  BMI 27.46 26.63 26.51 25.58 25.02 24.21 27.12        Hyperlipemia Hyperlipidemia:Low fat diet discussed and encouraged.   Lipid Panel  Lab Results  Component Value Date   CHOL 182 09/17/2016   HDL 63 09/17/2016   LDLCALC 105 (H) 09/17/2016   TRIG 70 09/17/2016   CHOLHDL 2.9 09/17/2016   Updated lab needed at/ before next visit.     Irritable behavior Improved with therapy  Prediabetes Patient educated about the importance of limiting  Carbohydrate intake , the need to commit to daily physical activity for a minimum of 30 minutes , and to commit weight loss. The fact that changes in all these areas will reduce or eliminate all together the development of diabetes is stressed.  Updated lab needed at/ before next visit.   Diabetic Labs Latest Ref Rng & Units 08/24/2017 02/06/2017 09/17/2016 01/10/2015 08/28/2014  HbA1c <5.7 % of total Hgb 5.6 - 5.5 5.9(H) 5.9(H)  Chol <200 mg/dL - - 182 - 185  HDL >50 mg/dL - - 63 - 57  Calc LDL <100 mg/dL - - 105(H) - 115(H)  Triglycerides <150 mg/dL - - 70 - 63  Creatinine 0.60 - 0.93 mg/dL 0.76 0.77 0.77 0.65 0.66   BP/Weight 08/25/2017 03/24/2017 03/02/2017  02/12/2017 12/10/2016 11/05/2016 09/25/1038  Systolic BP 459 136 859 923 414 - -  Diastolic BP 84 82 82 90 80 - -  Wt. (Lbs) 165 160 159.3 158.5 155 150 163  BMI 27.46 26.63 26.51 25.58 25.02 24.21 27.12   No flowsheet data found.    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

## 2017-08-25 NOTE — Assessment & Plan Note (Signed)
Improved with treatment, she is to continue the medication through GI

## 2017-08-25 NOTE — Assessment & Plan Note (Signed)
Hyperlipidemia:Low fat diet discussed and encouraged.   Lipid Panel  Lab Results  Component Value Date   CHOL 182 09/17/2016   HDL 63 09/17/2016   LDLCALC 105 (H) 09/17/2016   TRIG 70 09/17/2016   CHOLHDL 2.9 09/17/2016     Updated lab needed at/ before next visit.  

## 2017-08-25 NOTE — Assessment & Plan Note (Signed)
Improved  Control thugh still sub optimal No med change DASH diet and commitment to daily physical activity for a minimum of 30 minutes discussed and encouraged, as a part of hypertension management. The importance of attaining a healthy weight is also discussed.  BP/Weight 08/25/2017 03/24/2017 03/02/2017 02/12/2017 12/10/2016 11/05/2016 7/35/6701  Systolic BP 410 301 314 388 875 - -  Diastolic BP 84 82 82 90 80 - -  Wt. (Lbs) 165 160 159.3 158.5 155 150 163  BMI 27.46 26.63 26.51 25.58 25.02 24.21 27.12

## 2017-08-25 NOTE — Assessment & Plan Note (Signed)
Improved with therapy 

## 2017-08-25 NOTE — Assessment & Plan Note (Signed)
Patient educated about the importance of limiting  Carbohydrate intake , the need to commit to daily physical activity for a minimum of 30 minutes , and to commit weight loss. The fact that changes in all these areas will reduce or eliminate all together the development of diabetes is stressed.  Updated lab needed at/ before next visit.   Diabetic Labs Latest Ref Rng & Units 08/24/2017 02/06/2017 09/17/2016 01/10/2015 08/28/2014  HbA1c <5.7 % of total Hgb 5.6 - 5.5 5.9(H) 5.9(H)  Chol <200 mg/dL - - 182 - 185  HDL >50 mg/dL - - 63 - 57  Calc LDL <100 mg/dL - - 105(H) - 115(H)  Triglycerides <150 mg/dL - - 70 - 63  Creatinine 0.60 - 0.93 mg/dL 0.76 0.77 0.77 0.65 0.66   BP/Weight 08/25/2017 03/24/2017 03/02/2017 02/12/2017 12/10/2016 11/05/2016 3/66/2947  Systolic BP 654 650 354 656 812 - -  Diastolic BP 84 82 82 90 80 - -  Wt. (Lbs) 165 160 159.3 158.5 155 150 163  BMI 27.46 26.63 26.51 25.58 25.02 24.21 27.12   No flowsheet data found.

## 2017-08-25 NOTE — Patient Instructions (Addendum)
Wellness with nurse past due please schedule  MD follow up with MD in 6.5 months, call if you need me before  You are being referred to cardiology for evaluation of exertional fatigue and palpitations, since you have a 30 year h/o hypertension, blood pressure is still higher   Labs look much better, stay on same meds  Please start taking medication for allergies daily, this will reduce light headedness and sinus pressure  Aim for 6 hours of sleep each night

## 2017-09-22 ENCOUNTER — Ambulatory Visit (INDEPENDENT_AMBULATORY_CARE_PROVIDER_SITE_OTHER): Payer: Medicare Other | Admitting: Psychiatry

## 2017-09-22 ENCOUNTER — Encounter (HOSPITAL_COMMUNITY): Payer: Self-pay | Admitting: Psychiatry

## 2017-09-22 DIAGNOSIS — F329 Major depressive disorder, single episode, unspecified: Secondary | ICD-10-CM

## 2017-09-22 DIAGNOSIS — F32A Depression, unspecified: Secondary | ICD-10-CM

## 2017-09-22 NOTE — Progress Notes (Signed)
Patient:  Yolanda Foley   DOB: May 28, 1940  MR Number:    [id number]  Location: [place]:  [address] [place],  [place], [address]  Start: Monday 09/22/2017 1:16 PM        End: Monday 09/22/2017 2:03 PM                    Provider/Observer:     Maurice Small, MSW, LCSW   Chief Complaint:     Depression  Reason For Service:    Yolanda Foley is a 77 y.o. female who is seeking services due to experiencing symptoms of depression. She says  it seems as though she is over sensitive when things don't go her way. She reports finding herself snapping and being irritable. She says she and her husband moved back to [place] in 2001 after leaving in [place] for forty years. She says this is when symptoms of  depression seemed to start. She says she kept together while her husband worked when they were living in [place]. She states she also worked 2 jobs and went to school at the same time. During that time, her husband did not seem to appreciate her efforts. She states he would only comment when something was wrong and continues that same pattern. Per patient's report, the situation has become overbearing over the years. She reports additional stress related to her church. 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 Patient reports feeling unappreciated.    Interventions Strategy:  Supportive, psychoeducation, CBT  Participation Level:   Active  Participation Quality:  Appropriate and Sharing      Behavioral Observation:  Casual, Alert, and Appropriate.   Current Psychosocial Factors: Marital stress, phase of life, issues at church  Content of Session:    reviewed symptoms, reinforced patient's efforts completing homework, assisted patient identify thoughts and behaviors that  Inhibited her ability to implement schedule, discussed ways to improve sleep hygiene, assisted patient determine bedtime ritual bed  time, assisted patient identify/challenge/and replace perfectionist thoughts regarding completing tasks with healthy alternatives, assigned patient to implement strategies discussed in sessionfacilitated expression of thoughts and feelings regarding the death of one of her friends, validated feelings,   Current Status:    improved mood, decreased irritability, improved sleep patterns, decreased fatigue, decreased worry and anxiety  Suicidal/Homicidal:    No  Patient Progress:    Patient last was seen about 4 weeks ago. She reports increased fatigue and difficulty implementing schedule. She reports tendency to stay up late and get up early. She says she has to push self to accomplish tasks. She expresses frustration that she then needs to take a nap later in the day.   Target Goals:   1. Learn and implement calming techniques to manage stress and anxiety.    2. Identify, challenge, and replace negative thinking patterns that contribute to anxiety and depression with healthy alternatives.  Last Reviewed:   01/07/2017  Goals Addressed Today:    1,2  Plan:      Return again in 2 weeks.  Impression/Diagnosis:   Patient presents with history of symptoms of depression that began in 2001 when patient moved back to [place] from [place] to take care of my mother who has dementia. She reports no psychiatric hospitalization. Patient  participated in outpatient therapy at [facility] for about a year about 3 months ago.  Patient reports taking an antidepressant as prescribed by PCP for a brief period but it made her sleepy. Symptoms include depressed mood, irritability, poor sleep patterns, fatigue, poor concentration, excessive worrying, loss of interest in activities, anxiety   Diagnosis:  Axis I: Depressive Disorder          Axis II: Deferred   BYNUM,PEGGY, LCSW 09/22/2017

## 2017-10-05 ENCOUNTER — Ambulatory Visit (INDEPENDENT_AMBULATORY_CARE_PROVIDER_SITE_OTHER): Payer: Medicare Other | Admitting: Psychiatry

## 2017-10-05 ENCOUNTER — Encounter (HOSPITAL_COMMUNITY): Payer: Self-pay | Admitting: Psychiatry

## 2017-10-05 DIAGNOSIS — F329 Major depressive disorder, single episode, unspecified: Secondary | ICD-10-CM

## 2017-10-05 DIAGNOSIS — F32A Depression, unspecified: Secondary | ICD-10-CM

## 2017-10-05 NOTE — Progress Notes (Signed)
Patient:  Yolanda Foley   DOB: 1940/09/06  MR Number:    [id number]  Location: [place]:  [address] [place],  [place], [address]  Start: Monday 10/05/2017 1:25 PM       End: Monday 10/05/2017 1:56 PM                            Provider/Observer:     Maurice Small, MSW, LCSW   Chief Complaint:     Depression  Reason For Service:    Yolanda Foley is a 77 y.o. female who is seeking services due to experiencing symptoms of depression. She says  it seems as though she is over sensitive when things don't go her way. She reports finding herself snapping and being irritable. She says she and her husband moved back to [place] in 2001 after leaving in [place] for forty years. She says this is when symptoms of  depression seemed to start. She says she kept together while her husband worked when they were living in [place]. She states she also worked 2 jobs and went to school at the same time. During that time, her husband did not seem to appreciate her efforts. She states he would only comment when something was wrong and continues that same pattern. Per patient's report, the situation has become overbearing over the years. She reports additional stress related to her church. 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 Patient reports feeling unappreciated.    Interventions Strategy:  Supportive, psychoeducation, CBT  Participation Level:   Active  Participation Quality:  Appropriate and Sharing      Behavioral Observation:  Casual, Alert, and Appropriate.   Current Psychosocial Factors: Marital stress, phase of life, issues at church  Content of Session:     Discussed patient's efforts to complete homework, identified obstacles to completing homework, assisted patient with problem solving in identifying ways to create a conducive environment for sleeping, assisted patient identify/challenge/and replace  unhelpful thoughts about creating conducive environment with helpful thoughts, discussed and practiced ways to have assertive communication with husband about patient working on sleep hygiene  Current Status:    improved mood, decreased irritability, improved sleep patterns, decreased fatigue, decreased worry and anxiety  Suicidal/Homicidal:    No  Patient Progress:    Patient last was seen about 2 weeks ago. She reports continued fatigue. Per patient's report, she has tried to go to bed earlier and is able to fall asleep. However, she is awakened by husband who goes to bed later than she does. She continues to express frustration as she says she has to continued to push self to accomplish tasks. She expresses frustration and guilt regarding talking to husband about options as she states she has a pattern of giving in to him and others have made comments to her she caused him to be the way he is.   Target Goals:   1. Learn and implement calming techniques to manage stress and anxiety.    2. Identify, challenge, and replace negative thinking patterns that contribute to anxiety and depression with healthy alternatives.  Last Reviewed:   01/07/2017  Goals Addressed Today:    1,2  Plan:      Return again in 2 weeks.  Impression/Diagnosis:   Patient presents with history of symptoms of depression that began in 2001  when patient moved back to [place] from [place] to take care of my mother who has dementia. She reports no psychiatric hospitalization. Patient  participated in outpatient therapy at [facility] for about a year about 3 months ago.  Patient reports taking an antidepressant as prescribed by PCP for a brief period but it made her sleepy. Symptoms include depressed mood, irritability, poor sleep patterns, fatigue, poor concentration, excessive worrying, loss of interest in activities, anxiety   Diagnosis:  Axis I: Depressive Disorder          Axis II: Deferred   Shanah Guimaraes, LCSW 10/05/2017

## 2017-10-19 ENCOUNTER — Ambulatory Visit (HOSPITAL_COMMUNITY): Payer: Medicare Other | Admitting: Psychiatry

## 2017-11-02 ENCOUNTER — Ambulatory Visit (HOSPITAL_COMMUNITY): Payer: Medicare Other | Admitting: Psychiatry

## 2017-12-07 ENCOUNTER — Ambulatory Visit: Payer: Self-pay

## 2017-12-14 ENCOUNTER — Encounter: Payer: Self-pay | Admitting: Family Medicine

## 2018-02-18 ENCOUNTER — Other Ambulatory Visit: Payer: Self-pay | Admitting: Family Medicine

## 2018-02-18 ENCOUNTER — Ambulatory Visit (INDEPENDENT_AMBULATORY_CARE_PROVIDER_SITE_OTHER): Payer: Medicare Other

## 2018-02-18 VITALS — BP 159/91 | HR 77 | Resp 12 | Ht 65.0 in | Wt 165.0 lb

## 2018-02-18 DIAGNOSIS — Z0000 Encounter for general adult medical examination without abnormal findings: Secondary | ICD-10-CM | POA: Diagnosis not present

## 2018-02-18 NOTE — Patient Instructions (Signed)
Yolanda Foley , Thank you for taking time to come for your Medicare Wellness Visit. I appreciate your ongoing commitment to your health goals. Please review the following plan we discussed and let me know if I can assist you in the future.   Screening recommendations/referrals: Colonoscopy: up to date  Mammogram: need to schedule  Bone Density: Postponed  Recommended yearly ophthalmology/optometry visit for glaucoma screening and checkup Recommended yearly dental visit for hygiene and checkup  Vaccinations: Influenza vaccine: refused  Pneumococcal vaccine: up to date  Tdap vaccine: up to date  Shingles vaccine: postpone     Advanced directives: information given   Conditions/risks identified: stress (situational), impaired vision   Next appointment: wellness in one year    Preventive Care 20 Years and Older, Female Preventive care refers to lifestyle choices and visits with your health care provider that can promote health and wellness. What does preventive care include?  A yearly physical exam. This is also called an annual well check.  Dental exams once or twice a year.  Routine eye exams. Ask your health care provider how often you should have your eyes checked.  Personal lifestyle choices, including:  Daily care of your teeth and gums.  Regular physical activity.  Eating a healthy diet.  Avoiding tobacco and drug use.  Limiting alcohol use.  Practicing safe sex.  Taking low-dose aspirin every day.  Taking vitamin and mineral supplements as recommended by your health care provider. What happens during an annual well check? The services and screenings done by your health care provider during your annual well check will depend on your age, overall health, lifestyle risk factors, and family history of disease. Counseling  Your health care provider may ask you questions about your:  Alcohol use.  Tobacco use.  Drug use.  Emotional well-being.  Home and  relationship well-being.  Sexual activity.  Eating habits.  History of falls.  Memory and ability to understand (cognition).  Work and work Statistician.  Reproductive health. Screening  You may have the following tests or measurements:  Height, weight, and BMI.  Blood pressure.  Lipid and cholesterol levels. These may be checked every 5 years, or more frequently if you are over 74 years old.  Skin check.  Lung cancer screening. You may have this screening every year starting at age 18 if you have a 30-pack-year history of smoking and currently smoke or have quit within the past 15 years.  Fecal occult blood test (FOBT) of the stool. You may have this test every year starting at age 46.  Flexible sigmoidoscopy or colonoscopy. You may have a sigmoidoscopy every 5 years or a colonoscopy every 10 years starting at age 23.  Hepatitis C blood test.  Hepatitis B blood test.  Sexually transmitted disease (STD) testing.  Diabetes screening. This is done by checking your blood sugar (glucose) after you have not eaten for a while (fasting). You may have this done every 1-3 years.  Bone density scan. This is done to screen for osteoporosis. You may have this done starting at age 27.  Mammogram. This may be done every 1-2 years. Talk to your health care provider about how often you should have regular mammograms. Talk with your health care provider about your test results, treatment options, and if necessary, the need for more tests. Vaccines  Your health care provider may recommend certain vaccines, such as:  Influenza vaccine. This is recommended every year.  Tetanus, diphtheria, and acellular pertussis (Tdap, Td) vaccine. You  may need a Td booster every 10 years.  Zoster vaccine. You may need this after age 50.  Pneumococcal 13-valent conjugate (PCV13) vaccine. One dose is recommended after age 56.  Pneumococcal polysaccharide (PPSV23) vaccine. One dose is recommended after  age 33. Talk to your health care provider about which screenings and vaccines you need and how often you need them. This information is not intended to replace advice given to you by your health care provider. Make sure you discuss any questions you have with your health care provider. Document Released: 05/11/2015 Document Revised: 01/02/2016 Document Reviewed: 02/13/2015 Elsevier Interactive Patient Education  2017 [place] Prevention in the Home Falls can cause injuries. They can happen to people of all ages. There are many things you can do to make your home safe and to help prevent falls. What can I do on the outside of my home?  Regularly fix the edges of walkways and driveways and fix any cracks.  Remove anything that might make you trip as you walk through a door, such as a raised step or threshold.  Trim any bushes or trees on the path to your home.  Use bright outdoor lighting.  Clear any walking paths of anything that might make someone trip, such as rocks or tools.  Regularly check to see if handrails are loose or broken. Make sure that both sides of any steps have handrails.  Any raised decks and porches should have guardrails on the edges.  Have any leaves, snow, or ice cleared regularly.  Use sand or salt on walking paths during winter.  Clean up any spills in your garage right away. This includes oil or grease spills. What can I do in the bathroom?  Use night lights.  Install grab bars by the toilet and in the tub and shower. Do not use towel bars as grab bars.  Use non-skid mats or decals in the tub or shower.  If you need to sit down in the shower, use a plastic, non-slip stool.  Keep the floor dry. Clean up any water that spills on the floor as soon as it happens.  Remove soap buildup in the tub or shower regularly.  Attach bath mats securely with double-sided non-slip rug tape.  Do not have throw rugs and other things on the floor that can  make you trip. What can I do in the bedroom?  Use night lights.  Make sure that you have a light by your bed that is easy to reach.  Do not use any sheets or blankets that are too big for your bed. They should not hang down onto the floor.  Have a firm chair that has side arms. You can use this for support while you get dressed.  Do not have throw rugs and other things on the floor that can make you trip. What can I do in the kitchen?  Clean up any spills right away.  Avoid walking on wet floors.  Keep items that you use a lot in easy-to-reach places.  If you need to reach something above you, use a strong step stool that has a grab bar.  Keep electrical cords out of the way.  Do not use floor polish or wax that makes floors slippery. If you must use wax, use non-skid floor wax.  Do not have throw rugs and other things on the floor that can make you trip. What can I do with my stairs?  Do not leave any items  on the stairs.  Make sure that there are handrails on both sides of the stairs and use them. Fix handrails that are broken or loose. Make sure that handrails are as long as the stairways.  Check any carpeting to make sure that it is firmly attached to the stairs. Fix any carpet that is loose or worn.  Avoid having throw rugs at the top or bottom of the stairs. If you do have throw rugs, attach them to the floor with carpet tape.  Make sure that you have a light switch at the top of the stairs and the bottom of the stairs. If you do not have them, ask someone to add them for you. What else can I do to help prevent falls?  Wear shoes that:  Do not have high heels.  Have rubber bottoms.  Are comfortable and fit you well.  Are closed at the toe. Do not wear sandals.  If you use a stepladder:  Make sure that it is fully opened. Do not climb a closed stepladder.  Make sure that both sides of the stepladder are locked into place.  Ask someone to hold it for you,  if possible.  Clearly mark and make sure that you can see:  Any grab bars or handrails.  First and last steps.  Where the edge of each step is.  Use tools that help you move around (mobility aids) if they are needed. These include:  Canes.  Walkers.  Scooters.  Crutches.  Turn on the lights when you go into a dark area. Replace any light bulbs as soon as they burn out.  Set up your furniture so you have a clear path. Avoid moving your furniture around.  If any of your floors are uneven, fix them.  If there are any pets around you, be aware of where they are.  Review your medicines with your doctor. Some medicines can make you feel dizzy. This can increase your chance of falling. Ask your doctor what other things that you can do to help prevent falls. This information is not intended to replace advice given to you by your health care provider. Make sure you discuss any questions you have with your health care provider. Document Released: 02/08/2009 Document Revised: 09/20/2015 Document Reviewed: 05/19/2014 Elsevier Interactive Patient Education  2017 [employer].

## 2018-02-18 NOTE — Progress Notes (Signed)
Subjective:   Yolanda Foley is a 77 y.o. female who presents for Medicare Annual (Subsequent) preventive examination.  Review of Systems:   Cardiac Risk Factors include: advanced age (>27men, >53 women);hypertension;dyslipidemia     Objective:     Vitals: BP (!) 159/91   Pulse 77   Resp 12   Ht 5\' 5"  (1.651 m)   Wt 165 lb (74.8 kg)   SpO2 97%   BMI 27.46 kg/m   Body mass index is 27.46 kg/m.  Advanced Directives 02/18/2018 06/05/2016  Does Patient Have a Medical Advance Directive? No No  Would patient like information on creating a medical advance directive? Yes (ED - Information included in AVS) Yes (MAU/Ambulatory/Procedural Areas - Information given)    Tobacco Social History   Tobacco Use  Smoking Status Never Smoker  Smokeless Tobacco Never Used     Counseling given: Not Answered   Clinical Intake:  Pre-visit preparation completed: Yes  Pain : No/denies pain Pain Score: 0-No pain     BMI - recorded: 27.5 Nutritional Status: BMI 25 -29 Overweight Nutritional Risks: None Diabetes: No  How often do you need to have someone help you when you read instructions, pamphlets, or other written materials from your doctor or pharmacy?: 1 - Never What is the last grade level you completed in school?: college  Interpreter Needed?: No  Information entered by :: Francena Hanly LPN  Past Medical History:  Diagnosis Date  . Cholelithiasis   . Chronic pain   . Depression   . Elevated liver enzymes   . GERD (gastroesophageal reflux disease)   . Hyperlipidemia   . Hypertension   . Nonspecific elevation of levels of transaminase or lactic acid dehydrogenase (LDH)   . Palpitation   . Primary biliary cirrhosis [facility])    Past Surgical History:  Procedure Laterality Date  . correction of nasal surgery  35 years ago   . Left ovarian cyst removal  1974  . ROTATOR CUFF REPAIR Left 08/2016  . TONSILLECTOMY  1967    Family History  Problem Relation Age of Onset  .  Dementia Mother   . Heart failure Father   . Hypertension Father   . Cancer Brother 75       kidney  . Cancer Maternal Aunt        breast  . Dementia Maternal Aunt    Social History   Socioeconomic History  . Marital status: Married    Spouse name: Not on file  . Number of children: 4  . Years of education: 77  . Highest education level: Associate degree: academic program  Occupational History  . Occupation: retired   Scientific laboratory technician  . Financial resource strain: Not hard at all  . Food insecurity:    Worry: Never true    Inability: Never true  . Transportation needs:    Medical: No    Non-medical: No  Tobacco Use  . Smoking status: Never Smoker  . Smokeless tobacco: Never Used  Substance and Sexual Activity  . Alcohol use: No  . Drug use: No  . Sexual activity: Not Currently  Lifestyle  . Physical activity:    Days per week: 2 days    Minutes per session: 50 min  . Stress: Rather much  Relationships  . Social connections:    Talks on phone: Once a week    Gets together: Once a week    Attends religious service: 1 to 4 times per year  Active member of club or organization: Yes    Attends meetings of clubs or organizations: 1 to 4 times per year    Relationship status: Married  Other Topics Concern  . Not on file  Social History Narrative   Expressed to this nurse her frustration with her husband his controlling ways.    Outpatient Encounter Medications as of 02/18/2018  Medication Sig  . amLODipine (NORVASC) 5 MG tablet Take 1 tablet (5 mg total) by mouth daily.  Marland Kitchen aspirin (ASPIRIN LOW DOSE) 81 MG EC tablet Take 81 mg by mouth as needed. Take one tablet by mouth once a day  . Chlorophyll 50 MG CAPS Take 3 capsules by mouth daily.  . Coenzyme Q10-Fish Oil-Vit E (CO-Q 10 OMEGA-3 FISH OIL) CAPS Take 2 capsules by mouth daily.  . Digestive Enzymes (PAPAYA ENZYME) CHEW Chew by mouth as needed.  . Fish Oil-Krill Oil (KRILL OIL PLUS) CAPS Take 1 capsule by mouth  daily.  Nyoka Cowden Tea, Camillia sinensis, (GREEN TEA PO) Take by mouth. Patient states that this is a decaf tea and she drinks it 1-2 times daily  . MACA ROOT PO Take 523 mg by mouth.  . Methylsulfonylmethane (MSM) 500 MG CAPS Take 1 capsule by mouth daily.   . Milk Thistle 200 MG CAPS Take 1 capsule by mouth 2 (two) times daily.  . Multiple Vitamin (MULTIVITAMIN) tablet Take 1 tablet by mouth daily. With iron  . olmesartan (BENICAR) 40 MG tablet Take 1 tablet (40 mg total) by mouth daily.  . ursodiol (ACTIGALL) 300 MG capsule TAKE ONE CAPSULE BY MOUTH THREE TIMES DAILY AFTER  A  MEAL  . vitamin B-12 (CYANOCOBALAMIN) 1000 MCG tablet Take 1,000 mcg by mouth daily.  . vitamin C (ASCORBIC ACID) 500 MG tablet Take 500 mg by mouth 2 (two) times daily.   No facility-administered encounter medications on file as of 02/18/2018.     Activities of Daily Living In your present state of health, do you have any difficulty performing the following activities: 02/18/2018  Hearing? N  Vision? Y  Difficulty concentrating or making decisions? N  Walking or climbing stairs? N  Dressing or bathing? N  Doing errands, shopping? N  Preparing Food and eating ? N  Using the Toilet? N  In the past six months, have you accidently leaked urine? N  Do you have problems with loss of bowel control? N  Managing your Medications? N  Managing your Finances? N  Housekeeping or managing your Housekeeping? N  Some recent data might be hidden    Patient Care Team: Fayrene Helper, MD as PCP - General Leta Baptist, MD as Attending Physician (Otolaryngology) Rogene Houston, MD as Attending Physician (Gastroenterology) Leta Baptist, MD as Consulting Physician (Otolaryngology) Rogene Houston, MD as Consulting Physician (Gastroenterology)    Assessment:   This is a routine wellness examination for Yolanda Foley.  Exercise Activities and Dietary recommendations Current Exercise Habits: Structured exercise class, Type of  exercise: treadmill;stretching, Time (Minutes): 30, Frequency (Times/Week): 3, Weekly Exercise (Minutes/Week): 90, Intensity: Moderate, Exercise limited by: None identified  Goals    . Exercise 3x per week (30 min per time)     Recommend increasing silver sneakers at least 3 times a week, or walking an additional day.    . Patient Stated     See her counselor more often and learn to deal with emotional stress her husband puts her under        Fall Risk Fall  Risk  02/18/2018 08/25/2017 08/25/2017 06/05/2016 05/30/2015  Falls in the past year? No No No No Yes  Number falls in past yr: - - - - 2 or more  Injury with Fall? - - - - Yes  Risk for fall due to : Medication side effect;Impaired vision - - - -   Is the patient's home free of loose throw rugs in walkways, pet beds, electrical cords, etc?   no      Grab bars in the bathroom? no      Handrails on the stairs?   no      Adequate lighting?   no  Timed Get Up and Go performed: Patient able to perform in 5 seconds without assistance   Depression Screen PHQ 2/9 Scores 02/18/2018 08/25/2017 08/25/2017 06/05/2016  PHQ - 2 Score 2 1 0 0  PHQ- 9 Score 5 7 - -  Some encounter information is confidential and restricted. Go to Review Flowsheets activity to see all data.     Cognitive Function     6CIT Screen 02/18/2018 06/05/2016  What Year? 0 points 0 points  What month? 0 points 0 points  What time? 0 points 0 points  Count back from 20 0 points 0 points  Months in reverse 0 points 0 points  Repeat phrase 2 points 0 points  Total Score 2 0    Immunization History  Administered Date(s) Administered  . Influenza Split 02/05/2011  . Influenza Whole 01/21/2006, 05/31/2007, 02/25/2010  . Influenza,inj,Quad PF,6+ Mos 03/15/2013  . Pneumococcal Conjugate-13 05/30/2015  . Pneumococcal Polysaccharide-23 08/16/2008  . Td 08/16/2008    Qualifies for Shingles Vaccine?postponed   Screening Tests Health Maintenance  Topic Date Due  .  INFLUENZA VACCINE  03/18/2018 (Originally 11/26/2017)  . TETANUS/TDAP  08/17/2018  . DEXA SCAN  Completed  . PNA vac Low Risk Adult  Completed    Cancer Screenings: Lung: Low Dose CT Chest recommended if Age 65-80 years, 30 pack-year currently smoking OR have quit w/in 15years. Patient does not qualify. Breast:  Up to date on Mammogram? No   Up to date of Bone Density/Dexa? No Colorectal: up to date   Additional Screenings: Hepatitis C Screening: completed      Plan:   Plan to increase physical activity, see counselor, reduce stress    I have personally reviewed and noted the following in the patient's chart:   . Medical and social history . Use of alcohol, tobacco or illicit drugs  . Current medications and supplements . Functional ability and status . Nutritional status . Physical activity . Advanced directives . List of other physicians . Hospitalizations, surgeries, and ER visits in previous 12 months . Vitals . Screenings to include cognitive, depression, and falls . Referrals and appointments  In addition, I have reviewed and discussed with patient certain preventive protocols, quality metrics, and best practice recommendations. A written personalized care plan for preventive services as well as general preventive health recommendations were provided to patient.     Francoise Schaumann, LPN  [id number]

## 2018-02-25 ENCOUNTER — Ambulatory Visit (INDEPENDENT_AMBULATORY_CARE_PROVIDER_SITE_OTHER): Payer: Medicare Other | Admitting: Psychiatry

## 2018-02-25 ENCOUNTER — Encounter (HOSPITAL_COMMUNITY): Payer: Self-pay | Admitting: Psychiatry

## 2018-02-25 DIAGNOSIS — F329 Major depressive disorder, single episode, unspecified: Secondary | ICD-10-CM

## 2018-02-25 DIAGNOSIS — F32A Depression, unspecified: Secondary | ICD-10-CM

## 2018-02-25 NOTE — Progress Notes (Signed)
Patient:  Yolanda Foley   DOB: 1940/07/10  MR Number:    [id number]  Location: [place]:  [address] [place],  [place], [address]  Start: Thursday 02/25/2018 11:25 AM      End: Thursday 02/25/2018 12:04 PM                            Provider/Observer:     Maurice Small, MSW, LCSW   Chief Complaint:     Depression  Reason For Service:    Yolanda Foley is a 77 y.o. female who is seeking services due to experiencing symptoms of depression. She says  it seems as though she is over sensitive when things don't go her way. She reports finding herself snapping and being irritable. She says she and her husband moved back to [place] in 2001 after leaving in [place] for forty years. She says this is when symptoms of  depression seemed to start. She says she kept together while her husband worked when they were living in [place]. She states she also worked 2 jobs and went to school at the same time. During that time, her husband did not seem to appreciate her efforts. She states he would only comment when something was wrong and continues that same pattern. Per patient's report, the situation has become overbearing over the years. She reports additional stress related to her church. 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 Patient reports feeling unappreciated.    Interventions Strategy:  Supportive, psychoeducation, CBT  Participation Level:   Active  Participation Quality:  Appropriate and Sharing      Behavioral Observation:  Casual, Alert, and Appropriate.   Current Psychosocial Factors: Marital stress, phase of life, issues at church  Content of Session:     Reviewed symptoms, praise and reinforced patient's increased involvement in activity and having more realistic expectations of self, discussed stressors, facilitated expression of thoughts and feelings, validated feelings, discussed progress in  treatment and reviewed treatment plan, discussed next steps for treatment to include 4-5 more sessions focusing on improving mindfulness skills and identifying/implementing relapse prevention strategies  Current Status:    improved mood, decreased irritability, improved sleep patterns, decreased fatigue, decreased worry and anxiety  Suicidal/Homicidal:    No  Patient Progress:    Patient last was seen about 4 months ago. She reports trip to [place] and various other issues prevented her from scheduling an earlier appointment. She states letting some things go and being less perfectionistic about certain tasks being completed in her household. She reports having more realistic expectations of self. She has resumed involvement in [employer] and has been socializing with other class participants. She reports continued stress regarding husband as he has diabetes and refuses to adhere to treatment recommendations. She reports learning to try to accept she can't control his behavior.   Target Goals:   1. Learn and implement calming techniques to manage stress and anxiety.    2. Identify, challenge, and replace negative thinking patterns that contribute to anxiety and depression with healthy alternatives.  Last Reviewed:   02/25/2018  Goals Addressed Today:    1,2  Plan:      Return again in 2 weeks.  Impression/Diagnosis:   Patient presents with history of symptoms of depression that began in 2001 when patient moved back to [place] from  [place] to take care of my mother who has dementia. She reports no psychiatric hospitalization. Patient  participated in outpatient therapy at [facility] for about a year about 3 months ago.  Patient reports taking an antidepressant as prescribed by PCP for a brief period but it made her sleepy. Symptoms have  included depressed mood, irritability, poor sleep patterns, fatigue, poor concentration, excessive worrying, loss of interest in activities,  anxiety   Diagnosis:  Axis I: Depressive Disorder          Axis II: Deferred   Yolanda Butikofer, LCSW 02/25/2018

## 2018-03-02 ENCOUNTER — Ambulatory Visit (INDEPENDENT_AMBULATORY_CARE_PROVIDER_SITE_OTHER): Payer: Self-pay | Admitting: Internal Medicine

## 2018-03-02 ENCOUNTER — Encounter (INDEPENDENT_AMBULATORY_CARE_PROVIDER_SITE_OTHER): Payer: Self-pay | Admitting: Internal Medicine

## 2018-03-08 ENCOUNTER — Encounter: Payer: Self-pay | Admitting: Family Medicine

## 2018-03-08 ENCOUNTER — Ambulatory Visit (INDEPENDENT_AMBULATORY_CARE_PROVIDER_SITE_OTHER): Payer: Medicare Other | Admitting: Family Medicine

## 2018-03-08 VITALS — BP 142/86 | HR 94 | Resp 15 | Ht 65.0 in | Wt 166.0 lb

## 2018-03-08 DIAGNOSIS — I10 Essential (primary) hypertension: Secondary | ICD-10-CM

## 2018-03-08 DIAGNOSIS — R7401 Elevation of levels of liver transaminase levels: Secondary | ICD-10-CM

## 2018-03-08 DIAGNOSIS — R740 Nonspecific elevation of levels of transaminase and lactic acid dehydrogenase [LDH]: Secondary | ICD-10-CM | POA: Diagnosis not present

## 2018-03-08 DIAGNOSIS — E782 Mixed hyperlipidemia: Secondary | ICD-10-CM

## 2018-03-08 DIAGNOSIS — E663 Overweight: Secondary | ICD-10-CM | POA: Diagnosis not present

## 2018-03-08 DIAGNOSIS — K8070 Calculus of gallbladder and bile duct without cholecystitis without obstruction: Secondary | ICD-10-CM | POA: Diagnosis not present

## 2018-03-08 NOTE — Progress Notes (Signed)
   Yolanda Foley     MRN: [id number]      DOB: 31-Jul-1940   HPI Ms. Gasior is here for follow up and re-evaluation of chronic medical conditions, medication management and review of any available recent lab and radiology data.  Preventive health is updated, specifically  Cancer screening and Immunization.   Questions or concerns regarding consultations or procedures which the PT has had in the interim are  addressed. The PT denies any adverse reactions to current medications since the last visit.  There are no new concerns.    ROS Denies recent fever or chills. Denies sinus pressure, nasal congestion, ear pain or sore throat. Denies chest congestion, productive cough or wheezing. Denies chest pains, palpitations and leg swelling Denies abdominal pain, nausea, vomiting,diarrhea or constipation.   Denies dysuria, frequency, hesitancy or incontinence. Denies joint pain, swelling and limitation in mobility. Denies headaches, seizures, numbness, or tingling. Denies uncontrolled  depression, anxiety or insomnia. Denies skin break down or rash.   PE  BP (!) 142/86   Pulse 94   Resp 15   Ht 5\' 5"  (1.651 m)   Wt 166 lb (75.3 kg)   SpO2 99%   BMI 27.62 kg/m   Patient alert and oriented and in no cardiopulmonary distress.  HEENT: No facial asymmetry, EOMI,   oropharynx pink and moist.  Neck supple no JVD, no mass.  Chest: Clear to auscultation bilaterally.  CVS: S1, S2 no murmurs, no S3.Regular rate.  ABD: Soft non tender.   Ext: No edema  MS: Adequate ROM spine, shoulders, hips and knees.  Skin: Intact, no ulcerations or rash noted.  Psych: Good eye contact, normal affect. Memory intact not anxious or depressed appearing.  CNS: CN 2-12 intact, power,  normal throughout.no focal deficits noted.   Assessment & Plan  Essential hypertension Adequately though sub optimally Controlled, no change in medication DASH diet and commitment to daily physical activity for a minimum of  30 minutes discussed and encouraged, as a part of hypertension management. The importance of attaining a healthy weight is also discussed.  BP/Weight 03/11/2018 03/08/2018 02/18/2018 08/25/2017 03/24/2017 03/02/2017 15/40/0867  Systolic BP 619 509 326 [phone] 833  Diastolic BP 80 86 91 84 82 82 90  Wt. (Lbs) 163.9 166 165 165 160 159.3 158.5  BMI 27.27 27.62 27.46 27.46 26.63 26.51 25.58       Cholelithiasis Asymptomatic, decision to forgo cholecystectomy  Hyperlipemia Hyperlipidemia:Low fat diet discussed and encouraged.   Lipid Panel  Lab Results  Component Value Date   CHOL 182 09/17/2016   HDL 63 09/17/2016   LDLCALC 105 (H) 09/17/2016   TRIG 70 09/17/2016   CHOLHDL 2.9 09/17/2016     Updated lab needed at/ before next visit.   Overweight (BMI 25.0-29.9) Improved Patient re-educated about  the importance of commitment to a  minimum of 150 minutes of exercise per week.  The importance of healthy food choices with portion control discussed. Encouraged to start a food diary, count calories and to consider  joining a support group. Sample diet sheets offered. Goals set by the patient for the next several months.   Weight /BMI 03/11/2018 03/08/2018 02/18/2018  WEIGHT 163 lb 14.4 oz 166 lb 165 lb  HEIGHT 5\' 5"  5\' 5"  5\' 5"   BMI 27.27 kg/m2 27.62 kg/m2 27.46 kg/m2

## 2018-03-08 NOTE — Patient Instructions (Addendum)
F/U in 5 months, call if you need me before  Continue current  BP meds, IF amlodipine is recalled , I will be in touch  Need to reduce butter, oil, cheese, and fried foods and red meat  Increase vegetable, and fruit, NATURAL  Fasting lipid, cmp and EGFR an CBC this week, collect `order at checkout for [facility]  Please schedule appt with Dr Laural Golden, and also with therapist

## 2018-03-09 ENCOUNTER — Ambulatory Visit (INDEPENDENT_AMBULATORY_CARE_PROVIDER_SITE_OTHER): Payer: Medicare Other | Admitting: Psychiatry

## 2018-03-09 ENCOUNTER — Encounter: Payer: Self-pay | Admitting: Family Medicine

## 2018-03-09 ENCOUNTER — Telehealth: Payer: Self-pay | Admitting: Family Medicine

## 2018-03-09 DIAGNOSIS — F32A Depression, unspecified: Secondary | ICD-10-CM

## 2018-03-09 DIAGNOSIS — F329 Major depressive disorder, single episode, unspecified: Secondary | ICD-10-CM

## 2018-03-09 DIAGNOSIS — Z1231 Encounter for screening mammogram for malignant neoplasm of breast: Secondary | ICD-10-CM | POA: Diagnosis not present

## 2018-03-09 NOTE — Progress Notes (Signed)
   THERAPIST PROGRESS NOTE  Session Time: Tuesday 03/09/2018 2:05 PM - 2: 52 PM   Participation Level: Active  Behavioral Response: Well GroomedAlertEuthymic  Type of Therapy: Individual Therapy  Treatment Goals addressed: learn and implement cognitive and behavioral strategies to cope with depression  Interventions: Supportive  Summary: Yolanda Foley is a 77 y.o. female who presents with history of symptoms of depression that began in 2001 when patient moved back to [place] from [place] to take care of my mother who has dementia. She reports no psychiatric hospitalization. Patient participated in outpatient therapy at [facility] for about a year about 3 months ago. Patient reports taking an antidepressant as prescribed by PCP for a brief period but it made her sleepy. Symptoms have  included depressed mood, irritability, poor sleep patterns, fatigue, poor concentration, excessive worrying, loss of interest in activities, anxiety.  Patient last was seen 2 weeks ago. She reports doing fairly well but expresses continued frustration with husband. She is particularly stressed by husband being noncompliant regarding health recommendations as well as issues in their relationship.    Suicidal/Homicidal: Nowithout intent/plan  Therapist Response: Discussed stressors, facilitated expression of thoughts and feelings, validated feelings, discussed using mindfulness and the window of tolerance to regulate emotions, discussed benefits of mindfulness, assisted patient identify signs she is going outside her window of tolerance, discussed and practiced grounding techniques to use when outside window of tolerance, discussed rationale for regular practice of mindfulness, introduced mindfulness exercises to practice (eating mindfully, 3 minute breathing space), assigned patient to practice a mindfulness activity daily.   Plan: Return again in 2-3 weeks.  Diagnosis: Axis I: Depressive Disorder     Axis II:  No diagnosis    Noreene Boreman, LCSW 03/09/2018

## 2018-03-09 NOTE — Telephone Encounter (Signed)
Pls ;let pt know there is no recall on amlodipine

## 2018-03-10 NOTE — Telephone Encounter (Signed)
Spoke with patient she is aware

## 2018-03-11 ENCOUNTER — Encounter (INDEPENDENT_AMBULATORY_CARE_PROVIDER_SITE_OTHER): Payer: Self-pay | Admitting: *Deleted

## 2018-03-11 ENCOUNTER — Ambulatory Visit (INDEPENDENT_AMBULATORY_CARE_PROVIDER_SITE_OTHER): Payer: Medicare Other | Admitting: Internal Medicine

## 2018-03-11 ENCOUNTER — Encounter (INDEPENDENT_AMBULATORY_CARE_PROVIDER_SITE_OTHER): Payer: Self-pay | Admitting: Internal Medicine

## 2018-03-11 VITALS — BP 170/80 | HR 60 | Temp 97.0°F | Ht 65.0 in | Wt 163.9 lb

## 2018-03-11 DIAGNOSIS — K743 Primary biliary cirrhosis: Secondary | ICD-10-CM | POA: Diagnosis not present

## 2018-03-11 NOTE — Progress Notes (Signed)
Subjective:    Patient ID: Yolanda Foley, female    DOB: 02/23/1941, 77 y.o.   MRN: [id number]  HPI Here today for f/u. Last seen 03/02/2017. Hx of Primary biliary cirrhosis. Diagnosed in August of 2012 based on mitochondrial antibody.  Her appetite is good. No weight loss. Has a BM daily. No melena or BRRB. She is exercising twice a week at the [facility] 08/24/2017 03/02/2017 02/06/2017  Glucose 65 - 139 mg/dL 85 - 79  BUN 7 - 25 mg/dL 11 - 10  Creatinine 0.60 - 0.93 mg/dL 0.76 - 0.77  Sodium 135 - 146 mmol/L 139 - 136  Potassium 3.5 - 5.3 mmol/L 4.2 - 4.1  Chloride 98 - 110 mmol/L 104 - 102  CO2 20 - 32 mmol/L 30 - 29  Calcium 8.6 - 10.4 mg/dL 8.9 - 9.0  Total Protein 6.1 - 8.1 g/dL 8.1 7.8 8.6(H)  Total Bilirubin 0.2 - 1.2 mg/dL 0.4 0.4 0.4  Alkaline Phos 33 - 130 U/L - - -  AST 10 - 35 U/L 33 38(H) 71(H)  ALT 6 - 29 U/L 19 30(H) 52(H)    Review of Systems Past Medical History:  Diagnosis Date  . Cholelithiasis   . Chronic pain   . Depression   . Elevated liver enzymes   . GERD (gastroesophageal reflux disease)   . Hyperlipidemia   . Hypertension   . Nonspecific elevation of levels of transaminase or lactic acid dehydrogenase (LDH)   . Palpitation   . Primary biliary cirrhosis [facility])     Past Surgical History:  Procedure Laterality Date  . correction of nasal surgery  35 years ago   . Left ovarian cyst removal  1974  . ROTATOR CUFF REPAIR Left 08/2016  . TONSILLECTOMY  1967     Allergies  Allergen Reactions  . [place]   . Fluoxetine Other (See Comments)    Dull headaache  . Oxycodone Nausea And Vomiting  . Sulfonamide Derivatives     Current Outpatient Medications on File Prior to Visit  Medication Sig Dispense Refill  . amLODipine (NORVASC) 5 MG tablet Take 1 tablet (5 mg total) by mouth daily. 30 tablet 3  . aspirin (ASPIRIN LOW DOSE) 81 MG EC tablet Take 81 mg by mouth as needed. Take one tablet by mouth once a  day    . Chlorophyll 50 MG CAPS Take 3 capsules by mouth daily.    . Coenzyme Q10-Fish Oil-Vit E (CO-Q 10 OMEGA-3 FISH OIL) CAPS Take 2 capsules by mouth daily.    . Digestive Enzymes (PAPAYA ENZYME) CHEW Chew by mouth as needed.    . Fish Oil-Krill Oil (KRILL OIL PLUS) CAPS Take 1 capsule by mouth daily.    Nyoka Cowden Tea, Camillia sinensis, (GREEN TEA PO) Take by mouth. Patient states that this is a decaf tea and she drinks it 1-2 times daily    . IRON CR PO Take 80 mg by mouth daily.    Marland Kitchen MACA ROOT PO Take 523 mg by mouth.    . Methylsulfonylmethane (MSM) 500 MG CAPS Take 1 capsule by mouth daily.     . Milk Thistle 200 MG CAPS Take 1 capsule by mouth 2 (two) times daily.    . Multiple Vitamin (MULTIVITAMIN) tablet Take 1 tablet by mouth daily. With iron    . olmesartan (BENICAR) 40 MG tablet TAKE 1 TABLET BY MOUTH ONCE DAILY  30 tablet 3  . ursodiol (ACTIGALL) 300 MG capsule TAKE ONE CAPSULE BY MOUTH THREE TIMES DAILY AFTER  A  MEAL 90 capsule 4  . vitamin B-12 (CYANOCOBALAMIN) 1000 MCG tablet Take 1,000 mcg by mouth daily.    . vitamin C (ASCORBIC ACID) 500 MG tablet Take 500 mg by mouth 2 (two) times daily.     No current facility-administered medications on file prior to visit.         Objective:   Physical Exam Blood pressure (!) 170/80, pulse 60, temperature (!) 97 F (36.1 C), height 5\' 5"  (1.651 m), weight 163 lb 14.4 oz (74.3 kg).  Alert and oriented. Skin warm and dry. Oral mucosa is moist.   . Sclera anicteric, conjunctivae is pink. Thyroid not enlarged. No cervical lymphadenopathy. Lungs clear. Heart regular rate and rhythm.  Abdomen is soft. Bowel sounds are positive. No hepatomegaly. No abdominal masses felt. No tenderness.  No edema to lower extremities.          Assessment & Plan:  PBS. She will go to lab and have lab work drawn. (CBC and Hepatic) She will have OV in 1 year.  US abdomen

## 2018-03-11 NOTE — Patient Instructions (Addendum)
OV in 1 year. She will have labs drawn tomorrow.

## 2018-03-12 ENCOUNTER — Encounter: Payer: Self-pay | Admitting: Family Medicine

## 2018-03-12 ENCOUNTER — Telehealth: Payer: Self-pay | Admitting: Family Medicine

## 2018-03-12 DIAGNOSIS — E663 Overweight: Secondary | ICD-10-CM | POA: Insufficient documentation

## 2018-03-12 NOTE — Assessment & Plan Note (Signed)
Adequately though sub optimally Controlled, no change in medication DASH diet and commitment to daily physical activity for a minimum of 30 minutes discussed and encouraged, as a part of hypertension management. The importance of attaining a healthy weight is also discussed.  BP/Weight 03/11/2018 03/08/2018 02/18/2018 08/25/2017 03/24/2017 03/02/2017 82/64/1583  Systolic BP 094 076 808 811 031 594 585  Diastolic BP 80 86 91 84 82 82 90  Wt. (Lbs) 163.9 166 165 165 160 159.3 158.5  BMI 27.27 27.62 27.46 27.46 26.63 26.51 25.58

## 2018-03-12 NOTE — Telephone Encounter (Signed)
pls let patient know that amlodipine has NOT been recalled, she thought there was a problem with it, this is not the case. Thanks

## 2018-03-12 NOTE — Assessment & Plan Note (Signed)
Improved Patient re-educated about  the importance of commitment to a  minimum of 150 minutes of exercise per week.  The importance of healthy food choices with portion control discussed. Encouraged to start a food diary, count calories and to consider  joining a support group. Sample diet sheets offered. Goals set by the patient for the next several months.   Weight /BMI 03/11/2018 03/08/2018 02/18/2018  WEIGHT 163 lb 14.4 oz 166 lb 165 lb  HEIGHT 5\' 5"  5\' 5"  5\' 5"   BMI 27.27 kg/m2 27.62 kg/m2 27.46 kg/m2

## 2018-03-12 NOTE — Assessment & Plan Note (Signed)
Asymptomatic, decision to forgo cholecystectomy

## 2018-03-12 NOTE — Telephone Encounter (Signed)
Spoke with patient she is aware

## 2018-03-12 NOTE — Assessment & Plan Note (Signed)
Hyperlipidemia:Low fat diet discussed and encouraged.   Lipid Panel  Lab Results  Component Value Date   CHOL 182 09/17/2016   HDL 63 09/17/2016   LDLCALC 105 (H) 09/17/2016   TRIG 70 09/17/2016   CHOLHDL 2.9 09/17/2016     Updated lab needed at/ before next visit.

## 2018-03-16 ENCOUNTER — Ambulatory Visit (HOSPITAL_COMMUNITY): Payer: Medicare Other

## 2018-03-16 DIAGNOSIS — E782 Mixed hyperlipidemia: Secondary | ICD-10-CM | POA: Diagnosis not present

## 2018-03-16 DIAGNOSIS — I10 Essential (primary) hypertension: Secondary | ICD-10-CM | POA: Diagnosis not present

## 2018-03-16 LAB — COMPLETE METABOLIC PANEL WITH GFR
AG Ratio: 0.9 (calc) — ABNORMAL LOW (ref 1.0–2.5)
ALKALINE PHOSPHATASE (APISO): 191 U/L — AB (ref 33–130)
ALT: 20 U/L (ref 6–29)
AST: 32 U/L (ref 10–35)
Albumin: 4 g/dL (ref 3.6–5.1)
BUN: 10 mg/dL (ref 7–25)
CALCIUM: 9.2 mg/dL (ref 8.6–10.4)
CO2: 27 mmol/L (ref 20–32)
CREATININE: 0.84 mg/dL (ref 0.60–0.93)
Chloride: 103 mmol/L (ref 98–110)
GFR, EST NON AFRICAN AMERICAN: 66 mL/min/{1.73_m2} (ref >=60)
GFR, Est African American: 77 mL/min/{1.73_m2} (ref >=60)
GLOBULIN: 4.3 g/dL — AB (ref 1.9–3.7)
GLUCOSE: 84 mg/dL (ref 65–99)
Potassium: 3.8 mmol/L (ref 3.5–5.3)
SODIUM: 139 mmol/L (ref 135–146)
Total Bilirubin: 0.4 mg/dL (ref 0.2–1.2)
Total Protein: 8.3 g/dL — ABNORMAL HIGH (ref 6.1–8.1)

## 2018-03-16 LAB — LIPID PANEL
CHOL/HDL RATIO: 3.5 (calc) (ref <5.0)
Cholesterol: 204 mg/dL — ABNORMAL HIGH (ref <200)
HDL: 59 mg/dL (ref >50)
LDL CHOLESTEROL (CALC): 126 mg/dL — AB
Non-HDL Cholesterol (Calc): 145 mg/dL (calc) — ABNORMAL HIGH (ref <130)
Triglycerides: 89 mg/dL (ref <150)

## 2018-03-16 LAB — CBC
HEMATOCRIT: 33.2 % — AB (ref 35.0–45.0)
Hemoglobin: 11 g/dL — ABNORMAL LOW (ref 11.7–15.5)
MCH: 29.4 pg (ref 27.0–33.0)
MCHC: 33.1 g/dL (ref 32.0–36.0)
MCV: 88.8 fL (ref 80.0–100.0)
MPV: 13.1 fL — AB (ref 7.5–12.5)
PLATELETS: 136 10*3/uL — AB (ref 140–400)
RBC: 3.74 10*6/uL — AB (ref 3.80–5.10)
RDW: 12.7 % (ref 11.0–15.0)
WBC: 4 10*3/uL (ref 3.8–10.8)

## 2018-03-17 NOTE — Progress Notes (Signed)
Spoke with patient she is aware

## 2018-03-23 ENCOUNTER — Ambulatory Visit (HOSPITAL_COMMUNITY)
Admission: RE | Admit: 2018-03-23 | Discharge: 2018-03-23 | Disposition: A | Discharge status: Home / Self Care | Payer: Medicare Other | Source: Ambulatory Visit | Attending: Internal Medicine | Admitting: Internal Medicine

## 2018-03-23 DIAGNOSIS — D1803 Hemangioma of intra-abdominal structures: Secondary | ICD-10-CM | POA: Insufficient documentation

## 2018-03-23 DIAGNOSIS — K743 Primary biliary cirrhosis: Secondary | ICD-10-CM | POA: Diagnosis not present

## 2018-03-23 DIAGNOSIS — K8020 Calculus of gallbladder without cholecystitis without obstruction: Secondary | ICD-10-CM | POA: Diagnosis not present

## 2018-04-29 ENCOUNTER — Ambulatory Visit (HOSPITAL_COMMUNITY): Payer: Medicare Other | Admitting: Psychiatry

## 2018-05-17 ENCOUNTER — Encounter (HOSPITAL_COMMUNITY): Payer: Self-pay | Admitting: Psychiatry

## 2018-05-17 ENCOUNTER — Ambulatory Visit (INDEPENDENT_AMBULATORY_CARE_PROVIDER_SITE_OTHER): Payer: Medicare Other | Admitting: Psychiatry

## 2018-05-17 DIAGNOSIS — F329 Major depressive disorder, single episode, unspecified: Secondary | ICD-10-CM

## 2018-05-17 DIAGNOSIS — F32A Depression, unspecified: Secondary | ICD-10-CM

## 2018-05-17 NOTE — Progress Notes (Signed)
   THERAPIST PROGRESS NOTE  Session Time: Monday 05/17/2018 1:16 PM -  1:54 PM  Participation Level: Active  Behavioral Response: Well GroomedAlertEuthymic  Type of Therapy: Individual Therapy  Treatment Goals addressed: learn and implement cognitive and behavioral strategies to cope with depression  Interventions: Supportive  Summary: Yolanda Foley is a 78 y.o. female who presents with history of symptoms of depression that began in 2001 when patient moved back to [place] from [place] to take care of my mother who has dementia. She reports no psychiatric hospitalization. Patient participated in outpatient therapy at [facility] and Families for about a year about 3 months ago. Patient reports taking an antidepressant as prescribed by PCP for a brief period but it made her sleepy. Symptoms have  included depressed mood, irritability, poor sleep patterns, fatigue, poor concentration, excessive worrying, loss of interest in activities, anxiety.  Patient last was seen in November 2019. She reports increased stress since last session. She and husband went to [place] for 3 weeks to visit their children. 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 She reports additional stress related to tension in the relationship between her husband and son after husband made extravagant request from their son per patient's report. She continues to experience frustration in marriage as husband remains overbearing per her report. Patient reports she has not been using the materials provided in last session. .    Suicidal/Homicidal: Nowithout intent/plan  Therapist Response: Discussed stressors, facilitated expression of thoughts and feelings, validated feelings, reviewed rationale for using mindfulness, reviewed mindfulness exercises, assigned patient to review previous handouts and  practice a mindfulness activity daily.   Plan: Return again in 2-3  weeks.  Diagnosis: Axis I: Depressive Disorder     Axis II: No diagnosis    Alonza Smoker, LCSW 05/17/2018

## 2018-05-31 ENCOUNTER — Ambulatory Visit (INDEPENDENT_AMBULATORY_CARE_PROVIDER_SITE_OTHER): Payer: Medicare Other | Admitting: Psychiatry

## 2018-05-31 ENCOUNTER — Encounter (HOSPITAL_COMMUNITY): Payer: Self-pay | Admitting: Psychiatry

## 2018-05-31 DIAGNOSIS — F329 Major depressive disorder, single episode, unspecified: Secondary | ICD-10-CM

## 2018-05-31 DIAGNOSIS — F32A Depression, unspecified: Secondary | ICD-10-CM

## 2018-05-31 NOTE — Progress Notes (Signed)
   THERAPIST PROGRESS NOTE  Session Time: Monday 05/31/2018 2:15 PM - 3:00 PM  Participation Level: Active  Behavioral Response: Well GroomedAlert/Anxious  Type of There  apy: Individual Therapy  Treatment Goals addressed: learn and implement cognitive and behavioral strategies to cope with depression  Interventions: Supportive/CBT  Summary: Yolanda Foley is a 78 y.o. female who presents with history of symptoms of depression that began in 2001 when patient moved back to [place] from [place] to take care of my mother who has dementia. She reports no psychiatric hospitalization. Patient participated in outpatient therapy at [facility] and Families for about a year about 3 months ago. Patient reports taking an antidepressant as prescribed by PCP for a brief period but it made her sleepy. Symptoms have  included depressed mood, irritability, poor sleep patterns, fatigue, poor concentration, excessive worrying, loss of interest in activities, anxiety.  Patient last was seen about 3 weeks ago. 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 She states always feeling in a rush. 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 She reports difficulty saying no. She reports she did not practice mindfulness activity as she forgot.    Suicidal/Homicidal: Nowithout intent/plan  Therapist Response: Discussed stressors, facilitated expression of thoughts and feelings, validated feelings, discussed role of balance in coping with stress, reviewed rationale for using mindfulness, reviewed mindfulness exercises, assisted patient develop plan to use alarm to help remember to practice exercises. assigned patient to review previous handouts and  practice a mindfulness activity daily.   Plan: Return again in 2-3 weeks.  Diagnosis: Axis I: Depressive Disorder     Axis II: No  diagnosis    Alonza Smoker, LCSW 05/31/2018

## 2018-06-14 ENCOUNTER — Ambulatory Visit (HOSPITAL_COMMUNITY): Payer: Medicare Other | Admitting: Psychiatry

## 2018-06-24 ENCOUNTER — Other Ambulatory Visit (INDEPENDENT_AMBULATORY_CARE_PROVIDER_SITE_OTHER): Payer: Self-pay | Admitting: Internal Medicine

## 2018-06-26 ENCOUNTER — Other Ambulatory Visit (INDEPENDENT_AMBULATORY_CARE_PROVIDER_SITE_OTHER): Payer: Self-pay | Admitting: Internal Medicine

## 2018-06-28 ENCOUNTER — Ambulatory Visit (INDEPENDENT_AMBULATORY_CARE_PROVIDER_SITE_OTHER): Payer: Medicare Other | Admitting: Psychiatry

## 2018-06-28 DIAGNOSIS — F32A Depression, unspecified: Secondary | ICD-10-CM

## 2018-06-28 DIAGNOSIS — F329 Major depressive disorder, single episode, unspecified: Secondary | ICD-10-CM

## 2018-06-28 NOTE — Progress Notes (Signed)
   THERAPIST PROGRESS NOTE  Session Time: Monday 06/28/2018 1:12 PM - 1:53 PM  Participation Level: Active  Behavioral Response: Well GroomedAlert/Anxious  Type of There  apy: Individual Therapy  Treatment Goals addressed: learn and implement cognitive and behavioral strategies to cope with depression  Interventions: Supportive/CBT  Summary: Yolanda Foley is a 78 y.o. female who presents with history of symptoms of depression that began in 2001 when patient moved back to [place] from [place] to take care of my mother who has dementia. She reports no psychiatric hospitalization. Patient participated in outpatient therapy at [facility] and Families for about a year about 3 months ago. Patient reports taking an antidepressant as prescribed by PCP for a brief period but it made her sleepy. Symptoms have  included depressed mood, irritability, poor sleep patterns, fatigue, poor concentration, excessive worrying, loss of interest in activities, anxiety.  Patient last was seen about 3 weeks ago. She reports being very busy with her various responsibilities at church as well as caretaker responsibilities for her husband. She continues to feel overwhelmed but remains hesitant to give up some of her responsibilities at church. She likes her activities but pressures self when she doesn't complete tasks. She has reviewed mindfulness handouts and reports she has been practicing deep breathing. She reports becoming more aware when she is becoming distressed and has been using grounding techniques. She reports difficulty regarding practicing mindfulness activity daily.   Suicidal/Homicidal: Nowithout intent/plan  Therapist Response: Discussed stressors, facilitated expression of thoughts and feelings, validated feelings, praised and reinforced patient's becoming more aware and using grounding techniques, discussed effects.  discussed her thoughts regarding perfectionism, discussed thinking brain versus observing brain, used  cognitive defusion to help patient cope with ruminating thoughts,  Plan: Return again in 2-3 weeks.  Diagnosis: Axis I: Depressive Disorder     Axis II: No diagnosis    Alonza Smoker, LCSW 06/28/2018

## 2018-07-12 ENCOUNTER — Ambulatory Visit (HOSPITAL_COMMUNITY): Payer: Medicare Other | Admitting: Psychiatry

## 2018-07-12 ENCOUNTER — Ambulatory Visit (INDEPENDENT_AMBULATORY_CARE_PROVIDER_SITE_OTHER): Payer: Medicare Other | Admitting: Psychiatry

## 2018-07-12 ENCOUNTER — Other Ambulatory Visit: Payer: Self-pay

## 2018-07-12 DIAGNOSIS — F329 Major depressive disorder, single episode, unspecified: Secondary | ICD-10-CM | POA: Diagnosis not present

## 2018-07-12 DIAGNOSIS — Z72821 Inadequate sleep hygiene: Secondary | ICD-10-CM | POA: Diagnosis not present

## 2018-07-12 DIAGNOSIS — F32A Depression, unspecified: Secondary | ICD-10-CM

## 2018-07-12 NOTE — Progress Notes (Signed)
   THERAPIST PROGRESS NOTE  Session Time: Monday 07/12/2018 3:15 PM - 4:05 PM           Participation Level: Active  Behavioral Response: Well GroomedAlert/Anxious  Type of There  apy: Individual Therapy  Treatment Goals addressed: learn and implement cognitive and behavioral strategies to cope with depression  Interventions: Supportive/CBT  Summary: Yolanda Foley is a 78 y.o. female who presents with history of symptoms of depression that began in 2001 when patient moved back to [place] from [place] to take care of my mother who has dementia. She reports no psychiatric hospitalization. Patient participated in outpatient therapy at [facility] and Families for about a year about 3 months ago. Patient reports taking an antidepressant as prescribed by PCP for a brief period but it made her sleepy. Symptoms have  included depressed mood, irritability, poor sleep patterns, fatigue, poor concentration, excessive worrying, loss of interest in activities, anxiety.  Patient last was seen about 2-3 weeks ago. She reports increased stress and anxiety. She expresses worry and frustration regarding husband's cognitive issus. 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 She is experiencing increased sleep difficulty and reports poor sleep hygiene. Patient is planning to talk with Dr. Moshe Cipro regarding possible medication for anxiety during her appointment in April 2020.   Suicidal/Homicidal: Nowithout intent/plan  Therapist Response: Discussed stressors, facilitated expression of thoughts and feelings, validated feelings,  assisted patient identify ways to have assertive communication with cousin, assisted patient to begin to identify and prioritize behavioral targets, reviewed rationale for practicing  deep breathing regularly, developed plan with patient to practice deep breathing 5-10 minutes 2 x per day, assisted patient develop plan to improve sleep hygiene and implement bedtime ritual,    Plan: Return again in 2-3 weeks.  Diagnosis: Axis I: Depressive Disorder     Axis II: No diagnosis    Alonza Smoker, LCSW 07/12/2018

## 2018-08-09 ENCOUNTER — Ambulatory Visit: Payer: Self-pay | Admitting: Family Medicine

## 2018-08-11 ENCOUNTER — Ambulatory Visit (HOSPITAL_COMMUNITY): Payer: Medicare Other | Admitting: Psychiatry

## 2018-08-17 ENCOUNTER — Ambulatory Visit (INDEPENDENT_AMBULATORY_CARE_PROVIDER_SITE_OTHER): Payer: Medicare Other | Admitting: Family Medicine

## 2018-08-17 ENCOUNTER — Ambulatory Visit: Payer: Self-pay | Admitting: Family Medicine

## 2018-08-17 ENCOUNTER — Other Ambulatory Visit: Payer: Self-pay

## 2018-08-17 VITALS — BP 160/80 | Ht 63.0 in | Wt 165.0 lb

## 2018-08-17 DIAGNOSIS — E782 Mixed hyperlipidemia: Secondary | ICD-10-CM

## 2018-08-17 DIAGNOSIS — E663 Overweight: Secondary | ICD-10-CM | POA: Diagnosis not present

## 2018-08-17 DIAGNOSIS — D649 Anemia, unspecified: Secondary | ICD-10-CM

## 2018-08-17 DIAGNOSIS — F4329 Adjustment disorder with other symptoms: Secondary | ICD-10-CM

## 2018-08-17 DIAGNOSIS — I10 Essential (primary) hypertension: Secondary | ICD-10-CM

## 2018-08-17 MED ORDER — AMLODIPINE BESYLATE 2.5 MG PO TABS: 2.5000 mg | ORAL_TABLET | Freq: Every day | ORAL | 5 refills | Status: DC

## 2018-08-17 MED ORDER — AMLODIPINE BESYLATE 5 MG PO TABS: 5.0000 mg | ORAL_TABLET | Freq: Every day | ORAL | 0 refills | Status: DC

## 2018-08-17 MED ORDER — OLMESARTAN MEDOXOMIL 40 MG PO TABS: 40.0000 mg | ORAL_TABLET | Freq: Every day | ORAL | 0 refills | Status: DC

## 2018-08-17 NOTE — Patient Instructions (Addendum)
F/U with MD office visit, in the next  5 Weeks , Wednesday morning appointment, needs blood pressure evaluated at the visit  Start additional medication , amlodipine 2.5 mg one every morning at 11 am when you take the olmasartan 40 mg tablet for your blood pressure     Please get fasting lipid, cmp and EGFr, TSH and CBC 1 week before the visit  We are also sending information on dealing with the stress associated with being home bound  Thanks for choosing [facility], we consider it a privelige to serve you.

## 2018-08-17 NOTE — Progress Notes (Signed)
Virtual Visit via Telephone Note  I connected with Yolanda Foley on 08/17/18 at  9:40 AM EDT by telephone and verified that I am speaking with the correct person using two identifiers.   I discussed the limitations, risks, security and privacy concerns of performing an evaluation and management service by telephone and the availability of in person appointments. I also discussed with the patient that there may be a patient responsible charge related to this service. The patient expressed understanding and agreed to proceed. I am in my home and also the patient is in her home, web ex attempt is unsuccessful   History of Present Illness: F/u chronic problems and states generally blood pressure is  140/68  and is generally good when she checks it at home Does report increased stress with being home bound, and would like help with this Review of Systems  Constitutional: Negative for chills and fever.  HENT: Negative for congestion and sinus pain.   Respiratory: Negative for cough and sputum production.   Cardiovascular: Negative for chest pain and palpitations.  Gastrointestinal: Negative for abdominal pain and heartburn.  Genitourinary: Negative for dysuria and frequency.  Musculoskeletal: Negative for myalgias.  Neurological: Negative for dizziness.  Psychiatric/Behavioral: The patient is nervous/anxious.       Observations/Objective: BP (!) 160/80   Ht 5\' 3"  (1.6 m)   Wt 165 lb (74.8 kg)   BMI 29.23 kg/m    Assessment and Plan:  Essential hypertension Uncontrolled add amlodipine 2.5 mg daily, with f/u in office in 5 weeks DASH diet and commitment to daily physical activity for a minimum of 30 minutes discussed and encouraged, as a part of hypertension management. The importance of attaining a healthy weight is also discussed.  BP/Weight 08/17/2018 03/11/2018 03/08/2018 02/18/2018 08/25/2017 03/24/2017 08/28/9765  Systolic BP 341 937 902 409 735 329 924  Diastolic BP 80 80 86 91 84  82 82  Wt. (Lbs) 165 163.9 166 165 165 160 159.3  BMI 29.23 27.27 27.62 27.46 27.46 26.63 26.51       Hyperlipemia Hyperlipidemia:Low fat diet discussed and encouraged.   Lipid Panel  Lab Results  Component Value Date   CHOL 204 (H) 03/16/2018   HDL 59 03/16/2018   LDLCALC 126 (H) 03/16/2018   TRIG 89 03/16/2018   CHOLHDL 3.5 03/16/2018     Updated lab needed at/ before next visit.   Overweight (BMI 25.0-29.9)   Patient re-educated about  the importance of commitment to a  minimum of 150 minutes of exercise per week as able.  The importance of healthy food choices with portion control discussed, as well as eating regularly and within a 12 hour window most days. The need to choose "clean , green" food 50 to 75% of the time is discussed, as well as to make water the primary drink and set a goal of 64 ounces water daily.    Weight /BMI 08/17/2018 03/11/2018 03/08/2018  WEIGHT 165 lb 163 lb 14.4 oz 166 lb  HEIGHT 5\' 3"  5\' 5"  5\' 5"   BMI 29.23 kg/m2 27.27 kg/m2 27.62 kg/m2      ANEMIA Updated lab needed at/ before next visit.   Stress and adjustment reaction Reports increased stress and anxiety with need to remain at home, discussed and encouraged ' coping strategies, which she will begin, also  will send toolkit for her review Counseled re stress management for 5 minutes    Follow Up Instructions:    I discussed the assessment and treatment plan  with the patient. The patient was provided an opportunity to ask questions and all were answered. The patient agreed with the plan and demonstrated an understanding of the instructions.   The patient was advised to call back or seek an in-person evaluation if the symptoms worsen or if the condition fails to improve as anticipated.  I provided 48minutes of non-face-to-face time during this encounter.   Tula Nakayama, MD

## 2018-08-22 ENCOUNTER — Encounter: Payer: Self-pay | Admitting: Family Medicine

## 2018-08-22 DIAGNOSIS — F4329 Adjustment disorder with other symptoms: Secondary | ICD-10-CM | POA: Insufficient documentation

## 2018-08-22 NOTE — Assessment & Plan Note (Signed)
Hyperlipidemia:Low fat diet discussed and encouraged.   Lipid Panel  Lab Results  Component Value Date   CHOL 204 (H) 03/16/2018   HDL 59 03/16/2018   LDLCALC 126 (H) 03/16/2018   TRIG 89 03/16/2018   CHOLHDL 3.5 03/16/2018     Updated lab needed at/ before next visit.

## 2018-08-22 NOTE — Assessment & Plan Note (Signed)
   Patient re-educated about  the importance of commitment to a  minimum of 150 minutes of exercise per week as able.  The importance of healthy food choices with portion control discussed, as well as eating regularly and within a 12 hour window most days. The need to choose "clean , green" food 50 to 75% of the time is discussed, as well as to make water the primary drink and set a goal of 64 ounces water daily.    Weight /BMI 08/17/2018 03/11/2018 03/08/2018  WEIGHT 165 lb 163 lb 14.4 oz 166 lb  HEIGHT 5\' 3"  5\' 5"  5\' 5"   BMI 29.23 kg/m2 27.27 kg/m2 27.62 kg/m2

## 2018-08-22 NOTE — Assessment & Plan Note (Signed)
Uncontrolled add amlodipine 2.5 mg daily, with f/u in office in 5 weeks DASH diet and commitment to daily physical activity for a minimum of 30 minutes discussed and encouraged, as a part of hypertension management. The importance of attaining a healthy weight is also discussed.  BP/Weight 08/17/2018 03/11/2018 03/08/2018 02/18/2018 08/25/2017 03/24/2017 69/10/9478  Systolic BP 165 537 482 707 867 544 920  Diastolic BP 80 80 86 91 84 82 82  Wt. (Lbs) 165 163.9 166 165 165 160 159.3  BMI 29.23 27.27 27.62 27.46 27.46 26.63 26.51

## 2018-08-22 NOTE — Assessment & Plan Note (Signed)
Updated lab needed at/ before next visit.   

## 2018-08-22 NOTE — Assessment & Plan Note (Addendum)
Reports increased stress and anxiety with need to remain at home, discussed and encouraged ' coping strategies, which she will begin, also  will send toolkit for her review Counseled re stress management for 5 minutes

## 2018-08-25 ENCOUNTER — Ambulatory Visit (INDEPENDENT_AMBULATORY_CARE_PROVIDER_SITE_OTHER): Payer: Medicare Other | Admitting: Psychiatry

## 2018-08-25 ENCOUNTER — Other Ambulatory Visit: Payer: Self-pay

## 2018-08-25 DIAGNOSIS — F32A Depression, unspecified: Secondary | ICD-10-CM

## 2018-08-25 DIAGNOSIS — F329 Major depressive disorder, single episode, unspecified: Secondary | ICD-10-CM

## 2018-08-25 NOTE — Progress Notes (Signed)
Virtual Visit via Telephone Note  I connected with [name] on 08/25/18 at 1:15 PM EDT by telephone and verified that I am speaking with the correct person using two identifiers.   I discussed the limitations, risks, security and privacy concerns of performing an evaluation and management service by telephone and the availability of in person appointments. I also discussed with the patient that there may be a patient responsible charge related to this service. The patient expressed understanding and agreed to proceed.   I provided 40 minutes of non-face-to-face time during this encounter.   Alonza Smoker, LCSW    THERAPIST PROGRESS NOTE  Session Time: Wednesday 08/25/2018 1:15 PM - 1:55 PM                 Participation Level: Active  Behavioral Response: Alert/Anxious  Type of There  apy: Individual Therapy  Treatment Goals addressed: learn and implement cognitive and behavioral strategies to cope with depression/anxiety  Interventions: Supportive/CBT  Summary: [name] is a 78 y.o. female who presents with history of symptoms of depression that began in 2001 when patient moved back to [place] from [place] to take care of my mother who has dementia. She reports no psychiatric hospitalization. Patient participated in outpatient therapy at [facility] and Families for about a year about 3 months ago. Patient reports taking an antidepressant as prescribed by PCP for a brief period but it made her sleepy. Symptoms have  included depressed mood, irritability, poor sleep patterns, fatigue, poor concentration, excessive worrying, loss of interest in activities, anxiety.  Patient last was seen about 5-6 weeks ago. She reports increased stress and anxiety related to impact of coronavirus pandemic. She reports several acquaintances and her cousin have died. She also worries about fellow board member's parents who both have been diagnosed with COVID 60. She expresses frustration she is not able to reach  out and support others like she is accustomed to doing. She is maintaining contact with groups she belongs to via conference calls. She also reports maintaining contact with her children via calls and FaceTime.    Suicidal/Homicidal: Nowithout intent/plan  Therapist Response: Discussed stressors, facilitated expression of thoughts and feelings, validated feelings and normalized feelings related to changes and grief/loss issues, assisted patient identify realistic expectations of self and her ability to provide support, helped patient explore ways to show support  (calls, cards, texts, coordinating with other members of her circle to provide group support), assisted patient identify ways to improve self care through identifying and participating in pleasurable activities (use adult coloring book, gardening), also assisted patient develop plan to increase physical activity by walking 15-20 minutes 3 x per week   Plan: Return again in 2-3 weeks.  Diagnosis: Axis I: Depressive Disorder     Axis II: No diagnosis    Alonza Smoker, LCSW 08/25/2018

## 2018-09-08 ENCOUNTER — Ambulatory Visit (INDEPENDENT_AMBULATORY_CARE_PROVIDER_SITE_OTHER): Payer: Medicare Other | Admitting: Psychiatry

## 2018-09-08 ENCOUNTER — Other Ambulatory Visit: Payer: Self-pay

## 2018-09-08 DIAGNOSIS — F32A Depression, unspecified: Secondary | ICD-10-CM

## 2018-09-08 DIAGNOSIS — F329 Major depressive disorder, single episode, unspecified: Secondary | ICD-10-CM

## 2018-09-08 NOTE — Progress Notes (Signed)
Virtual Visit via Video Note  I connected with Yolanda Foley on 09/08/18 at 1:15 PM EDT by a video enabled telemedicine application and verified that I am speaking with the correct person using two identifiers.   I discussed the limitations of evaluation and management by telemedicine and the availability of in person appointments. The patient expressed understanding and agreed to proceed.  I provided 35 minutes of non-face-to-face time during this encounter.   Yolanda Smoker, LCSW     THERAPIST PROGRESS NOTE  Session Time: Wednesday 09/08/2018 1:15 PM - 1:50 PM              Participation Level: Active  Behavioral Response: Alert/Anxious  Type of There  apy: Individual Therapy  Treatment Goals addressed: learn and implement cognitive and behavioral strategies to cope with depression/anxiety  Interventions: Supportive/CBT  Summary: Yolanda Foley is a 78 y.o. female who presents with history of symptoms of depression that began in 2001 when patient moved back to [place] from [place] to take care of my mother who has dementia. She reports no psychiatric hospitalization. Patient participated in outpatient therapy at [facility] and Families for about a year about 3 months ago. Patient reports taking an antidepressant as prescribed by PCP for a brief period but it made her sleepy. Symptoms have  included depressed mood, irritability, poor sleep patterns, fatigue, poor concentration, excessive worrying, loss of interest in activities, anxiety.  Patient last was seen about 2 weeks ago. She reports continued stress and anxiety related to impact of coronavirus pandemic. She states doing something all the time and being tired. 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 She complains she has little to no time for self. She also expresses worry about possibly contracting the virus and how long it will take for things to return to normal. She has maintained social  contact via calls, conference calls, and Face Time. She is pleased her church has begun having services on Wednesdays and Sundays via conference calls and she has reconnected with church members. She  has been walking on treadmill and outside. She expresses less worry about providing support to others as she has used strategies discussed in last session and says these have been helpful. Suicidal/Homicidal: Nowithout intent/plan  Therapist Response: Praised and reinforced patient's increased self-care regarding exercise,diiscussed stressors, facilitated expression of thoughts and feelings, validated feelings and normalized feelings related to impact of coronavirus, assisted patient identify her worries,  discussed real worries versus hypothetical worries, assisted patient identify worries she can do something about and reviewed precautions to take, (wearing mask, keeping hand sanitizer in car when traveling and as a reminder to use, social distancing) assisted patient examine thought patterns regarding defining a meal and replace with more helpful thought patterns, assisted patient explore options regarding minimizing stress associated with cooking ( cook enough for 2-3 meals and use leftovers), assisted patient examine thought patterns about time for self and replace with helpful thought to assist patient prioritize time for self, develop plan with patient to take a drive for self to get out of the house and do some of her exercises on her deck   Plan: Return again in 2-3 weeks.  Diagnosis: Axis I: Depressive Disorder     Axis II: No diagnosis    Yolanda Smoker, LCSW 09/08/2018

## 2018-09-22 ENCOUNTER — Ambulatory Visit: Payer: Self-pay | Admitting: Family Medicine

## 2018-09-24 DIAGNOSIS — E782 Mixed hyperlipidemia: Secondary | ICD-10-CM | POA: Diagnosis not present

## 2018-09-24 DIAGNOSIS — I10 Essential (primary) hypertension: Secondary | ICD-10-CM | POA: Diagnosis not present

## 2018-09-24 DIAGNOSIS — E663 Overweight: Secondary | ICD-10-CM | POA: Diagnosis not present

## 2018-09-25 LAB — COMPLETE METABOLIC PANEL WITH GFR
AG Ratio: 0.9 (calc) — ABNORMAL LOW (ref 1.0–2.5)
ALT: 22 U/L (ref 6–29)
AST: 34 U/L (ref 10–35)
Albumin: 3.9 g/dL (ref 3.6–5.1)
Alkaline phosphatase (APISO): 157 U/L — ABNORMAL HIGH (ref 37–153)
BUN: 12 mg/dL (ref 7–25)
CO2: 25 mmol/L (ref 20–32)
Calcium: 9 mg/dL (ref 8.6–10.4)
Chloride: 103 mmol/L (ref 98–110)
Creat: 0.82 mg/dL (ref 0.60–0.93)
GFR, Est African American: 79 mL/min/{1.73_m2} (ref >=60)
GFR, Est Non African American: 68 mL/min/{1.73_m2} (ref >=60)
Globulin: 4.2 g/dL (calc) — ABNORMAL HIGH (ref 1.9–3.7)
Glucose, Bld: 85 mg/dL (ref 65–99)
Potassium: 4 mmol/L (ref 3.5–5.3)
Sodium: 138 mmol/L (ref 135–146)
Total Bilirubin: 0.5 mg/dL (ref 0.2–1.2)
Total Protein: 8.1 g/dL (ref 6.1–8.1)

## 2018-09-25 LAB — CBC
HCT: 32.3 % — ABNORMAL LOW (ref 35.0–45.0)
Hemoglobin: 10.7 g/dL — ABNORMAL LOW (ref 11.7–15.5)
MCH: 29.4 pg (ref 27.0–33.0)
MCHC: 33.1 g/dL (ref 32.0–36.0)
MCV: 88.7 fL (ref 80.0–100.0)
MPV: 13.6 fL — ABNORMAL HIGH (ref 7.5–12.5)
Platelets: 133 10*3/uL — ABNORMAL LOW (ref 140–400)
RBC: 3.64 10*6/uL — ABNORMAL LOW (ref 3.80–5.10)
RDW: 13 % (ref 11.0–15.0)
WBC: 4 10*3/uL (ref 3.8–10.8)

## 2018-09-25 LAB — LIPID PANEL
Cholesterol: 197 mg/dL (ref <200)
HDL: 57 mg/dL (ref >=50)
LDL Cholesterol (Calc): 124 mg/dL (calc) — ABNORMAL HIGH
Non-HDL Cholesterol (Calc): 140 mg/dL (calc) — ABNORMAL HIGH (ref <130)
Total CHOL/HDL Ratio: 3.5 (calc) (ref <5.0)
Triglycerides: 72 mg/dL (ref <150)

## 2018-09-25 LAB — TSH: TSH: 1.08 mIU/L (ref 0.40–4.50)

## 2018-09-29 ENCOUNTER — Encounter: Payer: Self-pay | Admitting: Family Medicine

## 2018-09-29 ENCOUNTER — Encounter (INDEPENDENT_AMBULATORY_CARE_PROVIDER_SITE_OTHER): Payer: Self-pay

## 2018-09-29 ENCOUNTER — Other Ambulatory Visit: Payer: Self-pay

## 2018-09-29 ENCOUNTER — Ambulatory Visit (INDEPENDENT_AMBULATORY_CARE_PROVIDER_SITE_OTHER): Payer: Medicare Other | Admitting: Family Medicine

## 2018-09-29 VITALS — BP 138/80 | HR 102 | Temp 97.8°F | Resp 15 | Ht 63.0 in | Wt 168.0 lb

## 2018-09-29 DIAGNOSIS — Z1231 Encounter for screening mammogram for malignant neoplasm of breast: Secondary | ICD-10-CM | POA: Diagnosis not present

## 2018-09-29 DIAGNOSIS — E663 Overweight: Secondary | ICD-10-CM

## 2018-09-29 DIAGNOSIS — E7849 Other hyperlipidemia: Secondary | ICD-10-CM | POA: Diagnosis not present

## 2018-09-29 DIAGNOSIS — R42 Dizziness and giddiness: Secondary | ICD-10-CM

## 2018-09-29 DIAGNOSIS — I10 Essential (primary) hypertension: Secondary | ICD-10-CM | POA: Diagnosis not present

## 2018-09-29 DIAGNOSIS — F4329 Adjustment disorder with other symptoms: Secondary | ICD-10-CM

## 2018-09-29 NOTE — Patient Instructions (Addendum)
F/U in 5 months, call if you need me before  Blood pressure is excellent  Please contact and continue with therapist   Enjoy gardening and make time to read books    Mammogram will be scheduled for November when due   It is important that you exercise regularly at least 30 minutes 5 times a week. If you develop chest pain, have severe difficulty breathing, or feel very tired, stop exercising immediately and seek medical attention    Thanks for choosing [facility], we consider it a privelige to serve you.

## 2018-10-14 ENCOUNTER — Encounter: Payer: Self-pay | Admitting: Family Medicine

## 2018-10-14 DIAGNOSIS — R42 Dizziness and giddiness: Secondary | ICD-10-CM | POA: Insufficient documentation

## 2018-10-14 NOTE — Assessment & Plan Note (Signed)
Increased stress with Covid 19, recommend returning to therapy, states her spouse is becoming increasingly irritating

## 2018-10-14 NOTE — Assessment & Plan Note (Signed)
Educated pt re need to change position slowly to reduce symptom

## 2018-10-14 NOTE — Assessment & Plan Note (Signed)
  Patient re-educated about  the importance of commitment to a  minimum of 150 minutes of exercise per week as able.  The importance of healthy food choices with portion control discussed, as well as eating regularly and within a 12 hour window most days. The need to choose "clean , green" food 50 to 75% of the time is discussed, as well as to make water the primary drink and set a goal of 64 ounces water daily.    Weight /BMI 09/29/2018 08/17/2018 03/11/2018  WEIGHT 168 lb 165 lb 163 lb 14.4 oz  HEIGHT 5\' 3"  5\' 3"  5\' 5"   BMI 29.76 kg/m2 29.23 kg/m2 27.27 kg/m2

## 2018-10-14 NOTE — Assessment & Plan Note (Signed)
Hyperlipidemia:Low fat diet discussed and encouraged.   Lipid Panel  Lab Results  Component Value Date   CHOL 197 09/24/2018   HDL 57 09/24/2018   LDLCALC 124 (H) 09/24/2018   TRIG 72 09/24/2018   CHOLHDL 3.5 09/24/2018   Needs to reduce fried and fatty foods

## 2018-10-14 NOTE — Assessment & Plan Note (Signed)
Controlled, no change in medication DASH diet and commitment to daily physical activity for a minimum of 30 minutes discussed and encouraged, as a part of hypertension management. The importance of attaining a healthy weight is also discussed.  BP/Weight 09/29/2018 08/17/2018 03/11/2018 03/08/2018 02/18/2018 08/25/2017 37/29/0211  Systolic BP 155 208 022 336 122 449 753  Diastolic BP 80 80 80 86 91 84 82  Wt. (Lbs) 168 165 163.9 166 165 165 160  BMI 29.76 29.23 27.27 27.62 27.46 27.46 26.63

## 2018-10-14 NOTE — Progress Notes (Signed)
Yolanda Foley     MRN: [id number]      DOB: 1940/12/08   HPI Yolanda Foley is here for follow up and re-evaluation of chronic medical conditions, medication management and review of any available recent lab and radiology data.  Preventive health is updated, specifically  Cancer screening and Immunization.   Questions or concerns regarding consultations or procedures which the PT has had in the interim are  addressed. The PT denies any adverse reactions to current medications since the last visit.  C/o intermittent light headedness and dizziness , mainly with change in position , for monhts ROS Denies recent fever or chills. Denies sinus pressure, nasal congestion, ear pain or sore throat. Denies chest congestion, productive cough or wheezing. Denies chest pains, palpitations and leg swelling Denies abdominal pain, nausea, vomiting,diarrhea or constipation.   Denies dysuria, frequency, hesitancy or incontinence. Denies uncontrolled  joint pain, swelling and limitation in mobility. Denies headaches, seizures, numbness, or tingling. C/o increased depression, anxiety and  Insomnia.due to lockdown,, not suicidal or homicidal Denies skin break down or rash.   PE  BP 138/80   Pulse (!) 102   Temp 97.8 F (36.6 C) (Temporal)   Resp 15   Ht 5\' 3"  (1.6 m)   Wt 168 lb (76.2 kg)   SpO2 97%   BMI 29.76 kg/m   Patient alert and oriented and in no cardiopulmonary distress.  HEENT: No facial asymmetry, EOMI,   oropharynx pink and moist.  Neck supple no JVD, no mass.  Chest: Clear to auscultation bilaterally.  CVS: S1, S2 no murmurs, no S3.Regular rate.  ABD: Soft non tender.   Ext: No edema  MS: Adequate though reduced  ROM spine, shoulders, hips and knees.  Skin: Intact, no ulcerations or rash noted.  Psych: Good eye contact, normal affect. Memory intact not anxious or depressed appearing.  CNS: CN 2-12 intact, power,  normal throughout.no focal deficits noted.   Assessment &  Plan  Essential hypertension Controlled, no change in medication DASH diet and commitment to daily physical activity for a minimum of 30 minutes discussed and encouraged, as a part of hypertension management. The importance of attaining a healthy weight is also discussed.  BP/Weight 09/29/2018 08/17/2018 03/11/2018 03/08/2018 02/18/2018 08/25/2017 17/79/3903  Systolic BP 009 233 007 622 633 354 562  Diastolic BP 80 80 80 86 91 84 82  Wt. (Lbs) 168 165 163.9 166 165 165 160  BMI 29.76 29.23 27.27 27.62 27.46 27.46 26.63       Hyperlipemia Hyperlipidemia:Low fat diet discussed and encouraged.   Lipid Panel  Lab Results  Component Value Date   CHOL 197 09/24/2018   HDL 57 09/24/2018   LDLCALC 124 (H) 09/24/2018   TRIG 72 09/24/2018   CHOLHDL 3.5 09/24/2018   Needs to reduce fried and fatty foods    Overweight (BMI 25.0-29.9)  Patient re-educated about  the importance of commitment to a  minimum of 150 minutes of exercise per week as able.  The importance of healthy food choices with portion control discussed, as well as eating regularly and within a 12 hour window most days. The need to choose "clean , green" food 50 to 75% of the time is discussed, as well as to make water the primary drink and set a goal of 64 ounces water daily.    Weight /BMI 09/29/2018 08/17/2018 03/11/2018  WEIGHT 168 lb 165 lb 163 lb 14.4 oz  HEIGHT 5\' 3"  5\' 3"  5\' 5"   BMI  29.76 kg/m2 29.23 kg/m2 27.27 kg/m2      Stress and adjustment reaction Increased stress with Covid 19, recommend returning to therapy, states her spouse is becoming increasingly irritating  Light headedness Educated pt re need to change position slowly to reduce symptom

## 2019-02-24 ENCOUNTER — Ambulatory Visit: Payer: Self-pay

## 2019-02-25 ENCOUNTER — Encounter: Payer: Self-pay | Admitting: Family Medicine

## 2019-02-25 ENCOUNTER — Other Ambulatory Visit: Payer: Self-pay

## 2019-02-25 ENCOUNTER — Ambulatory Visit (INDEPENDENT_AMBULATORY_CARE_PROVIDER_SITE_OTHER): Payer: Medicare Other | Admitting: Family Medicine

## 2019-02-25 VITALS — BP 156/80 | Temp 98.1°F | Resp 15 | Ht 65.0 in | Wt 163.0 lb

## 2019-02-25 DIAGNOSIS — Z0000 Encounter for general adult medical examination without abnormal findings: Secondary | ICD-10-CM

## 2019-02-25 NOTE — Progress Notes (Signed)
Subjective:   Yolanda Foley is a 78 y.o. female who presents for Medicare Annual (Subsequent) preventive examination.  Location of Patient: Home Location of Provider: Telehealth Consent was obtain for visit to be over via telehealth.  I verified that I am speaking with the correct person using two identifiers.   Review of Systems:   Cardiac Risk Factors include: advanced age (>9men, >37 women);hypertension     Objective:     Vitals: BP (!) 156/80 Comment: home reading  Temp 98.1 F (36.7 C)   Resp 15   Ht 5\' 5"  (1.651 m)   Wt 163 lb (73.9 kg)   BMI 27.12 kg/m   Body mass index is 27.12 kg/m.  Advanced Directives 02/25/2019 02/18/2018 06/05/2016  Does Patient Have a Medical Advance Directive? No No No  Would patient like information on creating a medical advance directive? - Yes (ED - Information included in AVS) Yes (MAU/Ambulatory/Procedural Areas - Information given)    Tobacco Social History   Tobacco Use  Smoking Status Never Smoker  Smokeless Tobacco Never Used     Counseling given: Not Answered   Clinical Intake:  Pre-visit preparation completed: No  Pain : No/denies pain     Nutritional Status: BMI 25 -29 Overweight Diabetes: No  How often do you need to have someone help you when you read instructions, pamphlets, or other written materials from your doctor or pharmacy?: 1 - Never What is the last grade level you completed in school?: 16  Interpreter Needed?: No     Past Medical History:  Diagnosis Date  . Cholelithiasis   . Chronic pain   . Depression   . Elevated liver enzymes   . GERD (gastroesophageal reflux disease)   . Hyperlipidemia   . Hypertension   . Nonspecific elevation of levels of transaminase or lactic acid dehydrogenase (LDH)   . Palpitation   . 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 correction of nasal surgery  35 years ago   . Left ovarian cyst removal  1974  .  ROTATOR CUFF REPAIR Left 08/2016  . TONSILLECTOMY  1967    Family History  Problem Relation Age of Onset  . Dementia Mother   . Heart failure Father   . Hypertension Father   . Cancer Brother 36       kidney  . Cancer Maternal Aunt        breast  . Dementia Maternal Aunt    Social History   Socioeconomic History  . Marital status: Married    Spouse name: Not on file  . Number of children: 4  . Years of education: 27  . Highest education level: Associate degree: academic program  Occupational History  . Occupation: retired   Scientific laboratory technician  . Financial resource strain: Not hard at all  . Food insecurity    Worry: Never true    Inability: Never true  . Transportation needs    Medical: No    Non-medical: No  Tobacco Use  . Smoking status: Never Smoker  . Smokeless tobacco: Never Used  Substance and Sexual Activity  . Alcohol use: No  . Drug use: No  . Sexual activity: Not Currently  Lifestyle  . Physical activity    Days per week: 2 days    Minutes per session: 50 min  . Stress: Rather much  Relationships  . Social connections    Talks on phone: Once a week  Gets together: Once a week    Attends religious service: 1 to 4 times per year    Active member of club or organization: Yes    Attends meetings of clubs or organizations: 1 to 4 times per year    Relationship status: Married  Other Topics Concern  . Not on file  Social History Narrative   Expressed to this nurse her frustration with her husband his controlling ways.    Outpatient Encounter Medications as of 02/25/2019  Medication Sig  . amLODipine (NORVASC) 2.5 MG tablet Take 1 tablet (2.5 mg total) by mouth daily.  Marland Kitchen aspirin (ASPIRIN LOW DOSE) 81 MG EC tablet Take 81 mg by mouth as needed. Take one tablet by mouth once a day  . Chlorophyll 50 MG CAPS Take 3 capsules by mouth daily.  . Coenzyme Q10-Fish Oil-Vit E (CO-Q 10 OMEGA-3 FISH OIL) CAPS Take 2 capsules by mouth daily.  . Digestive Enzymes  (PAPAYA ENZYME) CHEW Chew by mouth as needed.  . Fish Oil-Krill Oil (KRILL OIL PLUS) CAPS Take 1 capsule by mouth daily.  Nyoka Cowden Tea, Camillia sinensis, (GREEN TEA PO) Take by mouth. Patient states that this is a decaf tea and she drinks it 1-2 times daily  . IRON CR PO Take 80 mg by mouth daily.  Marland Kitchen MACA ROOT PO Take 523 mg by mouth.  . Methylsulfonylmethane (MSM) 500 MG CAPS Take 1 capsule by mouth daily.   . Milk Thistle 200 MG CAPS Take 1 capsule by mouth 2 (two) times daily.  . Multiple Vitamin (MULTIVITAMIN) tablet Take 1 tablet by mouth daily. With iron  . olmesartan (BENICAR) 40 MG tablet Take 1 tablet (40 mg total) by mouth daily.  . ursodiol (ACTIGALL) 300 MG capsule TAKE 1 CAPSULE BY MOUTH THREE TIMES DAILY AFTER A MEAL  . vitamin B-12 (CYANOCOBALAMIN) 1000 MCG tablet Take 1,000 mcg by mouth daily.  . vitamin C (ASCORBIC ACID) 500 MG tablet Take 500 mg by mouth 2 (two) times daily.   No facility-administered encounter medications on file as of 02/25/2019.     Activities of Daily Living In your present state of health, do you have any difficulty performing the following activities: 02/25/2019  Hearing? Y  Comment some, has hearing  Vision? N  Difficulty concentrating or making decisions? Y  Comment at times  Walking or climbing stairs? N  Dressing or bathing? N  Doing errands, shopping? N  Some recent data might be hidden    Patient Care Team: Fayrene Helper, MD as PCP - General Leta Baptist, MD as Attending Physician (Otolaryngology) Rogene Houston, MD as Attending Physician (Gastroenterology) Leta Baptist, MD as Consulting Physician (Otolaryngology) Rogene Houston, MD as Consulting Physician (Gastroenterology)    Assessment:   This is a routine wellness examination for [name].  Exercise Activities and Dietary recommendations Current Exercise Habits: The patient does not participate in regular exercise at present, Exercise limited by: cardiac condition(s)  Goals     . Exercise 3x per week (30 min per time)     Recommend increasing silver sneakers at least 3 times a week, or walking an additional day.    . Patient Stated     See her counselor more often and learn to deal with emotional stress her husband puts her under        [place]  02/25/2019 09/29/2018 08/17/2018 03/08/2018 02/18/2018  Falls in the past year? 0 0 0 0 No  Number falls in past  yr: 0 0 - - -  Injury with Fall? 0 0 0 - -  Risk for fall due to : - - - - Medication side effect;Impaired vision  Follow up Falls evaluation completed;Education provided - - - -   Is the patient's home free of loose throw rugs in walkways, pet beds, electrical cords, etc?   yes      Grab bars in the bathroom? yes      Handrails on the stairs?   yes      Adequate lighting?   yes   Depression Screen PHQ 2/9 Scores 02/25/2019 09/29/2018 08/17/2018 03/08/2018  PHQ - 2 Score 0 1 1 1   PHQ- 9 Score - - - 6  Some encounter information is confidential and restricted. Go to Review Flowsheets activity to see all data.     Cognitive Function     6CIT Screen 02/25/2019 02/18/2018 06/05/2016  What Year? 0 points 0 points 0 points  What month? 0 points 0 points 0 points  What time? 0 points 0 points 0 points  Count back from 20 0 points 0 points 0 points  Months in reverse 0 points 0 points 0 points  Repeat phrase 0 points 2 points 0 points  Total Score 0 2 0    Immunization History  Administered Date(s) Administered  . Influenza Split 02/05/2011  . Influenza Whole 01/21/2006, 05/31/2007, 02/25/2010  . Influenza,inj,Quad PF,6+ Mos 03/15/2013  . Pneumococcal Conjugate-13 05/30/2015  . Pneumococcal Polysaccharide-23 08/16/2008  . Td 08/16/2008    Qualifies for Shingles Vaccine?  Checking coverage  Screening Tests Health Maintenance  Topic Date Due  . TETANUS/TDAP  08/17/2018  . INFLUENZA VACCINE  07/27/2019 (Originally 11/27/2018)  . DEXA SCAN  Completed  . PNA vac Low Risk Adult   Completed    Cancer Screenings: Lung: Low Dose CT Chest recommended if Age 41-80 years, 30 pack-year currently smoking OR have quit w/in 15years. Patient does not qualify. Breast:  Up to date on Mammogram? Yes   Up to date of Bone Density/Dexa? Yes Colorectal: Prefers to have Cologuard 1 more time  Additional Screenings: : Hepatitis C Screening: Can be added to next lab     Plan:      1. Encounter for Medicare annual wellness exam   I have personally reviewed and noted the following in the patient's chart:   . Medical and social history . Use of alcohol, tobacco or illicit drugs  . Current medications and supplements . Functional ability and status . Nutritional status . Physical activity . Advanced directives . List of other physicians . Hospitalizations, surgeries, and ER visits in previous 12 months . Vitals . Screenings to include cognitive, depression, and falls . Referrals and appointments  In addition, I have reviewed and discussed with patient certain preventive protocols, quality metrics, and best practice recommendations. A written personalized care plan for preventive services as well as general preventive health recommendations were provided to patient.     I provided 20 minutes of non-face-to-face time during this encounter.   Perlie Mayo, NP  02/25/2019

## 2019-02-25 NOTE — Patient Instructions (Addendum)
Yolanda Foley , Thank you for taking time to come for your Medicare Wellness Visit. I appreciate your ongoing commitment to your health goals. Please review the following plan we discussed and let me know if I can assist you in the future.   Please continue to practice social distancing to keep you, your family, and our community safe.  If you must go out, please wear a Mask and practice good handwashing.  If blood pressure does not get better please call the office and let us know so we can work your appointment sooner.  Screening recommendations/referrals: Colonoscopy: Discussed Cologuard Mammogram: Set up mammogram Bone Density: Up-to-date please continue calcium and vitamin D Recommended yearly ophthalmology/optometry visit for glaucoma screening and checkup Recommended yearly dental visit for hygiene and checkup  Vaccinations: Influenza vaccine: Please consider getting your flu vaccine discuss at next appointment Pneumococcal vaccine: Up-to-date Tdap vaccine: Due 2020 Shingles vaccine: Check coverage with insurance  Advanced directives: Consider doing this paperwork if you need help or having questions let us know  Conditions/risks identified: Falls, make sure you move slowly and watch for a step  Next appointment: 03/15/2019    Preventive Care 80 Years and Older, Female Preventive care refers to lifestyle choices and visits with your health care provider that can promote health and wellness. What does preventive care include?  A yearly physical exam. This is also called an annual well check.  Dental exams once or twice a year.  Routine eye exams. Ask your health care provider how often you should have your eyes checked.  Personal lifestyle choices, including:  Daily care of your teeth and gums.  Regular physical activity.  Eating a healthy diet.  Avoiding tobacco and drug use.  Limiting alcohol use.  Practicing safe sex.  Taking low-dose aspirin every day.   Taking vitamin and mineral supplements as recommended by your health care provider. What happens during an annual well check? The services and screenings done by your health care provider during your annual well check will depend on your age, overall health, lifestyle risk factors, and family history of disease. Counseling  Your health care provider may ask you questions about your:  Alcohol use.  Tobacco use.  Drug use.  Emotional well-being.  Home and relationship well-being.  Sexual activity.  Eating habits.  History of falls.  Memory and ability to understand (cognition).  Work and work Statistician.  Reproductive health. Screening  You may have the following tests or measurements:  Height, weight, and BMI.  Blood pressure.  Lipid and cholesterol levels. These may be checked every 5 years, or more frequently if you are over 80 years old.  Skin check.  Lung cancer screening. You may have this screening every year starting at age 800 if you have a 30-pack-year history of smoking and currently smoke or have quit within the past 15 years.  Fecal occult blood test (FOBT) of the stool. You may have this test every year starting at age 80.  Flexible sigmoidoscopy or colonoscopy. You may have a sigmoidoscopy every 5 years or a colonoscopy every 10 years starting at age 800.  Hepatitis C blood test.  Hepatitis B blood test.  Sexually transmitted disease (STD) testing.  Diabetes screening. This is done by checking your blood sugar (glucose) after you have not eaten for a while (fasting). You may have this done every 1-3 years.  Bone density scan. This is done to screen for osteoporosis. You may have this done starting at age 800.  Mammogram. This may be done every 1-2 years. Talk to your health care provider about how often you should have regular mammograms. Talk with your health care provider about your test results, treatment options, and if necessary, the need for  more tests. Vaccines  Your health care provider may recommend certain vaccines, such as:  Influenza vaccine. This is recommended every year.  Tetanus, diphtheria, and acellular pertussis (Tdap, Td) vaccine. You may need a Td booster every 10 years.  Zoster vaccine. You may need this after age 10.  Pneumococcal 13-valent conjugate (PCV13) vaccine. One dose is recommended after age 25.  Pneumococcal polysaccharide (PPSV23) vaccine. One dose is recommended after age 67. Talk to your health care provider about which screenings and vaccines you need and how often you need them. This information is not intended to replace advice given to you by your health care provider. Make sure you discuss any questions you have with your health care provider. Document Released: 05/11/2015 Document Revised: 01/02/2016 Document Reviewed: 02/13/2015 Elsevier Interactive Patient Education  2017 [place] Prevention in the Home Falls can cause injuries. They can happen to people of all ages. There are many things you can do to make your home safe and to help prevent falls. What can I do on the outside of my home?  Regularly fix the edges of walkways and driveways and fix any cracks.  Remove anything that might make you trip as you walk through a door, such as a raised step or threshold.  Trim any bushes or trees on the path to your home.  Use bright outdoor lighting.  Clear any walking paths of anything that might make someone trip, such as rocks or tools.  Regularly check to see if handrails are loose or broken. Make sure that both sides of any steps have handrails.  Any raised decks and porches should have guardrails on the edges.  Have any leaves, snow, or ice cleared regularly.  Use sand or salt on walking paths during winter.  Clean up any spills in your garage right away. This includes oil or grease spills. What can I do in the bathroom?  Use night lights.  Install grab bars by  the toilet and in the tub and shower. Do not use towel bars as grab bars.  Use non-skid mats or decals in the tub or shower.  If you need to sit down in the shower, use a plastic, non-slip stool.  Keep the floor dry. Clean up any water that spills on the floor as soon as it happens.  Remove soap buildup in the tub or shower regularly.  Attach bath mats securely with double-sided non-slip rug tape.  Do not have throw rugs and other things on the floor that can make you trip. What can I do in the bedroom?  Use night lights.  Make sure that you have a light by your bed that is easy to reach.  Do not use any sheets or blankets that are too big for your bed. They should not hang down onto the floor.  Have a firm chair that has side arms. You can use this for support while you get dressed.  Do not have throw rugs and other things on the floor that can make you trip. What can I do in the kitchen?  Clean up any spills right away.  Avoid walking on wet floors.  Keep items that you use a lot in easy-to-reach places.  If you need to reach  something above you, use a strong step stool that has a grab bar.  Keep electrical cords out of the way.  Do not use floor polish or wax that makes floors slippery. If you must use wax, use non-skid floor wax.  Do not have throw rugs and other things on the floor that can make you trip. What can I do with my stairs?  Do not leave any items on the stairs.  Make sure that there are handrails on both sides of the stairs and use them. Fix handrails that are broken or loose. Make sure that handrails are as long as the stairways.  Check any carpeting to make sure that it is firmly attached to the stairs. Fix any carpet that is loose or worn.  Avoid having throw rugs at the top or bottom of the stairs. If you do have throw rugs, attach them to the floor with carpet tape.  Make sure that you have a light switch at the top of the stairs and the bottom  of the stairs. If you do not have them, ask someone to add them for you. What else can I do to help prevent falls?  Wear shoes that:  Do not have high heels.  Have rubber bottoms.  Are comfortable and fit you well.  Are closed at the toe. Do not wear sandals.  If you use a stepladder:  Make sure that it is fully opened. Do not climb a closed stepladder.  Make sure that both sides of the stepladder are locked into place.  Ask someone to hold it for you, if possible.  Clearly mark and make sure that you can see:  Any grab bars or handrails.  First and last steps.  Where the edge of each step is.  Use tools that help you move around (mobility aids) if they are needed. These include:  Canes.  Walkers.  Scooters.  Crutches.  Turn on the lights when you go into a dark area. Replace any light bulbs as soon as they burn out.  Set up your furniture so you have a clear path. Avoid moving your furniture around.  If any of your floors are uneven, fix them.  If there are any pets around you, be aware of where they are.  Review your medicines with your doctor. Some medicines can make you feel dizzy. This can increase your chance of falling. Ask your doctor what other things that you can do to help prevent falls. This information is not intended to replace advice given to you by your health care provider. Make sure you discuss any questions you have with your health care provider. Document Released: 02/08/2009 Document Revised: 09/20/2015 Document Reviewed: 05/19/2014 Elsevier Interactive Patient Education  2017 [employer].

## 2019-03-15 ENCOUNTER — Ambulatory Visit (INDEPENDENT_AMBULATORY_CARE_PROVIDER_SITE_OTHER): Payer: Medicare Other | Admitting: Family Medicine

## 2019-03-15 ENCOUNTER — Encounter: Payer: Self-pay | Admitting: Family Medicine

## 2019-03-15 ENCOUNTER — Other Ambulatory Visit: Payer: Self-pay

## 2019-03-15 VITALS — BP 138/82 | Ht 65.0 in | Wt 163.0 lb

## 2019-03-15 DIAGNOSIS — K743 Primary biliary cirrhosis: Secondary | ICD-10-CM

## 2019-03-15 DIAGNOSIS — D539 Nutritional anemia, unspecified: Secondary | ICD-10-CM

## 2019-03-15 DIAGNOSIS — E7849 Other hyperlipidemia: Secondary | ICD-10-CM | POA: Diagnosis not present

## 2019-03-15 DIAGNOSIS — F5104 Psychophysiologic insomnia: Secondary | ICD-10-CM

## 2019-03-15 DIAGNOSIS — F320 Major depressive disorder, single episode, mild: Secondary | ICD-10-CM

## 2019-03-15 DIAGNOSIS — I10 Essential (primary) hypertension: Secondary | ICD-10-CM | POA: Diagnosis not present

## 2019-03-15 DIAGNOSIS — D649 Anemia, unspecified: Secondary | ICD-10-CM | POA: Diagnosis not present

## 2019-03-15 DIAGNOSIS — Z1211 Encounter for screening for malignant neoplasm of colon: Secondary | ICD-10-CM

## 2019-03-15 NOTE — Progress Notes (Signed)
Virtual Visit via Telephone Note  I connected with Yolanda Foley on 03/15/19 at  9:40 AM EST by telephone and verified that I am speaking with the correct person using two identifiers.  Location: Patient: home Provider: office    I discussed the limitations, risks, security and privacy concerns of performing an evaluation and management service by telephone and the availability of in person appointments. I also discussed with the patient that there may be a patient responsible charge related to this service. The patient expressed understanding and agreed to proceed.   History of Present Illness: F/U chronic problems C/o poor sleep, difficulty both falling an staying asleep. Has missed therapy for several months which is detrimental, now understands sh can siot in her care for privacy whch she neds while speaking with Counsellor, so she will reach out again. Denies depression, and she remains very active Has questions about supplement and her liver , I have requested she directly reach out to her gI Doc Denies recent fever or chills. Denies sinus pressure, nasal congestion, ear pain or sore throat. Denies chest congestion, productive cough or wheezing. Denies chest pains, palpitations and leg swelling Denies abdominal pain, nausea, vomiting,diarrhea or constipation.   Denies dysuria, frequency, hesitancy or incontinence. Denies uncontrolled s joint pain, swelling and limitation in mobility. Denies headaches, seizures, numbness, or tingling. Denies depression, c/o  anxiety or insomnia. Denies skin break down or rash.       Observations/Objective: BP 138/82   Ht 5\' 5"  (1.651 m)   Wt 163 lb (73.9 kg)   BMI 27.12 kg/m   Good communication with no confusion and intact memory. Alert and oriented x 3 No signs of respiratory distress during speech    Assessment and Plan: Essential hypertension Controlled, no change in medication DASH diet and commitment to daily physical activity  for a minimum of 30 minutes discussed and encouraged, as a part of hypertension management. The importance of attaining a healthy weight is also discussed.  BP/Weight 03/15/2019 02/25/2019 09/29/2018 08/17/2018 03/11/2018 03/08/2018 123XX123  Systolic BP 0000000 [id number] 0000000 0000000 [id number]  Diastolic BP 82 80 80 80 80 86 91  Wt. (Lbs) 163 163 168 165 163.9 166 165  BMI 27.12 27.12 29.76 29.23 27.27 27.62 27.46       Insomnia Persistent, uncontrolled. Will try bedtime benadryl Needs to re establish with therapist  Hyperlipemia Hyperlipidemia:Low fat diet discussed and encouraged.   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 Questions re anemia and also re " safe supplements" will refer  Anemia GI to addres , specify if related to her GI tract, this is chronic, if not GI will have hematology eval  Follow Up Instructions:    I discussed the assessment and treatment plan with the patient. The patient was provided an opportunity to ask questions and all were answered. The patient agreed with the plan and demonstrated an understanding of the instructions.   The patient was advised to call back or seek an in-person evaluation if the symptoms worsen or if the condition fails to improve as anticipated.  I provided 3minutes of non-face-to-face time during this encounter.   Tula Nakayama, MD

## 2019-03-15 NOTE — Patient Instructions (Addendum)
F/U in 4.5 months, call if you need me before  I will  Let Dr. Malvin Johns know that you will re establish with her calling from your car  I will message Dr Laural Golden re your anemia to determine if we need hematology evaluation  Non fasting cmp and EGFr, CBC , and iron, ferritin , B12 and folate level and Vitamin D in the next week ( [facility])  Try OTC benadryl one at bedtime with good sleep hygiene to help with insomnia   Diphenhydramine capsules or tablets [Insomnia] What is this medicine? DIPHENHYDRAMINE (dye fen HYE dra meen) is an antihistamine. This medicine is used to treat occasional sleeplesness. This medicine may be used for other purposes; ask your health care provider or pharmacist if you have questions. COMMON BRAND NAME(S): Aid to Sleep, Compoz Nighttime Sleep Aid, Nighttime Sleep Aid, Nytol, Simply Sleep, Sleep Tabs, Sominex, Unisom, Vicks Qlearquil Nighttime Allergy Relief, Vicks ZzzQuil Nightime Sleep-Aid What should I tell my health care provider before I take this medicine? They need to know if you have any of these conditions:  glaucoma  high blood pressure or heart disease  liver disease  lung or breathing disease, like asthma  pain or trouble passing urine  prostate trouble  ulcers or other stomach problems  an unusual or allergic reaction to diphenhydramine, other medicines foods, dyes, or preservatives such as sulfites  pregnant or trying to get pregnant  breast-feeding How should I use this medicine? Take this medicine by mouth with a full glass of water. Follow the directions on the prescription label. Do not take your medicine more often than directed. Talk to your pediatrician regarding the use of this medicine in children. While this drug may be prescribed for children as young as 64 years old for selected conditions, precautions do apply. Patients over 66 years old may have a stronger reaction and need a smaller dose. Overdosage: If you think you have  taken too much of this medicine contact a poison control center or emergency room at once. NOTE: This medicine is only for you. Do not share this medicine with others. What if I miss a dose? If you miss a dose, take it as soon as you can. If it is almost time for your next dose, take only that dose. Do not take double or extra doses. What may interact with this medicine? Do not take this medicine with any of the following medications:  MAOIs like Carbex, Eldepryl, Marplan, Nardil, and Parnate This medicine may also interact with the following medications:  alcohol  barbiturates like phenobarbital  medicines for bladder spasm like oxybutynin, tolterodine  medicines for blood pressure  medicines for depression, anxiety, or psychotic disturbances  medicines for movement abnormalities or Parkinson's disease  medicines for sleep  other medicines for cold, cough, or allergy  some medicines for the stomach like chlordiazepoxide, dicyclomine This list may not describe all possible interactions. Give your health care provider a list of all the medicines, herbs, non-prescription drugs, or dietary supplements you use. Also tell them if you smoke, drink alcohol, or use illegal drugs. Some items may interact with your medicine. What should I watch for while using this medicine? Visit your doctor or health care professional for regular check ups. Tell your doctor or health care professional if your symptoms do not start to get better or if they get worse. Your mouth may get dry. Chewing sugarless gum or sucking hard candy, and drinking plenty of water may help. Contact your doctor  if the problem does not go away or is severe. This medicine may cause dry eyes and blurred vision. If you wear contact lenses you may feel some discomfort. Lubricating drops may help. See your eye doctor if the problem does not go away or is severe. You may get drowsy or dizzy. Do not drive, use machinery, or do anything  that needs mental alertness until you know how this medicine affects you. Do not stand or sit up quickly, especially if you are an older patient. This reduces the risk of dizzy or fainting spells. Alcohol may interfere with the effect of this medicine. Avoid alcoholic drinks. What side effects may I notice from receiving this medicine? Side effects that you should report to your doctor or health care professional as soon as possible:  allergic reactions like skin rash, itching or hives, swelling of the face, lips, or tongue  changes in vision  confused, agitated, or nervous  fast, irregular heartbeat  tremor  trouble passing urine or change in the amount of urine  unusual bleeding or bruising  unusually weak or tired Side effects that usually do not require medical attention (report to your doctor or health care professional if they continue or are bothersome):  constipation, diarrhea  drowsy  headache  loss of appetite  stomach upset, vomiting  thick mucus This list may not describe all possible side effects. Call your doctor for medical advice about side effects. You may report side effects to FDA at 1-800-FDA-1088. Where should I keep my medicine? Keep out of the reach of children. Store at room temperature between 20 and 25 degrees C (68 and 77 degrees F). Keep container closed tightly. Throw away any unused medicine after the expiration date. NOTE: This sheet is a summary. It may not cover all possible information. If you have questions about this medicine, talk to your doctor, pharmacist, or health care provider.  2020 Elsevier/Gold Standard (2007-08-13 16:14:00)

## 2019-03-16 NOTE — Assessment & Plan Note (Signed)
GI to addres , specify if related to her GI tract, this is chronic, if not GI will have hematology eval

## 2019-03-16 NOTE — Assessment & Plan Note (Signed)
Controlled, no change in medication DASH diet and commitment to daily physical activity for a minimum of 30 minutes discussed and encouraged, as a part of hypertension management. The importance of attaining a healthy weight is also discussed.  BP/Weight 03/15/2019 02/25/2019 09/29/2018 08/17/2018 03/11/2018 03/08/2018 123XX123  Systolic BP 0000000 [id number] 0000000 0000000 [id number]  Diastolic BP 82 80 80 80 80 86 91  Wt. (Lbs) 163 163 168 165 163.9 166 165  BMI 27.12 27.12 29.76 29.23 27.27 27.62 27.46

## 2019-03-16 NOTE — Assessment & Plan Note (Signed)
Persistent, uncontrolled. Will try bedtime benadryl Needs to re establish with therapist

## 2019-03-16 NOTE — Assessment & Plan Note (Signed)
Denies depression, however remains anxious with insomnia, she is to reach out to her theraopist

## 2019-03-16 NOTE — Assessment & Plan Note (Signed)
Hyperlipidemia:Low fat diet discussed and encouraged.   Lipid Panel  Lab Results  Component Value Date   CHOL 197 09/24/2018   HDL 57 09/24/2018   LDLCALC 124 (H) 09/24/2018   TRIG 72 09/24/2018   CHOLHDL 3.5 09/24/2018  needs to reduce fried and fatty foods

## 2019-03-16 NOTE — Assessment & Plan Note (Signed)
Needs GI follow up, has not been in 1 year. Questions re anemia and also re " safe supplements" will refer

## 2019-05-10 ENCOUNTER — Encounter (INDEPENDENT_AMBULATORY_CARE_PROVIDER_SITE_OTHER): Payer: Self-pay

## 2019-05-16 NOTE — Progress Notes (Signed)
Patient profile: Yolanda Foley is a 79 y.o. female  last seen in clinic in 2019 for [place].  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  17 minutes phone time  History of Present Illness: Yolanda Foley is referred today for anemia. She reports overall feeling run down and tired-for "good while", thinks it was most of 2020 feeling weak and run down. No nausea/vomiting, GERD, or epigastric pain. Occasional gas if eats late a night, especially if eats sweet or greasy foods, she does have a known history of gallstones but has deferred surgery in the past given minimal symptoms.  We discussed today avoiding trigger foods and to notify me if symptoms are frequent.  Has a BM daily, feels urso helps keep stools regular, if forgets urso may have mild constipation, resolves when she restarts Urso. She denies any rectal bleeding or melena. No lower abd pain.   Takes probiotic at night.   She reports she is currently taking urso 1x/day, she has been on BID in past but this made her dizzy.   Taking iron supplement three times a week on days she feels the worst. Has been on B12 supplement in past and now just taking "sometimes"  Reports weight today is #164.  Wt Readings from Last 3 Encounters:  03/15/19 163 lb (73.9 kg)  02/25/19 163 lb (73.9 kg)  09/29/18 168 lb (76.2 kg)     Last Colonoscopy: Colonoscopy 2011-internal and external hemorrhoids, otherwise normal Last Endoscopy: None prior    Past Medical History:  Past Medical History:  Diagnosis Date  . Cholelithiasis   . Chronic pain   . Depression   . Elevated liver enzymes   . GERD (gastroesophageal reflux disease)   . Hyperlipidemia   . Hypertension   . Nonspecific elevation of levels of transaminase or lactic acid dehydrogenase (LDH)   . Palpitation   . Primary biliary cirrhosis ([place])     Problem List: Patient Active Problem List   Diagnosis Date Noted  . Light headedness 10/14/2018  . Stress and  adjustment reaction 08/22/2018  . Overweight (BMI 25.0-29.9) 03/12/2018  . Primary biliary cirrhosis ([place]) 08/08/2013  . Onychomycosis 07/17/2013  . Osteoporosis 03/29/2013  . Depression 03/30/2012  . Hearing loss 03/30/2012  . Prediabetes 10/09/2011  . Transaminasemia 07/08/2011  . Insomnia 02/09/2011  . Cholelithiasis 08/05/2010  . Anemia 06/22/2009  . FATIGUE 06/22/2009  . ALLERGIC RHINITIS, SEASONAL 03/16/2008  . Essential hypertension 07/19/2007  . GERD 07/19/2007  . Hyperlipemia 04/23/2007    Past Surgical History: Past Surgical History:  Procedure Laterality Date  . correction of nasal surgery  35 years ago   . Left ovarian cyst removal  1974  . ROTATOR CUFF REPAIR Left 08/2016  . TONSILLECTOMY  1967     Allergies: Allergies  Allergen Reactions  . [place]   . Fluoxetine Other (See Comments)    Dull headaache  . Oxycodone Nausea And Vomiting  . Sulfonamide Derivatives       Home Medications:  Current Outpatient Medications:  .  aspirin (ASPIRIN LOW DOSE) 81 MG EC tablet, Take 81 mg by mouth as needed. Take one tablet by mouth once a day, Disp: , Rfl:  .  BLACK CURRANT SEED OIL PO, Take 2 capsules by mouth daily., Disp: , Rfl:  .  Chlorophyll 50 MG CAPS, Take 3 capsules by mouth daily., Disp: , Rfl:  .  Coenzyme Q10-Fish Oil-Vit E (CO-Q 10  OMEGA-3 FISH OIL) CAPS, Take 2 capsules by mouth daily., Disp: , Rfl:  .  Digestive Enzymes (PAPAYA ENZYME) CHEW, Chew by mouth as needed., Disp: , Rfl:  .  Fish Oil-Krill Oil (KRILL OIL PLUS) CAPS, Take 1 capsule by mouth daily., Disp: , Rfl:  .  Green Tea, Camillia sinensis, (GREEN TEA PO), Take by mouth. Patient states that this is a decaf tea and she drinks it 1-2 times daily, Disp: , Rfl:  .  MACA ROOT PO, Take 523 mg by mouth 2 (two) times daily. , Disp: , Rfl:  .  Methylsulfonylmethane (MSM) 500 MG CAPS, Take 1 capsule by mouth daily. , Disp: , Rfl:  .  Milk Thistle 200 MG CAPS, Take 1 capsule by mouth 2  (two) times daily., Disp: , Rfl:  .  Multiple Vitamin (MULTIVITAMIN) tablet, Take 1 tablet by mouth daily. With iron, Disp: , Rfl:  .  olmesartan (BENICAR) 40 MG tablet, Take 1 tablet (40 mg total) by mouth daily., Disp: 90 tablet, Rfl: 0 .  Turmeric (CURCUMIN 95 PO), Take by mouth 2 (two) times daily. Patient states that this is CURCUMIN only., Disp: , Rfl:  .  ursodiol (ACTIGALL) 300 MG capsule, TAKE 1 CAPSULE BY MOUTH THREE TIMES DAILY AFTER A MEAL, Disp: 90 capsule, Rfl: 0 .  vitamin B-12 (CYANOCOBALAMIN) 1000 MCG tablet, Take 1,000 mcg by mouth daily., Disp: , Rfl:  .  vitamin C (ASCORBIC ACID) 500 MG tablet, Take 500 mg by mouth daily. , Disp: , Rfl:  .  amLODipine (NORVASC) 2.5 MG tablet, Take 1 tablet (2.5 mg total) by mouth daily. (Patient not taking: Reported on 03/15/2019), Disp: 30 tablet, Rfl: 5 .  IRON CR PO, Take 80 mg by mouth daily., Disp: , Rfl:    Family History: family history includes Cancer in her maternal aunt; Cancer (age of onset: 33) in her brother; Dementia in her maternal aunt and mother; Heart failure in her father; Hypertension in her father.    Social History:   reports that she has never smoked. She has never used smokeless tobacco. She reports that she does not drink alcohol or use drugs.   Review of Systems: Constitutional: Denies weight loss/weight gain  Eyes: No changes in vision. ENT: No oral lesions, sore throat.  GI: see HPI.  Heme/Lymph: No easy bruising.  CV: No chest pain.  GU: No hematuria.  Integumentary: No rashes.  Neuro: No headaches.  Psych: No depression/anxiety.  Endocrine: No heat/cold intolerance.  Allergic/Immunologic: No urticaria.  Resp: No cough, SOB.  Musculoskeletal: No joint swelling.    Physical Examination: There were no vitals taken for this visit. Telephone only visit - NAD   Data Reviewed:  Last labs available in epic-May 2020-CBC with hemoglobin 10.7, MCV 88, platelets 133, CMP with alk phos 157, otherwise normal.   TSH normal   November 2019 CMP with alk phos 191.  Transaminases normal.  02/2018-Abd US - IMPRESSION: 1. Gallstones, the largest of 2.1 cm in diameter. No present evidence of acute cholecystitis by ultrasound. 2. Changes of cirrhosis. 3. 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 4. No hydronephrosis.  Assessment/Plan: Ms. Godsil is a 79 y.o. female   1.  Elevated alk phos/history of PBC with cirrhosis noted on last ultrasound 2019.  She needs repeat labs today. Currently taking urso 336m daily. She also needs an AFP given the changes of cirrhosis on last [place]  We will plan to do AFP and ultrasound every 6 months instead  of yearly in future. No recent basic hepatic labs, ordered as below for monitoring.   I do not see in chart where she has had viral serologies-will check today, if not immune to Hep A & B will need vaccination.    2.  Anemia unspecified-last hemoglobin 10.7. Normocytic.  We will check iron studies, ferritin, b12, folate for evaluation, she feels fatigue is significant. Reports being on b12 supplement in past but no longer taking. Also no longer on iron-check lab  *likely will need EGD/colonoscopy for evaluation, if she is not anemic she prefers to do Cologuard instead of colonoscopy.  Reviewed we would call her after we get her labs back. Would also consider EGD for varice screening (none prior)  3.  Gallstones-briefly discussed alarm symptoms to contact us with, I agree holding off on surgery given she is asymptomatic   [place] was seen today for follow-up.  Diagnoses and all orders for this visit:  Primary biliary cirrhosis (HCC) -     COMPLETE METABOLIC PANEL WITH GFR -     AFP tumor marker -     CBC with Differential/Platelet -     Protime-INR -     Ammonia -     US Abdomen Limited RUQ; Future -     Fe+TIBC+Fer  Anemia, unspecified type -     COMPLETE METABOLIC PANEL WITH GFR -     AFP tumor marker -     CBC with Differential/Platelet -      Protime-INR -     Ammonia -     US Abdomen Limited RUQ; Future -     Fe+TIBC+Fer -     B12 and Folate Panel  History of non anemic vitamin B12 deficiency -     COMPLETE METABOLIC PANEL WITH GFR -     AFP tumor marker -     CBC with Differential/Platelet -     Protime-INR -     Ammonia -     US Abdomen Limited RUQ; Future -     Fe+TIBC+Fer -     B12 and Folate Panel  Biliary cirrhosis, unspecified (HCC) -     AFP tumor marker  Other cirrhosis of liver (HCC)  -     AFP tumor marker        I personally performed the service, non-incident to. (WP)  Laurine Blazer, [facility]

## 2019-05-18 ENCOUNTER — Ambulatory Visit (INDEPENDENT_AMBULATORY_CARE_PROVIDER_SITE_OTHER): Payer: Medicare Other | Admitting: Gastroenterology

## 2019-05-18 ENCOUNTER — Encounter (INDEPENDENT_AMBULATORY_CARE_PROVIDER_SITE_OTHER): Payer: Self-pay | Admitting: Gastroenterology

## 2019-05-18 ENCOUNTER — Other Ambulatory Visit: Payer: Self-pay

## 2019-05-18 DIAGNOSIS — Z8639 Personal history of other endocrine, nutritional and metabolic disease: Secondary | ICD-10-CM

## 2019-05-18 DIAGNOSIS — K7469 Other cirrhosis of liver: Secondary | ICD-10-CM

## 2019-05-18 DIAGNOSIS — D649 Anemia, unspecified: Secondary | ICD-10-CM | POA: Diagnosis not present

## 2019-05-18 DIAGNOSIS — K745 Biliary cirrhosis, unspecified: Secondary | ICD-10-CM | POA: Diagnosis not present

## 2019-05-18 DIAGNOSIS — K743 Primary biliary cirrhosis: Secondary | ICD-10-CM

## 2019-05-24 ENCOUNTER — Ambulatory Visit (HOSPITAL_COMMUNITY): Payer: Medicare Other

## 2019-06-13 ENCOUNTER — Telehealth (INDEPENDENT_AMBULATORY_CARE_PROVIDER_SITE_OTHER): Payer: Self-pay | Admitting: Gastroenterology

## 2019-06-13 NOTE — Telephone Encounter (Signed)
Yolanda Foley-this patient did not go for labs after her last televisit, please remind her to get these done at her convenience.  She also has not had the ultrasound scheduled that we ordered.  Thanks

## 2019-06-14 ENCOUNTER — Encounter: Payer: Self-pay | Admitting: Family Medicine

## 2019-06-14 ENCOUNTER — Encounter (INDEPENDENT_AMBULATORY_CARE_PROVIDER_SITE_OTHER): Payer: Self-pay

## 2019-06-14 NOTE — Telephone Encounter (Signed)
Per Reba - the patient fell and she has been nursing her self back to a normal state.  Actually she fell after trying to help her husband up who had fallen. Patient is aware that she needs lab and a [place]. She will let us know when she is ready.

## 2019-07-07 DIAGNOSIS — Z23 Encounter for immunization: Secondary | ICD-10-CM | POA: Diagnosis not present

## 2019-07-25 ENCOUNTER — Ambulatory Visit: Payer: Medicare Other | Admitting: Family Medicine

## 2019-08-03 DIAGNOSIS — Z23 Encounter for immunization: Secondary | ICD-10-CM | POA: Diagnosis not present

## 2019-08-31 ENCOUNTER — Other Ambulatory Visit: Payer: Self-pay | Admitting: Family Medicine

## 2019-10-03 ENCOUNTER — Encounter: Payer: Self-pay | Admitting: Family Medicine

## 2019-10-03 DIAGNOSIS — Z1231 Encounter for screening mammogram for malignant neoplasm of breast: Secondary | ICD-10-CM | POA: Diagnosis not present

## 2019-11-16 ENCOUNTER — Ambulatory Visit: Payer: Medicare Other | Admitting: Family Medicine

## 2019-11-17 DIAGNOSIS — D649 Anemia, unspecified: Secondary | ICD-10-CM | POA: Diagnosis not present

## 2019-11-17 DIAGNOSIS — D539 Nutritional anemia, unspecified: Secondary | ICD-10-CM | POA: Diagnosis not present

## 2019-11-17 DIAGNOSIS — I10 Essential (primary) hypertension: Secondary | ICD-10-CM | POA: Diagnosis not present

## 2019-11-18 ENCOUNTER — Encounter: Payer: Self-pay | Admitting: Family Medicine

## 2019-11-18 LAB — COMPLETE METABOLIC PANEL WITH GFR
AG Ratio: 1 (calc) (ref 1.0–2.5)
ALT: 24 U/L (ref 6–29)
AST: 35 U/L (ref 10–35)
Albumin: 4 g/dL (ref 3.6–5.1)
Alkaline phosphatase (APISO): 192 U/L — ABNORMAL HIGH (ref 37–153)
BUN: 10 mg/dL (ref 7–25)
CO2: 29 mmol/L (ref 20–32)
Calcium: 9.3 mg/dL (ref 8.6–10.4)
Chloride: 105 mmol/L (ref 98–110)
Creat: 0.72 mg/dL (ref 0.60–0.88)
GFR, Est African American: 91 mL/min/{1.73_m2} (ref >=60)
GFR, Est Non African American: 79 mL/min/{1.73_m2} (ref >=60)
Globulin: 4.2 g/dL (calc) — ABNORMAL HIGH (ref 1.9–3.7)
Glucose, Bld: 85 mg/dL (ref 65–99)
Potassium: 4.2 mmol/L (ref 3.5–5.3)
Sodium: 139 mmol/L (ref 135–146)
Total Bilirubin: 0.4 mg/dL (ref 0.2–1.2)
Total Protein: 8.2 g/dL — ABNORMAL HIGH (ref 6.1–8.1)

## 2019-11-18 LAB — CBC
HCT: 34 % — ABNORMAL LOW (ref 35.0–45.0)
Hemoglobin: 10.6 g/dL — ABNORMAL LOW (ref 11.7–15.5)
MCH: 28.3 pg (ref 27.0–33.0)
MCHC: 31.2 g/dL — ABNORMAL LOW (ref 32.0–36.0)
MCV: 90.9 fL (ref 80.0–100.0)
MPV: 14.1 fL — ABNORMAL HIGH (ref 7.5–12.5)
Platelets: 110 10*3/uL — ABNORMAL LOW (ref 140–400)
RBC: 3.74 10*6/uL — ABNORMAL LOW (ref 3.80–5.10)
RDW: 12.9 % (ref 11.0–15.0)
WBC: 4 10*3/uL (ref 3.8–10.8)

## 2019-11-18 LAB — B12 AND FOLATE PANEL
Folate: 13.5 ng/mL
Vitamin B-12: 704 pg/mL (ref 200–1100)

## 2019-11-18 LAB — IRON: Iron: 93 ug/dL (ref 45–160)

## 2019-11-18 LAB — FERRITIN: Ferritin: 642 ng/mL — ABNORMAL HIGH (ref 16–288)

## 2019-11-24 ENCOUNTER — Encounter: Payer: Self-pay | Admitting: Family Medicine

## 2019-11-24 ENCOUNTER — Ambulatory Visit (INDEPENDENT_AMBULATORY_CARE_PROVIDER_SITE_OTHER): Payer: Medicare Other | Admitting: Family Medicine

## 2019-11-24 ENCOUNTER — Other Ambulatory Visit: Payer: Self-pay

## 2019-11-24 VITALS — BP 155/74 | HR 86 | Resp 15 | Ht 65.0 in | Wt 167.0 lb

## 2019-11-24 DIAGNOSIS — D649 Anemia, unspecified: Secondary | ICD-10-CM

## 2019-11-24 DIAGNOSIS — E663 Overweight: Secondary | ICD-10-CM | POA: Diagnosis not present

## 2019-11-24 DIAGNOSIS — Z780 Asymptomatic menopausal state: Secondary | ICD-10-CM

## 2019-11-24 DIAGNOSIS — R7989 Other specified abnormal findings of blood chemistry: Secondary | ICD-10-CM

## 2019-11-24 DIAGNOSIS — K743 Primary biliary cirrhosis: Secondary | ICD-10-CM

## 2019-11-24 DIAGNOSIS — I10 Essential (primary) hypertension: Secondary | ICD-10-CM

## 2019-11-24 DIAGNOSIS — K8070 Calculus of gallbladder and bile duct without cholecystitis without obstruction: Secondary | ICD-10-CM

## 2019-11-24 DIAGNOSIS — D696 Thrombocytopenia, unspecified: Secondary | ICD-10-CM | POA: Insufficient documentation

## 2019-11-24 DIAGNOSIS — E7849 Other hyperlipidemia: Secondary | ICD-10-CM | POA: Diagnosis not present

## 2019-11-24 MED ORDER — AMLODIPINE BESYLATE 2.5 MG PO TABS
2.5000 mg | ORAL_TABLET | Freq: Every day | ORAL | 5 refills | Status: DC
Start: 2019-11-24 — End: 2020-02-09

## 2019-11-24 MED ORDER — OLMESARTAN MEDOXOMIL 40 MG PO TABS: 40.0000 mg | ORAL_TABLET | Freq: Every day | ORAL | 5 refills | Status: DC

## 2019-11-24 MED ORDER — CALCIUM CARBONATE-VITAMIN D 500-400 MG-UNIT PO TABS: 1.0000 | ORAL_TABLET | Freq: Two times a day (BID) | ORAL | 5 refills | Status: DC

## 2019-11-24 NOTE — Patient Instructions (Addendum)
F/u in office re eval blood pressure in 8 to 10 weeks, call if you need me before  Please schedule dexa prior to discharge   Please call and reschedule study that GI had ordered in the past  Thanks for choosing [facility], we consider it a privelige to serve you.  It is important that you exercise regularly at least 30 minutes 5 times a week. If you develop chest pain, have severe difficulty breathing, or feel very tired, stop exercising immediately and seek medical attention   Think about what you will eat, plan ahead. Choose " clean, green, fresh or frozen" over canned, processed or packaged foods which are more sugary, salty and fatty. 70 to 75% of food eaten should be vegetables and fruit. Three meals at set times with snacks allowed between meals, but they must be fruit or vegetables. Aim to eat over a 12 hour period , example 7 am to 7 pm, and STOP after  your last meal of the day. Drink water,generally about 64 ounces per day, no other drink is as healthy. Fruit juice is best enjoyed in a healthy way, by EATING the fruit.

## 2019-11-26 ENCOUNTER — Encounter: Payer: Self-pay | Admitting: Family Medicine

## 2019-11-26 NOTE — Assessment & Plan Note (Signed)
High serum ferritin, and low platelets,refer hematology

## 2019-11-26 NOTE — Progress Notes (Signed)
Yolanda Foley     MRN: [id number]      DOB: [birth year]   HPI [name] is here for follow up and re-evaluation of chronic medical conditions, medication management and review of any available recent lab and radiology data.  Preventive health is updated, specifically  Cancer screening and Immunization.   Questions or concerns regarding consultations or procedures which the PT has had in the interim are  addressed. The PT denies any adverse reactions to current medications since the last visit.  C/o increased anxiety and nervousness not currently seeing mental health  ROS Denies recent fever or chills. Denies sinus pressure, nasal congestion, ear pain or sore throat. Denies chest congestion, productive cough or wheezing. Denies chest pains, palpitations and leg swelling Denies abdominal pain, nausea, vomiting,diarrhea or constipation.   Denies dysuria, frequency, hesitancy or incontinence. Denies joint pain, swelling and limitation in mobility. Denies headaches, seizures, numbness, or tingling.  Denies skin break down or rash.   PE  BP (!) 155/74   Pulse 86   Resp 15   Ht 5\' 5"  (1.651 m)   Wt 167 lb (75.8 kg)   SpO2 95%   BMI 27.79 kg/m   Patient alert and oriented and in no cardiopulmonary distress.  HEENT: No facial asymmetry, EOMI,     Neck supple .  Chest: Clear to auscultation bilaterally.  CVS: S1, S2 no murmurs, no S3.Regular rate.  ABD: Soft non tender.   Ext: No edema  MS: Adequate ROM spine, shoulders, hips and knees.  Skin: Intact, no ulcerations or rash noted.  Psych: Good eye contact, normal affect. Memory intact not anxious or depressed appearing.  CNS: CN 2-12 intact, power,  normal throughout.no focal deficits noted.   Assessment & Plan  Essential hypertension Not at goal, uncontrolled , ahs been off amlodipine, will resume DASH diet and commitment to daily physical activity for a minimum of 30 minutes discussed and encouraged, as a part of  hypertension management. The importance of attaining a healthy weight is also discussed.  BP/Weight 11/24/2019 03/15/2019 02/25/2019 09/29/2018 08/17/2018 03/11/2018 78/29/5621  Systolic BP 308 657 846 962 952 841 324  Diastolic BP 74 82 80 80 80 80 86  Wt. (Lbs) 167 163 163 168 165 163.9 166  BMI 27.79 27.12 27.12 29.76 29.23 27.27 27.62       High serum ferritin High serum ferritin, and low platelets,refer hematology  Hyperlipemia Hyperlipidemia:Low fat diet discussed and encouraged.   Lipid Panel  Lab Results  Component Value Date   CHOL 197 09/24/2018   HDL 57 09/24/2018   LDLCALC 124 (H) 09/24/2018   TRIG 72 09/24/2018   CHOLHDL 3.5 09/24/2018   Updated lab needed at/ before next visit.  needs to lower fat intake   Primary biliary cirrhosis ([place]) Followed by gI and on ursodiol  Cholelithiasis asymptomatic  Overweight (BMI 25.0-29.9)  Patient re-educated about  the importance of commitment to a  minimum of 150 minutes of exercise per week as able.  The importance of healthy food choices with portion control discussed, as well as eating regularly and within a 12 hour window most days. The need to choose "clean , green" food 50 to 75% of the time is discussed, as well as to make water the primary drink and set a goal of 64 ounces water daily.    Weight /BMI 11/24/2019 03/15/2019 02/25/2019  WEIGHT 167 lb 163 lb 163 lb  HEIGHT 5\' 5"  5\' 5"  5\' 5"   BMI 27.79  kg/m2 27.12 kg/m2 27.12 kg/m2

## 2019-11-26 NOTE — Assessment & Plan Note (Signed)
Hyperlipidemia:Low fat diet discussed and encouraged.   Lipid Panel  Lab Results  Component Value Date   CHOL 197 09/24/2018   HDL 57 09/24/2018   LDLCALC 124 (H) 09/24/2018   TRIG 72 09/24/2018   CHOLHDL 3.5 09/24/2018   Updated lab needed at/ before next visit.  needs to lower fat intake

## 2019-11-26 NOTE — Assessment & Plan Note (Signed)
Not at goal, uncontrolled , ahs been off amlodipine, will resume DASH diet and commitment to daily physical activity for a minimum of 30 minutes discussed and encouraged, as a part of hypertension management. The importance of attaining a healthy weight is also discussed.  BP/Weight 11/24/2019 03/15/2019 02/25/2019 09/29/2018 08/17/2018 03/11/2018 79/43/2761  Systolic BP 470 929 574 734 037 096 438  Diastolic BP 74 82 80 80 80 80 86  Wt. (Lbs) 167 163 163 168 165 163.9 166  BMI 27.79 27.12 27.12 29.76 29.23 27.27 27.62

## 2019-11-26 NOTE — Assessment & Plan Note (Signed)
  Patient re-educated about  the importance of commitment to a  minimum of 150 minutes of exercise per week as able.  The importance of healthy food choices with portion control discussed, as well as eating regularly and within a 12 hour window most days. The need to choose "clean , green" food 50 to 75% of the time is discussed, as well as to make water the primary drink and set a goal of 64 ounces water daily.    Weight /BMI 11/24/2019 03/15/2019 02/25/2019  WEIGHT 167 lb 163 lb 163 lb  HEIGHT 5\' 5"  5\' 5"  5\' 5"   BMI 27.79 kg/m2 27.12 kg/m2 27.12 kg/m2

## 2019-11-26 NOTE — Assessment & Plan Note (Signed)
Followed by gI and on ursodiol

## 2019-11-26 NOTE — Assessment & Plan Note (Signed)
asymptomatic

## 2019-12-05 ENCOUNTER — Other Ambulatory Visit (HOSPITAL_COMMUNITY): Payer: Medicare Other

## 2019-12-23 ENCOUNTER — Ambulatory Visit (HOSPITAL_COMMUNITY)
Admission: RE | Admit: 2019-12-23 | Discharge: 2019-12-23 | Disposition: A | Discharge status: Home / Self Care | Payer: Medicare Other | Source: Ambulatory Visit | Attending: Family Medicine | Admitting: Family Medicine

## 2019-12-23 ENCOUNTER — Other Ambulatory Visit: Payer: Self-pay

## 2019-12-23 DIAGNOSIS — Z780 Asymptomatic menopausal state: Secondary | ICD-10-CM | POA: Diagnosis not present

## 2019-12-23 DIAGNOSIS — R29890 Loss of height: Secondary | ICD-10-CM | POA: Diagnosis not present

## 2019-12-24 ENCOUNTER — Encounter: Payer: Self-pay | Admitting: Family Medicine

## 2019-12-27 ENCOUNTER — Other Ambulatory Visit: Payer: Self-pay | Admitting: Family Medicine

## 2019-12-27 MED ORDER — ALENDRONATE SODIUM 70 MG PO TABS: 70.0000 mg | ORAL_TABLET | ORAL | 11 refills | Status: DC

## 2019-12-28 ENCOUNTER — Ambulatory Visit: Payer: Medicare Other | Admitting: Family Medicine

## 2020-01-26 ENCOUNTER — Ambulatory Visit: Payer: Medicare Other | Admitting: Family Medicine

## 2020-02-09 ENCOUNTER — Other Ambulatory Visit: Payer: Self-pay

## 2020-02-09 ENCOUNTER — Ambulatory Visit (INDEPENDENT_AMBULATORY_CARE_PROVIDER_SITE_OTHER): Payer: Medicare Other | Admitting: Family Medicine

## 2020-02-09 ENCOUNTER — Encounter: Payer: Self-pay | Admitting: Family Medicine

## 2020-02-09 VITALS — BP 150/78 | HR 74 | Temp 97.8°F | Resp 20 | Ht 65.3 in | Wt 157.0 lb

## 2020-02-09 DIAGNOSIS — E663 Overweight: Secondary | ICD-10-CM | POA: Diagnosis not present

## 2020-02-09 DIAGNOSIS — K219 Gastro-esophageal reflux disease without esophagitis: Secondary | ICD-10-CM | POA: Diagnosis not present

## 2020-02-09 DIAGNOSIS — R7303 Prediabetes: Secondary | ICD-10-CM | POA: Diagnosis not present

## 2020-02-09 DIAGNOSIS — E7849 Other hyperlipidemia: Secondary | ICD-10-CM

## 2020-02-09 DIAGNOSIS — G4700 Insomnia, unspecified: Secondary | ICD-10-CM | POA: Diagnosis not present

## 2020-02-09 DIAGNOSIS — Z23 Encounter for immunization: Secondary | ICD-10-CM | POA: Diagnosis not present

## 2020-02-09 DIAGNOSIS — I10 Essential (primary) hypertension: Secondary | ICD-10-CM

## 2020-02-09 DIAGNOSIS — R7989 Other specified abnormal findings of blood chemistry: Secondary | ICD-10-CM

## 2020-02-09 MED ORDER — AMLODIPINE BESYLATE 5 MG PO TABS: 5.0000 mg | ORAL_TABLET | Freq: Every day | ORAL | 5 refills | Status: DC

## 2020-02-09 NOTE — Patient Instructions (Addendum)
F/U in office with MD, re evaluate blood pressure mid January, call if you need me before  Flu vaccine today in the office  New higher dose of amlodipine is 5 mg one daily  I will be in touch next week,  re injectable medication for bone building, since you do have osteoporosis and need treatment  Please get fasting lipid, TSH and iron and ferritin level and CBC, as soon as possible.  Please reschedule your appointment with Dr Laural Golden, you need to continue to follow up regularly with the GI Doctor

## 2020-02-09 NOTE — Progress Notes (Signed)
   Yolanda Foley     MRN: [id number]      DOB: Dec 04, 1940   HPI Yolanda Foley is here for follow up and re-evaluation of chronic medical conditions, medication management and review of any available recent lab and radiology data.  Preventive health is updated, specifically  Cancer screening and Immunization.   Needs to make and keep f/u with GI The PT denies any adverse reactions to current medications since the last visit.    ROS Denies recent fever or chills. Denies sinus pressure, nasal congestion, ear pain or sore throat. Denies chest congestion, productive cough or wheezing. Denies chest pains, palpitations and leg swelling Denies abdominal pain, nausea, vomiting,diarrhea or constipation.   Denies dysuria, frequency, hesitancy or incontinence. Denies joint pain, swelling and limitation in mobility. Denies headaches, seizures, numbness, or tingling. Denies depression,uncontrolled nxiety or insomnia. Denies skin break down or rash.   PE  BP (!) 150/78   Pulse 74   Temp 97.8 F (36.6 C)   Resp 20   Ht 5' 5.3" (1.659 m)   Wt 157 lb (71.2 kg)   SpO2 99%   BMI 25.89 kg/m   Patient alert and oriented and in no cardiopulmonary distress.  HEENT: No facial asymmetry, EOMI,     Neck supple .  Chest: Clear to auscultation bilaterally.  CVS: S1, S2 no murmurs, no S3.Regular rate.  ABD: Soft non tender.   Ext: No edema  MS: Adequate though reduced  ROM spine, shoulders, hips and knees.  Skin: Intact, no ulcerations or rash noted.  Psych: Good eye contact, normal affect. Memory intact not anxious or depressed appearing.  CNS: CN 2-12 intact, power,  normal throughout.no focal deficits noted.   Assessment & Plan  Osteoporosis Change to injectable medication for treatment due to GERD,  Essential hypertension Uncontroled, increase amlodipine to 5 mg daily DASH diet and commitment to daily physical activity for a minimum of 30 minutes discussed and encouraged, as a part of  hypertension management. The importance of attaining a healthy weight is also discussed.  BP/Weight 02/09/2020 11/24/2019 03/15/2019 02/25/2019 09/29/2018 08/17/2018 01/77/9390  Systolic BP 300 923 300 762 263 335 456  Diastolic BP 78 74 82 80 80 80 80  Wt. (Lbs) 157 167 163 163 168 165 163.9  BMI 25.89 27.79 27.12 27.12 29.76 29.23 27.27       Overweight (BMI 25.0-29.9)  Patient re-educated about  the importance of commitment to a  minimum of 150 minutes of exercise per week as able.  The importance of healthy food choices with portion control discussed, as well as eating regularly and within a 12 hour window most days. The need to choose "clean , green" food 50 to 75% of the time is discussed, as well as to make water the primary drink and set a goal of 64 ounces water daily.    Weight /BMI 02/09/2020 11/24/2019 03/15/2019  WEIGHT 157 lb 167 lb 163 lb  HEIGHT 5' 5.3" 5\' 5"  5\' 5"   BMI 25.89 kg/m2 27.79 kg/m2 27.12 kg/m2      Hyperlipemia Hyperlipidemia:Low fat diet discussed and encouraged.   Lipid Panel  Lab Results  Component Value Date   CHOL 197 09/24/2018   HDL 57 09/24/2018   LDLCALC 124 (H) 09/24/2018   TRIG 72 09/24/2018   CHOLHDL 3.5 09/24/2018  Updated lab needed at/ before next visit. Needs to reduce fried and fatty foods

## 2020-02-10 ENCOUNTER — Encounter: Payer: Self-pay | Admitting: Family Medicine

## 2020-02-10 ENCOUNTER — Telehealth: Payer: Self-pay | Admitting: Family Medicine

## 2020-02-10 MED ORDER — DENOSUMAB 60 MG/ML ~~LOC~~ SOSY: 60.0000 mg | PREFILLED_SYRINGE | SUBCUTANEOUS | 1 refills | Status: DC

## 2020-02-10 NOTE — Assessment & Plan Note (Signed)
Change to injectable medication for treatment due to GERD,

## 2020-02-10 NOTE — Telephone Encounter (Signed)
Please call pt let her know ne w medication for osteoporosis is prescribed, she needs to collect it and you arrange to administer in the office

## 2020-02-10 NOTE — Assessment & Plan Note (Signed)
  Patient re-educated about  the importance of commitment to a  minimum of 150 minutes of exercise per week as able.  The importance of healthy food choices with portion control discussed, as well as eating regularly and within a 12 hour window most days. The need to choose "clean , green" food 50 to 75% of the time is discussed, as well as to make water the primary drink and set a goal of 64 ounces water daily.    Weight /BMI 02/09/2020 11/24/2019 03/15/2019  WEIGHT 157 lb 167 lb 163 lb  HEIGHT 5' 5.3" 5\' 5"  5\' 5"   BMI 25.89 kg/m2 27.79 kg/m2 27.12 kg/m2

## 2020-02-10 NOTE — Assessment & Plan Note (Signed)
Hyperlipidemia:Low fat diet discussed and encouraged.   Lipid Panel  Lab Results  Component Value Date   CHOL 197 09/24/2018   HDL 57 09/24/2018   LDLCALC 124 (H) 09/24/2018   TRIG 72 09/24/2018   CHOLHDL 3.5 09/24/2018  Updated lab needed at/ before next visit. Needs to reduce fried and fatty foods

## 2020-02-10 NOTE — Assessment & Plan Note (Signed)
Uncontroled, increase amlodipine to 5 mg daily DASH diet and commitment to daily physical activity for a minimum of 30 minutes discussed and encouraged, as a part of hypertension management. The importance of attaining a healthy weight is also discussed.  BP/Weight 02/09/2020 11/24/2019 03/15/2019 02/25/2019 09/29/2018 08/17/2018 87/86/7672  Systolic BP 094 709 628 366 294 765 465  Diastolic BP 78 74 82 80 80 80 80  Wt. (Lbs) 157 167 163 163 168 165 163.9  BMI 25.89 27.79 27.12 27.12 29.76 29.23 27.27

## 2020-02-13 NOTE — Telephone Encounter (Signed)
Needs to collect med at the pharmacy and please schedule nurse visit to come in to have it administered

## 2020-02-15 NOTE — Telephone Encounter (Signed)
Pt is aware.  

## 2020-02-24 ENCOUNTER — Other Ambulatory Visit: Payer: Self-pay | Admitting: Family Medicine

## 2020-02-24 DIAGNOSIS — E663 Overweight: Secondary | ICD-10-CM | POA: Diagnosis not present

## 2020-02-24 DIAGNOSIS — G4700 Insomnia, unspecified: Secondary | ICD-10-CM | POA: Diagnosis not present

## 2020-02-24 DIAGNOSIS — K219 Gastro-esophageal reflux disease without esophagitis: Secondary | ICD-10-CM | POA: Diagnosis not present

## 2020-02-24 DIAGNOSIS — I110 Hypertensive heart disease with heart failure: Secondary | ICD-10-CM | POA: Diagnosis not present

## 2020-02-24 DIAGNOSIS — E7849 Other hyperlipidemia: Secondary | ICD-10-CM | POA: Diagnosis not present

## 2020-02-24 DIAGNOSIS — R7989 Other specified abnormal findings of blood chemistry: Secondary | ICD-10-CM | POA: Diagnosis not present

## 2020-02-25 LAB — LIPID PANEL
Cholesterol: 186 mg/dL (ref <200)
HDL: 61 mg/dL (ref >=50)
LDL Cholesterol (Calc): 108 mg/dL (calc) — ABNORMAL HIGH
Non-HDL Cholesterol (Calc): 125 mg/dL (calc) (ref <130)
Total CHOL/HDL Ratio: 3 (calc) (ref <5.0)
Triglycerides: 76 mg/dL (ref <150)

## 2020-02-25 LAB — CBC
HCT: 33.9 % — ABNORMAL LOW (ref 35.0–45.0)
Hemoglobin: 11.3 g/dL — ABNORMAL LOW (ref 11.7–15.5)
MCH: 30 pg (ref 27.0–33.0)
MCHC: 33.3 g/dL (ref 32.0–36.0)
MCV: 89.9 fL (ref 80.0–100.0)
MPV: 14.8 fL — ABNORMAL HIGH (ref 7.5–12.5)
Platelets: 115 10*3/uL — ABNORMAL LOW (ref 140–400)
RBC: 3.77 10*6/uL — ABNORMAL LOW (ref 3.80–5.10)
RDW: 13.1 % (ref 11.0–15.0)
WBC: 4.3 10*3/uL (ref 3.8–10.8)

## 2020-02-25 LAB — TSH: TSH: 1.06 mIU/L (ref 0.40–4.50)

## 2020-02-25 LAB — IRON: Iron: 79 ug/dL (ref 45–160)

## 2020-02-25 LAB — FERRITIN: Ferritin: 868 ng/mL — ABNORMAL HIGH (ref 16–288)

## 2020-02-28 ENCOUNTER — Other Ambulatory Visit: Payer: Self-pay

## 2020-02-28 ENCOUNTER — Ambulatory Visit (INDEPENDENT_AMBULATORY_CARE_PROVIDER_SITE_OTHER): Payer: Medicare Other

## 2020-02-28 DIAGNOSIS — Z0000 Encounter for general adult medical examination without abnormal findings: Secondary | ICD-10-CM

## 2020-02-28 NOTE — Progress Notes (Signed)
Subjective:   Yolanda Foley is a 79 y.o. female who presents for Medicare Annual (Subsequent) preventive examination.       Objective:    There were no vitals filed for this visit. There is no height or weight on file to calculate BMI.  Advanced Directives 02/25/2019 02/18/2018 06/05/2016  Does Patient Have a Medical Advance Directive? No No No  Would patient like information on creating a medical advance directive? - Yes (ED - Information included in AVS) Yes (MAU/Ambulatory/Procedural Areas - Information given)    Current Medications (verified) Outpatient Encounter Medications as of 02/28/2020  Medication Sig  . amLODipine (NORVASC) 5 MG tablet Take 1 tablet (5 mg total) by mouth daily.  Marland Kitchen aspirin (ASPIRIN LOW DOSE) 81 MG EC tablet Take 81 mg by mouth as needed. Take one tablet by mouth once a day  . BLACK CURRANT SEED OIL PO Take 2 capsules by mouth daily.  Marland Kitchen Black Elderberry (SAMBUCUS ELDERBERRY PO) Take 1 capsule by mouth as needed.  . calcium-vitamin D (OSCAL-500) 500-400 MG-UNIT tablet Take 1 tablet by mouth 2 (two) times daily.  . Chlorophyll 50 MG CAPS Take 3 capsules by mouth daily.  . Coenzyme Q10-Fish Oil-Vit E (CO-Q 10 OMEGA-3 FISH OIL) CAPS Take 2 capsules by mouth daily.  Marland Kitchen denosumab (PROLIA) 60 MG/ML SOSY injection Inject 60 mg into the skin every 6 (six) months.  . Digestive Enzymes (PAPAYA ENZYME) CHEW Chew by mouth as needed.  . Fish Oil-Krill Oil (KRILL OIL PLUS) CAPS Take 1 capsule by mouth daily.  Nyoka Cowden Tea, Camillia sinensis, (GREEN TEA PO) Take by mouth. Patient states that this is a decaf tea and she drinks it 1-2 times daily  . IRON CR PO Take 80 mg by mouth daily.  Marland Kitchen MACA ROOT PO Take 523 mg by mouth 2 (two) times daily.   . Methylsulfonylmethane (MSM) 500 MG CAPS Take 1 capsule by mouth daily.   . Milk Thistle 200 MG CAPS Take 1 capsule by mouth 2 (two) times daily.  . Multiple Vitamin (MULTIVITAMIN) tablet Take 1 tablet by mouth daily. With iron  .  NATTOKINASE PO Take 50 mg by mouth daily.  . Turmeric (CURCUMIN 95 PO) Take by mouth 2 (two) times daily. Patient states that this is CURCUMIN only.  . ursodiol (ACTIGALL) 300 MG capsule TAKE 1 CAPSULE BY MOUTH THREE TIMES DAILY AFTER A MEAL  . vitamin B-12 (CYANOCOBALAMIN) 1000 MCG tablet Take 1,000 mcg by mouth daily.  . vitamin C (ASCORBIC ACID) 500 MG tablet Take 500 mg by mouth daily.    No facility-administered encounter medications on file as of 02/28/2020.    Allergies (verified) Fish allergy, Fluoxetine, Oxycodone, and Sulfonamide derivatives   History: Past Medical History:  Diagnosis Date  . Cholelithiasis   . Chronic pain   . Depression   . Elevated liver enzymes   . GERD (gastroesophageal reflux disease)   . Hyperlipidemia   . Hypertension   . Nonspecific elevation of levels of transaminase or lactic acid dehydrogenase (LDH)   . Palpitation   . 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 correction of nasal surgery  35 years ago   . Left ovarian cyst removal  1974  . ROTATOR CUFF REPAIR Left 08/2016  . TONSILLECTOMY  1967    Family History  Problem Relation Age of Onset  . Dementia Mother   . Heart failure Father   . Hypertension Father   .  Cancer Brother 75       kidney  . Cancer Maternal Aunt        breast  . Dementia Maternal Aunt    Social History   Socioeconomic History  . Marital status: Married    Spouse name: Not on file  . Number of children: 4  . Years of education: 30  . Highest education level: Associate degree: academic program  Occupational History  . Occupation: retired   Tobacco Use  . Smoking status: Never Smoker  . Smokeless tobacco: Never Used  Vaping Use  . Vaping Use: Never used  Substance and Sexual Activity  . Alcohol use: No  . Drug use: No  . Sexual activity: Not Currently  Other Topics Concern  . Not on file  Social History Narrative   Expressed to this nurse her  frustration with her husband his controlling ways.   Social Determinants of Health   Financial Resource Strain:   . Difficulty of Paying Living Expenses: Not on file  Food Insecurity:   . Worried About Charity fundraiser in the Last Year: Not on file  . Ran Out of Food in the Last Year: Not on file  Transportation Needs:   . Lack of Transportation (Medical): Not on file  . Lack of Transportation (Non-Medical): Not on file  Physical Activity:   . Days of Exercise per Week: Not on file  . Minutes of Exercise per Session: Not on file  Stress:   . Feeling of Stress : Not on file  Social Connections:   . Frequency of Communication with Friends and Family: Not on file  . Frequency of Social Gatherings with Friends and Family: Not on file  . Attends Religious Services: Not on file  . Active Member of Clubs or Organizations: Not on file  . Attends Archivist Meetings: Not on file  . Marital Status: Not on file    Tobacco Counseling Counseling given: Not Answered                  Diabetic? No          Activities of Daily Living No flowsheet data found.  Patient Care Team: Fayrene Helper, MD as PCP - General Leta Baptist, MD as Attending Physician (Otolaryngology) Rogene Houston, MD as Attending Physician (Gastroenterology) Leta Baptist, MD as Consulting Physician (Otolaryngology) Rogene Houston, MD as Consulting Physician (Gastroenterology)  Indicate any recent Medical Services you may have received from other than [facility] providers in the past year (date may be approximate).     Assessment:   This is a routine wellness examination for Yolanda Foley.  Hearing/Vision screen No exam data present  Dietary issues and exercise activities discussed:    Goals    . Exercise 3x per week (30 min per time)     Recommend increasing silver sneakers at least 3 times a week, or walking an additional day.    . Patient Stated     See her counselor more often and learn  to deal with emotional stress her husband puts her under       Depression Screen PHQ 2/9 Scores 02/09/2020 11/24/2019 03/15/2019 02/25/2019 09/29/2018 08/17/2018 03/08/2018  PHQ - 2 Score 1 0 1 0 1 1 1   PHQ- 9 Score - 3 6 - - - 6  Some encounter information is confidential and restricted. Go to Review Flowsheets activity to see all data.    Fall Risk Fall Risk  02/09/2020 11/24/2019 03/15/2019  02/25/2019 09/29/2018  Falls in the past year? 1 0 0 0 0  Number falls in past yr: 0 0 0 0 0  Injury with Fall? 1 0 0 0 0  Risk for fall due to : Impaired balance/gait - - - -  Follow up Falls evaluation completed - - Falls evaluation completed;Education provided -    Any stairs in or around the home? No  If so, are there any without handrails? No  Home free of loose throw rugs in walkways, pet beds, electrical cords, etc? Yes  Adequate lighting in your home to reduce risk of falls? Yes   ASSISTIVE DEVICES UTILIZED TO PREVENT FALLS:  Life alert? Yes  Use of a cane, walker or w/c? No  Grab bars in the bathroom? Yes  Shower chair or bench in shower? No  Elevated toilet seat or a handicapped toilet? Yes   TIMED UP AND GO:  Was the test performed? No .    Cognitive Function:     6CIT Screen 02/25/2019 02/18/2018 06/05/2016  What Year? 0 points 0 points 0 points  What month? 0 points 0 points 0 points  What time? 0 points 0 points 0 points  Count back from 20 0 points 0 points 0 points  Months in reverse 0 points 0 points 0 points  Repeat phrase 0 points 2 points 0 points  Total Score 0 2 0    Immunizations Immunization History  Administered Date(s) Administered  . Fluad Quad(high Dose 65+) 02/09/2020  . Influenza Split 02/05/2011  . Influenza Whole 01/21/2006, 05/31/2007, 02/25/2010  . Influenza,inj,Quad PF,6+ Mos 03/15/2013  . Moderna SARS-COVID-2 Vaccination 07/07/2019, 08/03/2019  . Pneumococcal Conjugate-13 05/30/2015  . Pneumococcal Polysaccharide-23 08/16/2008  . Td  08/16/2008    TDAP status: Up to date Flu Vaccine status: Up to date Pneumococcal vaccine status: Up to date Covid-19 vaccine status: Completed vaccines  Qualifies for Shingles Vaccine? No   Zostavax completed No   Shingrix Completed?: No.    Education has been provided regarding the importance of this vaccine. Patient has been advised to call insurance company to determine out of pocket expense if they have not yet received this vaccine. Advised may also receive vaccine at local pharmacy or Health Dept. Verbalized acceptance and understanding.  Screening Tests Health Maintenance  Topic Date Due  . TETANUS/TDAP  03/21/2021 (Originally 08/17/2018)  . INFLUENZA VACCINE  Completed  . DEXA SCAN  Completed  . COVID-19 Vaccine  Completed  . PNA vac Low Risk Adult  Completed    Health Maintenance  There are no preventive care reminders to display for this patient.  Colorectal cancer screening: No longer required.  Mammogram status: Completed normal. Repeat every year Bone Density: Complete   Lung Cancer Screening: (Low Dose CT Chest recommended if Age 49-80 years, 30 pack-year currently smoking OR have quit w/in 15years.) does not qualify.    Additional Screening:  Hepatitis C Screening: does qualify; Completed   Vision Screening: Recommended annual ophthalmology exams for early detection of glaucoma and other disorders of the eye. Is the patient up to date with their annual eye exam?  Yes    Who is the provider or what is the name of the office in which the patient attends annual eye exams? Dr. Lanell Matar   If pt is not established with a provider, would they like to be referred to a provider to establish care? No .   Dental Screening: Recommended annual dental exams for proper oral hygiene  Community  Resource Referral / Chronic Care Management: CRR required this visit?  No   CCM required this visit?  No      Plan:     I have personally reviewed and noted the following  in the patient's chart:   . Medical and social history . Use of alcohol, tobacco or illicit drugs  . Current medications and supplements . Functional ability and status . Nutritional status . Physical activity . Advanced directives . List of other physicians . Hospitalizations, surgeries, and ER visits in previous 12 months . Vitals . Screenings to include cognitive, depression, and falls . Referrals and appointments  In addition, I have reviewed and discussed with patient certain preventive protocols, quality metrics, and best practice recommendations. A written personalized care plan for preventive services as well as general preventive health recommendations were provided to patient.     Lonn Georgia, LPN   [id number]   Nurse Notes: AWV conducted over the phone today with pt consent to televisit via audio. Pt was in the home at the time of call and provider in the office. This call took approx 20 min. Pt did request Advanced Directive paperwork to look over.

## 2020-02-28 NOTE — Patient Instructions (Signed)
Yolanda Foley , Thank you for taking time to come for your Medicare Wellness Visit. I appreciate your ongoing commitment to your health goals. Please review the following plan we discussed and let me know if I can assist you in the future.   Screening recommendations/referrals: Colonoscopy: No longer required Mammogram: Completed, due annually  Bone Density: Completed  Recommended yearly ophthalmology/optometry visit for glaucoma screening and checkup Recommended yearly dental visit for hygiene and checkup  Vaccinations: Influenza vaccine: Completed  Pneumococcal vaccine: Completed  Tdap vaccine: Completed 03/21/2021  Shingles vaccine: Education provided.     Advanced directives: ACP paper work provided.   Conditions/risks identified: NONE   Next appointment: 05/16/2020 @ 9:00 am with Dr. Moshe Cipro    Preventive Care 65 Years and Older, Female Preventive care refers to lifestyle choices and visits with your health care provider that can promote health and wellness. What does preventive care include?  A yearly physical exam. This is also called an annual well check.  Dental exams once or twice a year.  Routine eye exams. Ask your health care provider how often you should have your eyes checked.  Personal lifestyle choices, including:  Daily care of your teeth and gums.  Regular physical activity.  Eating a healthy diet.  Avoiding tobacco and drug use.  Limiting alcohol use.  Practicing safe sex.  Taking low-dose aspirin every day.  Taking vitamin and mineral supplements as recommended by your health care provider. What happens during an annual well check? The services and screenings done by your health care provider during your annual well check will depend on your age, overall health, lifestyle risk factors, and family history of disease. Counseling  Your health care provider may ask you questions about your:  Alcohol use.  Tobacco use.  Drug use.  Emotional  well-being.  Home and relationship well-being.  Sexual activity.  Eating habits.  History of falls.  Memory and ability to understand (cognition).  Work and work Statistician.  Reproductive health. Screening  You may have the following tests or measurements:  Height, weight, and BMI.  Blood pressure.  Lipid and cholesterol levels. These may be checked every 5 years, or more frequently if you are over 10 years old.  Skin check.  Lung cancer screening. You may have this screening every year starting at age 49 if you have a 30-pack-year history of smoking and currently smoke or have quit within the past 15 years.  Fecal occult blood test (FOBT) of the stool. You may have this test every year starting at age 86.  Flexible sigmoidoscopy or colonoscopy. You may have a sigmoidoscopy every 5 years or a colonoscopy every 10 years starting at age 36.  Hepatitis C blood test.  Hepatitis B blood test.  Sexually transmitted disease (STD) testing.  Diabetes screening. This is done by checking your blood sugar (glucose) after you have not eaten for a while (fasting). You may have this done every 1-3 years.  Bone density scan. This is done to screen for osteoporosis. You may have this done starting at age 70.  Mammogram. This may be done every 1-2 years. Talk to your health care provider about how often you should have regular mammograms. Talk with your health care provider about your test results, treatment options, and if necessary, the need for more tests. Vaccines  Your health care provider may recommend certain vaccines, such as:  Influenza vaccine. This is recommended every year.  Tetanus, diphtheria, and acellular pertussis (Tdap, Td) vaccine. You may  need a Td booster every 10 years.  Zoster vaccine. You may need this after age 60.  Pneumococcal 13-valent conjugate (PCV13) vaccine. One dose is recommended after age 31.  Pneumococcal polysaccharide (PPSV23) vaccine. One  dose is recommended after age 33. Talk to your health care provider about which screenings and vaccines you need and how often you need them. This information is not intended to replace advice given to you by your health care provider. Make sure you discuss any questions you have with your health care provider. Document Released: 05/11/2015 Document Revised: 01/02/2016 Document Reviewed: 02/13/2015 Elsevier Interactive Patient Education  2017 [place] Prevention in the Home Falls can cause injuries. They can happen to people of all ages. There are many things you can do to make your home safe and to help prevent falls. What can I do on the outside of my home?  Regularly fix the edges of walkways and driveways and fix any cracks.  Remove anything that might make you trip as you walk through a door, such as a raised step or threshold.  Trim any bushes or trees on the path to your home.  Use bright outdoor lighting.  Clear any walking paths of anything that might make someone trip, such as rocks or tools.  Regularly check to see if handrails are loose or broken. Make sure that both sides of any steps have handrails.  Any raised decks and porches should have guardrails on the edges.  Have any leaves, snow, or ice cleared regularly.  Use sand or salt on walking paths during winter.  Clean up any spills in your garage right away. This includes oil or grease spills. What can I do in the bathroom?  Use night lights.  Install grab bars by the toilet and in the tub and shower. Do not use towel bars as grab bars.  Use non-skid mats or decals in the tub or shower.  If you need to sit down in the shower, use a plastic, non-slip stool.  Keep the floor dry. Clean up any water that spills on the floor as soon as it happens.  Remove soap buildup in the tub or shower regularly.  Attach bath mats securely with double-sided non-slip rug tape.  Do not have throw rugs and other  things on the floor that can make you trip. What can I do in the bedroom?  Use night lights.  Make sure that you have a light by your bed that is easy to reach.  Do not use any sheets or blankets that are too big for your bed. They should not hang down onto the floor.  Have a firm chair that has side arms. You can use this for support while you get dressed.  Do not have throw rugs and other things on the floor that can make you trip. What can I do in the kitchen?  Clean up any spills right away.  Avoid walking on wet floors.  Keep items that you use a lot in easy-to-reach places.  If you need to reach something above you, use a strong step stool that has a grab bar.  Keep electrical cords out of the way.  Do not use floor polish or wax that makes floors slippery. If you must use wax, use non-skid floor wax.  Do not have throw rugs and other things on the floor that can make you trip. What can I do with my stairs?  Do not leave any items on  the stairs.  Make sure that there are handrails on both sides of the stairs and use them. Fix handrails that are broken or loose. Make sure that handrails are as long as the stairways.  Check any carpeting to make sure that it is firmly attached to the stairs. Fix any carpet that is loose or worn.  Avoid having throw rugs at the top or bottom of the stairs. If you do have throw rugs, attach them to the floor with carpet tape.  Make sure that you have a light switch at the top of the stairs and the bottom of the stairs. If you do not have them, ask someone to add them for you. What else can I do to help prevent falls?  Wear shoes that:  Do not have high heels.  Have rubber bottoms.  Are comfortable and fit you well.  Are closed at the toe. Do not wear sandals.  If you use a stepladder:  Make sure that it is fully opened. Do not climb a closed stepladder.  Make sure that both sides of the stepladder are locked into place.  Ask  someone to hold it for you, if possible.  Clearly mark and make sure that you can see:  Any grab bars or handrails.  First and last steps.  Where the edge of each step is.  Use tools that help you move around (mobility aids) if they are needed. These include:  Canes.  Walkers.  Scooters.  Crutches.  Turn on the lights when you go into a dark area. Replace any light bulbs as soon as they burn out.  Set up your furniture so you have a clear path. Avoid moving your furniture around.  If any of your floors are uneven, fix them.  If there are any pets around you, be aware of where they are.  Review your medicines with your doctor. Some medicines can make you feel dizzy. This can increase your chance of falling. Ask your doctor what other things that you can do to help prevent falls. This information is not intended to replace advice given to you by your health care provider. Make sure you discuss any questions you have with your health care provider. Document Released: 02/08/2009 Document Revised: 09/20/2015 Document Reviewed: 05/19/2014 Elsevier Interactive Patient Education  2017 [employer].

## 2020-05-16 ENCOUNTER — Ambulatory Visit: Payer: Medicare Other | Admitting: Internal Medicine

## 2020-06-01 IMAGING — US ULTRASOUND ABDOMEN COMPLETE
1 series · 13 of 25 positions shown · non-contrast
Comparison: Ultrasound the abdomen of 08/16/2015

CLINICAL DATA: Primary biliary cirrhosis

EXAM:
ABDOMEN ULTRASOUND COMPLETE

[Series 1: ultrasound abdomen complete · 0.15mm/px · 13 of 149 slices shown]
[im 1/149]
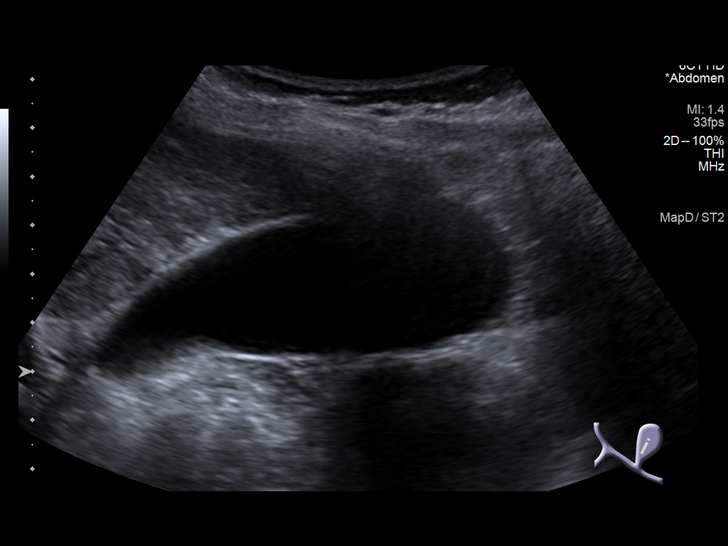
[im 13/149]
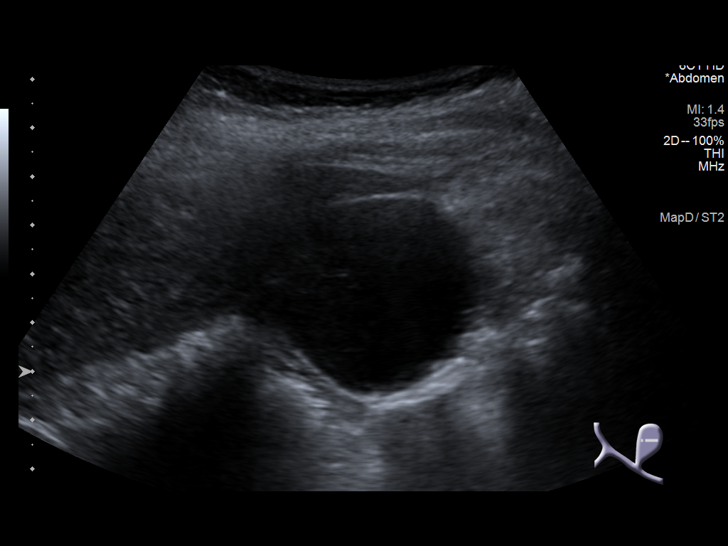
[im 25/149]
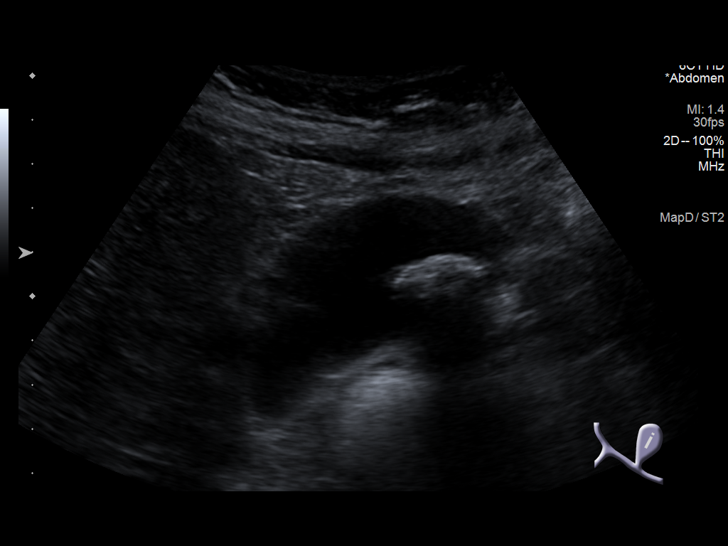
[im 38/149]
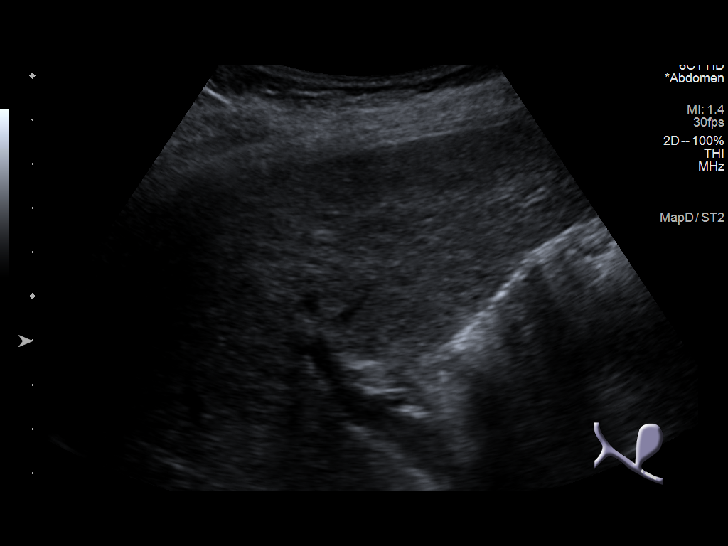
[im 50/149]
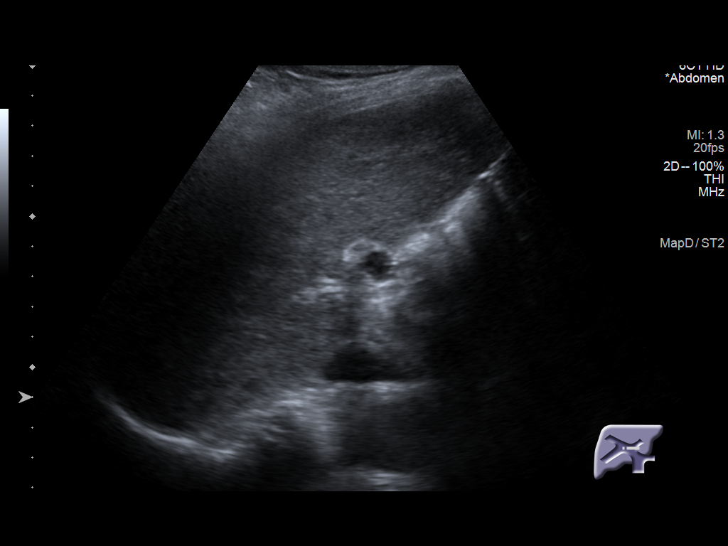
[im 62/149]
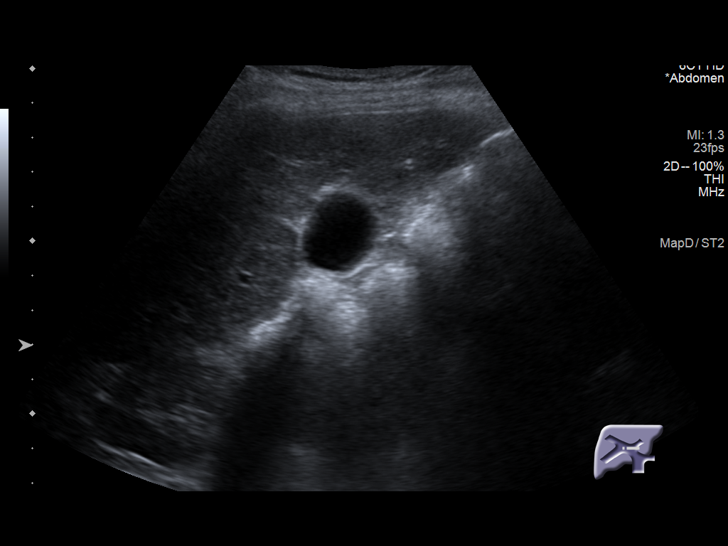
[im 75/149]
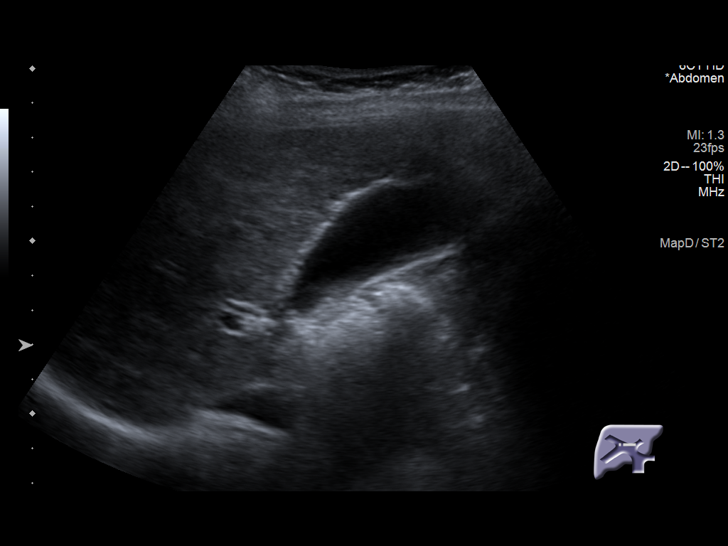
[im 87/149]
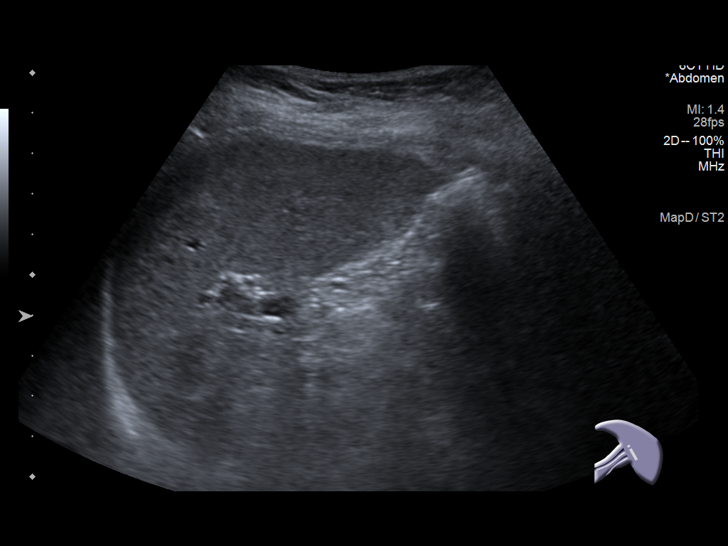
[im 99/149]
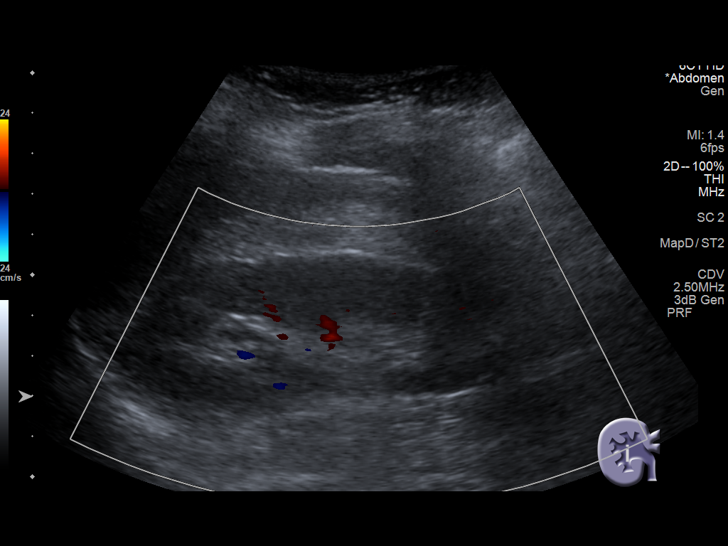
[im 112/149]
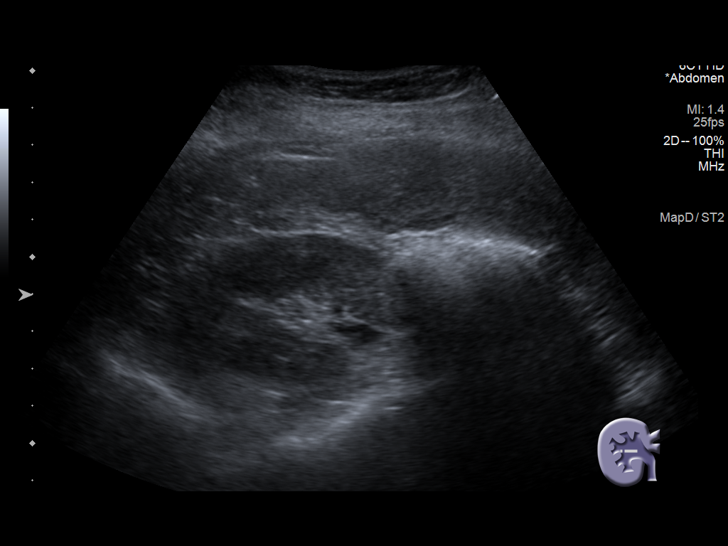
[im 124/149]
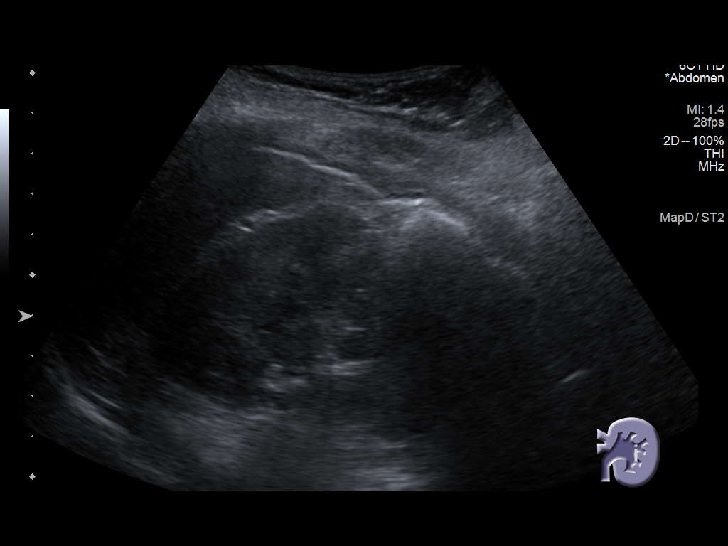
[im 136/149]
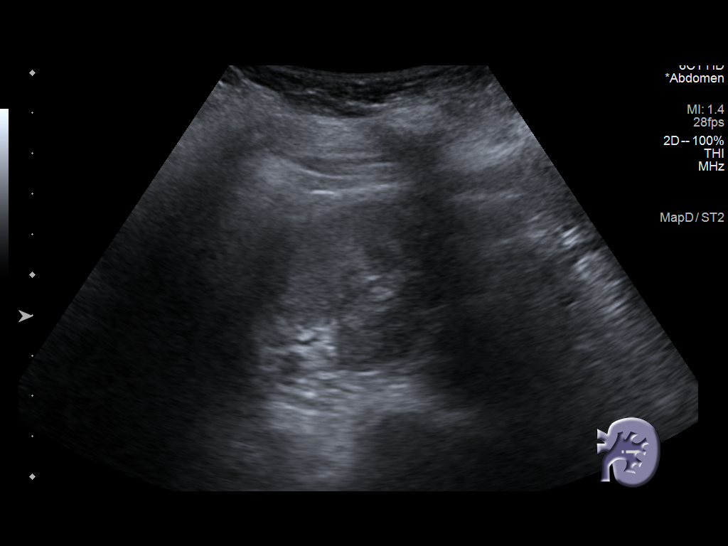
[im 149/149]
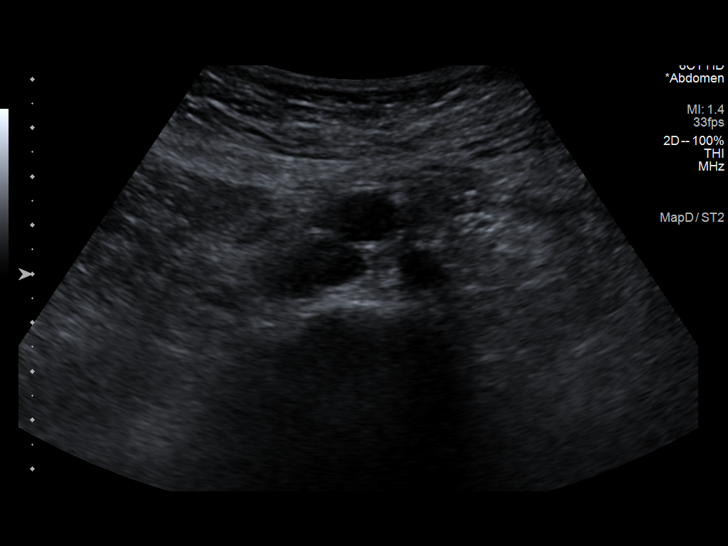

[13 of 25 positions shown; findings below may reference images not displayed]

FINDINGS: Gallbladder: The gallbladder is visualized again gallstones are
present. The largest gallstone measures 2.1 cm in diameter with
strong acoustical shadowing. There is no pain over the gallbladder
and the gallbladder wall is not thickened.

Common bile duct: Diameter: The common bile duct is normal measuring
3.1 mm in diameter.

Liver: The parenchyma of the liver is inhomogeneous and echogenic
with nodular contours consistent with changes of cirrhosis. No focal
hepatic abnormality is seen. Portal vein is patent on color Doppler
imaging with normal direction of blood flow towards the liver.

IVC: No abnormality visualized.

Pancreas: Only the mid body the pancreas appears normal. Portions of
the head and tail of the pancreas are obscured by bowel gas.

Spleen: The spleen measures 7.8 cm. A small echogenic focus within
the spleen measures 7 x 7 x 6 mm and is consistent with a small
hemangioma.

Right Kidney: Length: 10.5 cm.. No hydronephrosis is seen. The
echogenicity of the right kidney is within normal limits.

Left Kidney: Length: 10.9 cm.. No hydronephrosis is seen. The renal
parenchymal echogenicity also appears normal.

Abdominal aorta: The abdominal aorta is somewhat ectatic but no
aneurysm dilatation is seen.

Other findings: None.
IMPRESSION: 1. Gallstones, the largest of 2.1 cm in diameter. No present
evidence of acute cholecystitis by ultrasound.
2. Changes of cirrhosis.
3. Small splenic hemangioma of 7 mm in diameter.
4. No hydronephrosis.

## 2020-08-23 DIAGNOSIS — Z23 Encounter for immunization: Secondary | ICD-10-CM | POA: Diagnosis not present

## 2020-12-19 ENCOUNTER — Encounter: Payer: Self-pay | Admitting: Family Medicine

## 2020-12-20 ENCOUNTER — Other Ambulatory Visit: Payer: Self-pay

## 2020-12-20 ENCOUNTER — Other Ambulatory Visit (INDEPENDENT_AMBULATORY_CARE_PROVIDER_SITE_OTHER): Payer: Self-pay

## 2020-12-20 ENCOUNTER — Encounter (INDEPENDENT_AMBULATORY_CARE_PROVIDER_SITE_OTHER): Payer: Self-pay

## 2020-12-20 ENCOUNTER — Encounter (INDEPENDENT_AMBULATORY_CARE_PROVIDER_SITE_OTHER): Payer: Self-pay | Admitting: Gastroenterology

## 2020-12-20 ENCOUNTER — Ambulatory Visit (INDEPENDENT_AMBULATORY_CARE_PROVIDER_SITE_OTHER): Payer: Medicare Other | Admitting: Gastroenterology

## 2020-12-20 VITALS — BP 158/79 | HR 86 | Temp 97.9°F | Ht 65.0 in | Wt 146.0 lb

## 2020-12-20 DIAGNOSIS — K743 Primary biliary cirrhosis: Secondary | ICD-10-CM | POA: Diagnosis not present

## 2020-12-20 DIAGNOSIS — K7469 Other cirrhosis of liver: Secondary | ICD-10-CM

## 2020-12-20 DIAGNOSIS — D649 Anemia, unspecified: Secondary | ICD-10-CM | POA: Diagnosis not present

## 2020-12-20 MED ORDER — URSODIOL 300 MG PO CAPS: 300.0000 mg | ORAL_CAPSULE | Freq: Three times a day (TID) | ORAL | 6 refills | Status: DC

## 2020-12-20 NOTE — Patient Instructions (Addendum)
We will check blood work today to get updated lab results We will get you scheduled for an [place] of your liver and an EGD. Urso refill sent to your pharmacy, please take 3 pills per day, this is very important to control your PBC.  Continue to avoid NSAIDs (ibuprofen, advil, aleve, naproxen, good powders)

## 2020-12-20 NOTE — Progress Notes (Signed)
Referring Provider: Fayrene Helper, MD Primary Care Physician:  Fayrene Helper, MD Primary GI Physician: Yolanda Foley  Chief Complaint  Patient presents with   Follow-up    Followup. Pt states she was suppose to have a liver ultrasound about one year ago but had to put it off due to taking care of her husband. She is now ready to schedule. Concerned about weight loss and dizziness. States she not able to eat much at one time. Usually skips breaksfast. Has one stool a day, heartburn sometimes, dark stools - pt states due to a med she is taking,    HPI:   Yolanda Foley is a 80 y.o. female with past medical history of PBC, HTN, HLD, GERD, Cholelithiasis, depression and chronic pain.   Patient presenting today for follow up of [place].   PBC: (dx Aug 2012, positive AMA) last alk phos 192 (11/17/19), Urso in the past (2014-2021), however, she was mostly on subtherapeutic dose during that time due to non compliance with med, states she has not been taking this for some time due to not having been seen in clinic recently. Has not had any labs done to evaluate liver function in some time. Liver Elastography in 2017 suggested F2 and F3 disease. Last [place] 03/23/18 concerning for cirrhosis   She does endorse taking a lot of supplements for her other medical conditions as she does not like to take medications unless necessary.   Anemia: history of anemia, ferritin has been high in the past but she has had no recent labs. States she occasionally takes an iron pill if she is feeling more tired. She reports that she is feeling more fatigued lately as well as dizzy for about 6 months. She reports not sleeping well at night and she also does not eat full meals throughout the day. She has been referred to hematology in the past by her PCP r/t anemia w/ high ferritin and thrombocytopenia, but has been lost to follow up with them.   reports more weight loss over the past 3-4 months, states she has a good appetite  but is very busy helping care for her husband and will get up in the middle of her meal and forget to go back and finish it. She denies any black stools or blood in her stools. She reports some abdominal pain at times, depending on what she eats. Does have some reflux due to certain foods but tries to avoid these. She denies nausea or vomiting. She reports that occasionally pills have some trouble going down, but has no issues with dysphagia with her foods or liquids.   Last Colonoscopy:(07/16/09) internal and external hemorrhoids Last Endoscopy:n/a  Recommendations: Needs baseline EGD to evaluate for varices r/t hx of cirrhosis  Denies tobacco, alcohol, or NSAIDs.   Past Medical History:  Diagnosis Date   Cholelithiasis    Chronic pain    Depression    Elevated liver enzymes    GERD (gastroesophageal reflux disease)    Hyperlipidemia    Hypertension    Nonspecific elevation of levels of transaminase or lactic acid dehydrogenase (LDH)    Palpitation    Primary biliary cirrhosis (HCC)     Past Surgical History:  Procedure Laterality Date   correction of nasal surgery  35 years ago    Left ovarian cyst removal  1974   ROTATOR CUFF REPAIR Left 08/2016   TONSILLECTOMY  1967     Current Outpatient Medications  Medication Sig Dispense Refill  Black Elderberry (SAMBUCUS ELDERBERRY PO) Take 1 capsule by mouth as needed.     calcium-vitamin D (OSCAL-500) 500-400 MG-UNIT tablet Take 1 tablet by mouth 2 (two) times daily. 60 tablet 5   Chlorophyll 50 MG CAPS Take 3 capsules by mouth daily.     Digestive Enzymes (PAPAYA ENZYME) CHEW Chew by mouth as needed.     MACA ROOT PO Take 523 mg by mouth 2 (two) times daily.      Milk Thistle 200 MG CAPS Take 1 capsule by mouth 2 (two) times daily.     Multiple Vitamin (MULTIVITAMIN) tablet Take 1 tablet by mouth daily. With iron     vitamin B-12 (CYANOCOBALAMIN) 1000 MCG tablet Take 1,000 mcg by mouth daily.     vitamin C (ASCORBIC ACID) 500 MG  tablet Take 500 mg by mouth daily.      amLODipine (NORVASC) 5 MG tablet Take 1 tablet (5 mg total) by mouth daily. (Patient not taking: Reported on 12/20/2020) 30 tablet 5   aspirin 81 MG EC tablet Take 81 mg by mouth as needed. Take one tablet by mouth once a day (Patient not taking: Reported on 12/20/2020)     BLACK CURRANT SEED OIL PO Take 2 capsules by mouth daily. (Patient not taking: Reported on 12/20/2020)     Coenzyme Q10-Fish Oil-Vit E (CO-Q 10 OMEGA-3 FISH OIL) CAPS Take 2 capsules by mouth daily. (Patient not taking: Reported on 12/20/2020)     denosumab (PROLIA) 60 MG/ML SOSY injection Inject 60 mg into the skin every 6 (six) months. (Patient not taking: Reported on 12/20/2020) 1 mL 1   Fish Oil-Krill Oil (KRILL OIL PLUS) CAPS Take 1 capsule by mouth daily. (Patient not taking: Reported on 12/20/2020)     Green Tea, Camillia sinensis, (GREEN TEA PO) Take by mouth. Patient states that this is a decaf tea and she drinks it 1-2 times daily (Patient not taking: Reported on 12/20/2020)     IRON CR PO Take 80 mg by mouth daily. (Patient not taking: Reported on 12/20/2020)     Methylsulfonylmethane (MSM) 500 MG CAPS Take 1 capsule by mouth daily.  (Patient not taking: Reported on 12/20/2020)     NATTOKINASE PO Take 50 mg by mouth daily. (Patient not taking: Reported on 12/20/2020)     Turmeric (CURCUMIN 95 PO) Take by mouth 2 (two) times daily. Patient states that this is CURCUMIN only. (Patient not taking: Reported on 12/20/2020)     ursodiol (ACTIGALL) 300 MG capsule TAKE 1 CAPSULE BY MOUTH THREE TIMES DAILY AFTER A MEAL (Patient not taking: Reported on 12/20/2020) 90 capsule 0   No current facility-administered medications for this visit.    Allergies as of 12/20/2020 - Review Complete 12/20/2020  Allergen Reaction Noted   Fish allergy  11/20/2011   Fluoxetine Other (See Comments) 12/23/2012   Oxycodone Nausea And Vomiting 12/10/2016   Sulfonamide derivatives      Family History  Problem  Relation Age of Onset   Dementia Mother    Heart failure Father    Hypertension Father    Cancer Brother 6       kidney   Cancer Maternal Aunt        breast   Dementia Maternal Aunt     Social History   Socioeconomic History   Marital status: Married    Spouse name: Not on file   Number of children: 4   Years of education: 16   Highest education level: Associate degree: academic  program  Occupational History   Occupation: retired   Tobacco Use   Smoking status: Never   Smokeless tobacco: Never  Vaping Use   Vaping Use: Never used  Substance and Sexual Activity   Alcohol use: No   Drug use: No   Sexual activity: Not Currently  Other Topics Concern   Not on file  Social History Narrative   Expressed to this nurse her frustration with her husband his controlling ways.   Social Determinants of Health   Financial Resource Strain: Low Risk    Difficulty of Paying Living Expenses: Not very hard  Food Insecurity: No Food Insecurity   Worried About Charity fundraiser in the Last Year: Never true   Ran Out of Food in the Last Year: Never true  Transportation Needs: No Transportation Needs   Lack of Transportation (Medical): No   Lack of Transportation (Non-Medical): No  Physical Activity: Insufficiently Active   Days of Exercise per Week: 3 days   Minutes of Exercise per Session: 30 min  Stress: No Stress Concern Present   Feeling of Stress : Only a little  Social Connections: Moderately Integrated   Frequency of Communication with Friends and Family: More than three times a week   Frequency of Social Gatherings with Friends and Family: Three times a week   Attends Religious Services: More than 4 times per year   Active Member of Clubs or Organizations: No   Attends Archivist Meetings: Never   Marital Status: Married   Review of Systems: Gen: Denies fever, chills, anorexia. Endorses fatigue and weight loss, endorses dizziness CV: Denies chest pain,  palpitations, syncope, peripheral edema, and claudication. Resp: Denies dyspnea at rest, cough, wheezing, coughing up blood, and pleurisy. GI: Denies vomiting blood, jaundice, and fecal incontinence. Denies dysphagia or odynophagia. Derm: Denies rash, itching, dry skin Psych: Denies depression, anxiety, memory loss, confusion. No homicidal or suicidal ideation.  Heme: Denies bruising, bleeding, and enlarged lymph nodes.  Physical Exam: BP (!) 158/79 (BP Location: Left Arm, Patient Position: Sitting, Cuff Size: Large)   Pulse 86   Temp 97.9 F (36.6 C) (Oral)   Ht 5' 5"  (1.651 m)   Wt 157 lb (71.2 kg)   BMI 26.13 kg/m  General:   Alert and oriented. No distress noted. Pleasant and cooperative.  Head:  Normocephalic and atraumatic. Eyes:  Conjuctiva clear without scleral icterus. Mouth:  Oral mucosa pink and moist. Good dentition. No lesions. Heart: Normal rate and rhythm, s1 and s2 heart sounds present.  Lungs: Clear lung sounds in all lobes. Respirations equal and unlabored. Abdomen:  +BS, soft, non-tender and non-distended. No rebound or guarding. No HSM or masses noted. Derm: No palmar erythema or jaundice Msk:  Symmetrical without gross deformities. Normal posture. Extremities:  Without edema. Neurologic:  Alert and  oriented x4 Psych:  Alert and cooperative. Normal mood and affect.  Invalid input(s): 6 MONTHS   ASSESSMENT: TOMMI CREPEAU is a 80 y.o. female presenting today for follow up of [place]. Last visit to our clinic was January 2021. Patient was on Urso at that time, TID, although was taking subtherapeutic dose due to non compliance, however, she failed to have [place] and ordered labs completed and therefore Urso was not refilled for her. Last [place] in 2019 was concerning for cirrhosis. Elastography in 2017 showed F2 and F3 disease. She has also not had any updated labs recently, so unsure what recent alk phos is. We discussed the importance of continue  compliance with Urso at  prescribed dose (refilled today), as well as regular follow up visits with our clinic in order to make sure her PBC is well controlled to avoid worsening of her cirrhosis or development of other secondary issues. 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 She is aware that if she has any coffee ground emesis or hematemesis this is an emergency and she needs to go to the ER right away.   She also has a History of anemia with high ferritin levels previously, no recent labs to note a current hemoglobin, we will check CBC today. Patient complains of ongoing fatigue and weight loss. She has no red flag symptoms. 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 States she rarely finishes a meal due to being so busy and forgetting to eat. We discussed adding in ensure or boost shakes between meals, up to TID to help with some extra nutrition.   We discussed the importance of avoiding NSAIDs. She denies any melena, early satiety, BRBPR, nausea, vomiting or abdominal pain.   We will evaluate her tolerance of Urso at prescribed dose of TID after 1 year, consider adding ocalvia in the future depending on patient's tolerance of medication and lab work.   PLAN:  Will update labs today include AFP,INR, CBC, CMP, INR 2. Plan for EGD to evaluate for varices 3. Schedule liver elastography 4. Avoid NSAIDs 5. Boost/ensure shakes between meals, up to TID  Follow Up: 3 months  Case discussed with Dr. Jenetta Downer who is in agreement with plan of care, as outlined above.   Akaila Rambo L. Alver Sorrow, MSN, APRN, AGNP-C Adult-Gerontology Nurse Practitioner [facility]

## 2020-12-21 LAB — CBC
HCT: 34.9 % — ABNORMAL LOW (ref 35.0–45.0)
Hemoglobin: 11.1 g/dL — ABNORMAL LOW (ref 11.7–15.5)
MCH: 29.1 pg (ref 27.0–33.0)
MCHC: 31.8 g/dL — ABNORMAL LOW (ref 32.0–36.0)
MCV: 91.6 fL (ref 80.0–100.0)
MPV: 14.3 fL — ABNORMAL HIGH (ref 7.5–12.5)
Platelets: 88 10*3/uL — ABNORMAL LOW (ref 140–400)
RBC: 3.81 10*6/uL (ref 3.80–5.10)
RDW: 13.1 % (ref 11.0–15.0)
WBC: 4.4 10*3/uL (ref 3.8–10.8)

## 2020-12-21 LAB — COMPREHENSIVE METABOLIC PANEL
AG Ratio: 1 (calc) (ref 1.0–2.5)
ALT: 34 U/L — ABNORMAL HIGH (ref 6–29)
AST: 57 U/L — ABNORMAL HIGH (ref 10–35)
Albumin: 4.2 g/dL (ref 3.6–5.1)
Alkaline phosphatase (APISO): 277 U/L — ABNORMAL HIGH (ref 37–153)
BUN: 10 mg/dL (ref 7–25)
CO2: 30 mmol/L (ref 20–32)
Calcium: 9.2 mg/dL (ref 8.6–10.4)
Chloride: 102 mmol/L (ref 98–110)
Creat: 0.83 mg/dL (ref 0.60–0.95)
Globulin: 4.4 g/dL (calc) — ABNORMAL HIGH (ref 1.9–3.7)
Glucose, Bld: 86 mg/dL (ref 65–99)
Potassium: 3.5 mmol/L (ref 3.5–5.3)
Sodium: 138 mmol/L (ref 135–146)
Total Bilirubin: 0.5 mg/dL (ref 0.2–1.2)
Total Protein: 8.6 g/dL — ABNORMAL HIGH (ref 6.1–8.1)

## 2020-12-21 LAB — AFP TUMOR MARKER: AFP-Tumor Marker: 9 ng/mL — ABNORMAL HIGH

## 2020-12-21 LAB — PROTIME-INR
INR: 1
Prothrombin Time: 10.3 s (ref 9.0–11.5)

## 2020-12-21 NOTE — Patient Instructions (Signed)
Yolanda Foley  12/21/2020     '@PREFPERIOPPHARMACY'$ @   Your procedure is scheduled on  12/28/2020.   Report to [facility] at  1130  A.M.   Call this number if you have problems the morning of surgery:  [phone]   Remember:  Follow the diet instructions given to you by the office.    Take these medicines the morning of surgery with A SIP OF WATER  None    Do not wear jewelry, make-up or nail polish.  Do not wear lotions, powders, or perfumes, or deodorant.  Do not shave 48 hours prior to surgery.  Men may shave face and neck.  Do not bring valuables to the hospital.  [facility] is not responsible for any belongings or valuables.  Contacts, dentures or bridgework may not be worn into surgery.  Leave your suitcase in the car.  After surgery it may be brought to your room.  For patients admitted to the hospital, discharge time will be determined by your treatment team.  Patients discharged the day of surgery will not be allowed to drive home and must have someone with them for 24 hours.    Special instructions:   DO NOT smoke tobacco or vape for 24 hours before your procedure.  Please read over the following fact sheets that you were given. Anesthesia Post-op Instructions and Care and Recovery After Surgery      Upper Endoscopy, Adult, Care After This sheet gives you information about how to care for yourself after your procedure. Your health care provider may also give you more specific instructions. If you have problems or questions, contact your health careprovider. What can I expect after the procedure? After the procedure, it is common to have: A sore throat. Mild stomach pain or discomfort. Bloating. Nausea. Follow these instructions at home:  Follow instructions from your health care provider about what to eat or drink after your procedure. Return to your normal activities as told by your health care provider. Ask your health care provider what  activities are safe for you. Take over-the-counter and prescription medicines only as told by your health care provider. If you were given a sedative during the procedure, it can affect you for several hours. Do not drive or operate machinery until your health care provider says that it is safe. Keep all follow-up visits as told by your health care provider. This is important. Contact a health care provider if you have: A sore throat that lasts longer than one day. Trouble swallowing. Get help right away if: You vomit blood or your vomit looks like coffee grounds. You have: A fever. Bloody, black, or tarry stools. A severe sore throat or you cannot swallow. Difficulty breathing. Severe pain in your chest or abdomen. Summary After the procedure, it is common to have a sore throat, mild stomach discomfort, bloating, and nausea. If you were given a sedative during the procedure, it can affect you for several hours. Do not drive or operate machinery until your health care provider says that it is safe. Follow instructions from your health care provider about what to eat or drink after your procedure. Return to your normal activities as told by your health care provider. This information is not intended to replace advice given to you by your health care provider. Make sure you discuss any questions you have with your healthcare provider. Document Revised: 04/12/2019 Document Reviewed: 09/14/2017 Elsevier Patient Education  [place]  Anesthesia Care, Care After This sheet gives you information about how to care for yourself after your procedure. Your health care provider may also give you more specific instructions. If you have problems or questions, contact your health careprovider. What can I expect after the procedure? After the procedure, it is common to have: Tiredness. Forgetfulness about what happened after the procedure. Impaired judgment for important  decisions. Nausea or vomiting. Some difficulty with balance. Follow these instructions at home: For the time period you were told by your health care provider:     Rest as needed. Do not participate in activities where you could fall or become injured. Do not drive or use machinery. Do not drink alcohol. Do not take sleeping pills or medicines that cause drowsiness. Do not make important decisions or sign legal documents. Do not take care of children on your own. Eating and drinking Follow the diet that is recommended by your health care provider. Drink enough fluid to keep your urine pale yellow. If you vomit: Drink water, juice, or soup when you can drink without vomiting. Make sure you have little or no nausea before eating solid foods. General instructions Have a responsible adult stay with you for the time you are told. It is important to have someone help care for you until you are awake and alert. Take over-the-counter and prescription medicines only as told by your health care provider. If you have sleep apnea, surgery and certain medicines can increase your risk for breathing problems. Follow instructions from your health care provider about wearing your sleep device: Anytime you are sleeping, including during daytime naps. While taking prescription pain medicines, sleeping medicines, or medicines that make you drowsy. Avoid smoking. Keep all follow-up visits as told by your health care provider. This is important. Contact a health care provider if: You keep feeling nauseous or you keep vomiting. You feel light-headed. You are still sleepy or having trouble with balance after 24 hours. You develop a rash. You have a fever. You have redness or swelling around the IV site. Get help right away if: You have trouble breathing. You have new-onset confusion at home. Summary For several hours after your procedure, you may feel tired. You may also be forgetful and have poor  judgment. Have a responsible adult stay with you for the time you are told. It is important to have someone help care for you until you are awake and alert. Rest as told. Do not drive or operate machinery. Do not drink alcohol or take sleeping pills. Get help right away if you have trouble breathing, or if you suddenly become confused. This information is not intended to replace advice given to you by your health care provider. Make sure you discuss any questions you have with your healthcare provider. Document Revised: 12/29/2019 Document Reviewed: 03/17/2019 Elsevier Patient Education  2022 [employer].

## 2020-12-24 ENCOUNTER — Telehealth (INDEPENDENT_AMBULATORY_CARE_PROVIDER_SITE_OTHER): Payer: Self-pay | Admitting: *Deleted

## 2020-12-24 NOTE — Telephone Encounter (Signed)
Pt called to cancel EGD and reschedule for October. She did not know what days were good yet and states she will call back to schedule the EGD. Wanted to go ahead and cancel the one on 9/2  Pt's number [phone]

## 2020-12-26 ENCOUNTER — Encounter (HOSPITAL_COMMUNITY): Payer: Self-pay

## 2020-12-26 ENCOUNTER — Other Ambulatory Visit: Payer: Self-pay

## 2020-12-26 ENCOUNTER — Ambulatory Visit (HOSPITAL_COMMUNITY)
Admission: RE | Admit: 2020-12-26 | Discharge: 2020-12-26 | Disposition: A | Discharge status: Home / Self Care | Payer: Medicare Other | Source: Ambulatory Visit | Attending: Gastroenterology | Admitting: Gastroenterology

## 2020-12-26 ENCOUNTER — Encounter (HOSPITAL_COMMUNITY)
Admission: RE | Admit: 2020-12-26 | Discharge: 2020-12-26 | Disposition: A | Discharge status: Home / Self Care | Payer: Medicare Other | Source: Ambulatory Visit | Attending: Gastroenterology | Admitting: Gastroenterology

## 2020-12-26 DIAGNOSIS — K743 Primary biliary cirrhosis: Secondary | ICD-10-CM | POA: Diagnosis not present

## 2020-12-26 DIAGNOSIS — K7400 Hepatic fibrosis, unspecified: Secondary | ICD-10-CM | POA: Diagnosis not present

## 2020-12-27 ENCOUNTER — Encounter (HOSPITAL_COMMUNITY): Payer: Self-pay | Admitting: Anesthesiology

## 2020-12-28 ENCOUNTER — Ambulatory Visit (HOSPITAL_COMMUNITY): Admission: RE | Admit: 2020-12-28 | Payer: Medicare Other | Source: Ambulatory Visit | Admitting: Gastroenterology

## 2020-12-28 ENCOUNTER — Encounter (HOSPITAL_COMMUNITY): Admission: RE | Payer: Self-pay | Source: Ambulatory Visit

## 2020-12-28 SURGERY — ESOPHAGOGASTRODUODENOSCOPY (EGD) WITH PROPOFOL
Anesthesia: Monitor Anesthesia Care

## 2021-01-02 ENCOUNTER — Ambulatory Visit (INDEPENDENT_AMBULATORY_CARE_PROVIDER_SITE_OTHER): Payer: Medicare Other | Admitting: Family Medicine

## 2021-01-02 ENCOUNTER — Other Ambulatory Visit: Payer: Self-pay

## 2021-01-02 VITALS — BP 124/69 | HR 89 | Temp 98.8°F | Resp 18 | Ht 65.0 in | Wt 146.1 lb

## 2021-01-02 DIAGNOSIS — K8070 Calculus of gallbladder and bile duct without cholecystitis without obstruction: Secondary | ICD-10-CM | POA: Diagnosis not present

## 2021-01-02 DIAGNOSIS — H547 Unspecified visual loss: Secondary | ICD-10-CM | POA: Diagnosis not present

## 2021-01-02 DIAGNOSIS — R7303 Prediabetes: Secondary | ICD-10-CM

## 2021-01-02 DIAGNOSIS — I10 Essential (primary) hypertension: Secondary | ICD-10-CM

## 2021-01-02 DIAGNOSIS — E559 Vitamin D deficiency, unspecified: Secondary | ICD-10-CM

## 2021-01-02 DIAGNOSIS — Z1231 Encounter for screening mammogram for malignant neoplasm of breast: Secondary | ICD-10-CM | POA: Diagnosis not present

## 2021-01-02 DIAGNOSIS — F4329 Adjustment disorder with other symptoms: Secondary | ICD-10-CM

## 2021-01-02 DIAGNOSIS — E7849 Other hyperlipidemia: Secondary | ICD-10-CM

## 2021-01-02 DIAGNOSIS — F5104 Psychophysiologic insomnia: Secondary | ICD-10-CM

## 2021-01-02 DIAGNOSIS — F320 Major depressive disorder, single episode, mild: Secondary | ICD-10-CM | POA: Diagnosis not present

## 2021-01-02 MED ORDER — MIRTAZAPINE 7.5 MG PO TABS: 7.5000 mg | ORAL_TABLET | Freq: Every day | ORAL | 3 refills | Status: DC

## 2021-01-02 NOTE — Patient Instructions (Addendum)
F/JU in mid to end November, re evaluate chronic hea;lth cvonditions and flu vaccine  Please get fasting lipid, cmp and eGFr, tSH and vit D level 5 to 7 days before visit  Please schedule mammogram in [place] at checkout, past due  New for sleep, mild depression and appetite is remeron 7.5 mg one at bedtime  You are being referred to [place] for therapy, this will be on the telephone  Please do go ahead with procedure recommended  You are referred to dr Lanell Matar for eye exam  Thanks for choosing [facility], we consider it a privelige to serve you.

## 2021-01-06 ENCOUNTER — Encounter: Payer: Self-pay | Admitting: Family Medicine

## 2021-01-06 DIAGNOSIS — H547 Unspecified visual loss: Secondary | ICD-10-CM | POA: Insufficient documentation

## 2021-01-06 NOTE — Assessment & Plan Note (Signed)
Start remeron and refer for therapy, has poor apetite and poor sleep

## 2021-01-06 NOTE — Assessment & Plan Note (Signed)
reports increased and uncontrolled and uncontrolled stress, refer to therapy

## 2021-01-06 NOTE — Assessment & Plan Note (Signed)
Refer to opthal of her choice

## 2021-01-06 NOTE — Assessment & Plan Note (Signed)
Controlled, no change in medication DASH diet and commitment to daily physical activity for a minimum of 30 minutes discussed and encouraged, as a part of hypertension management. The importance of attaining a healthy weight is also discussed.  BP/Weight 01/02/2021 12/20/2020 02/09/2020 11/24/2019 03/15/2019 [id number]  Systolic BP A999333 0000000 [id number] 0000000 A999333 0000000  Diastolic BP 69 79 78 74 82 80 80  Wt. (Lbs) 146.12 146 157 167 163 163 168  BMI 24.32 24.3 25.89 27.79 27.12 27.12 29.76

## 2021-01-06 NOTE — Progress Notes (Signed)
   Yolanda Foley     MRN: [id number]      DOB: 1940-05-20   HPI Ms. Mcfate is here for follow up and re-evaluation of chronic medical conditions, medication management and review of any available recent lab and radiology data.  Preventive health is updated, specifically  Cancer screening and Immunization.   C/o increased anxiety , poor sleep, stressed, spouse deteriorating and she is finding it increasingly difficult to manage. Willing to take meds and get therapy Requests referral for eye exam Has questions as to whether she should proceed with upper endo, and I absolutely encourage her to follow through   ROS Denies recent fever or chills. Denies sinus pressure, nasal congestion, ear pain or sore throat. Denies chest congestion, productive cough or wheezing. Denies chest pains, palpitations and leg swelling  Denies dysuria, frequency, hesitancy or incontinence. Denies joint pain, swelling and limitation in mobility. Denies headaches, seizures, numbness, or tingling.  Denies skin break down or rash.   PE  BP 124/69   Pulse 89   Temp 98.8 F (37.1 C)   Resp 18   Ht '5\' 5"'$  (1.651 m)   Wt 146 lb 1.9 oz (66.3 kg)   SpO2 94%   BMI 24.32 kg/m   Patient alert and oriented and in no cardiopulmonary distress.  HEENT: No facial asymmetry, EOMI,     Neck supple .  Chest: Clear to auscultation bilaterally.  CVS: S1, S2 no murmurs, no S3.Regular rate.  ABD: Soft non tender.   Ext: No edema  MS: Adequate ROM spine, shoulders, hips and knees.  Skin: Intact, no ulcerations or rash noted.  Psych: Good eye contact, normal affect. Memory intacta anxious and mildly  depressed appearing.  CNS: CN 2-12 intact, power,  normal throughout.no focal deficits noted.   Assessment & Plan  Depression Start remeron and refer for therapy, has poor apetite and poor sleep  Essential hypertension Controlled, no change in medication DASH diet and commitment to daily physical activity for a  minimum of 30 minutes discussed and encouraged, as a part of hypertension management. The importance of attaining a healthy weight is also discussed.  BP/Weight 01/02/2021 12/20/2020 02/09/2020 11/24/2019 03/15/2019 [id number]  Systolic BP A999333 0000000 [id number] 99991111 0000000 A999333 0000000  Diastolic BP 69 79 78 74 82 80 80  Wt. (Lbs) 146.12 146 157 167 163 163 168  BMI 24.32 24.3 25.89 27.79 27.12 27.12 29.76       Stress and adjustment reaction reports increased and uncontrolled and uncontrolled stress, refer to therapy  Cholelithiasis May be contributing to poor appetite, will discuss at next visit, depending on her response to therapy, has opted for observation over the years  Insomnia Sleep hygiene reviewed and written information offered also. Prescription sent for  medication needed.   Reduced vision Refer to opthal of her choice

## 2021-01-06 NOTE — Assessment & Plan Note (Signed)
May be contributing to poor appetite, will discuss at next visit, depending on her response to therapy, has opted for observation over the years

## 2021-01-06 NOTE — Assessment & Plan Note (Signed)
Sleep hygiene reviewed and written information offered also. Prescription sent for  medication needed.  

## 2021-01-08 ENCOUNTER — Telehealth (HOSPITAL_COMMUNITY): Payer: Self-pay | Admitting: Psychiatry

## 2021-01-08 NOTE — Telephone Encounter (Signed)
Called to schedule NP appt per Dr. Griffin Dakin request

## 2021-01-10 DIAGNOSIS — Z23 Encounter for immunization: Secondary | ICD-10-CM | POA: Diagnosis not present

## 2021-01-10 DIAGNOSIS — Z1231 Encounter for screening mammogram for malignant neoplasm of breast: Secondary | ICD-10-CM | POA: Diagnosis not present

## 2021-01-14 ENCOUNTER — Ambulatory Visit (HOSPITAL_COMMUNITY): Payer: Medicare Other

## 2021-03-19 ENCOUNTER — Ambulatory Visit (INDEPENDENT_AMBULATORY_CARE_PROVIDER_SITE_OTHER): Payer: Medicare Other

## 2021-03-19 DIAGNOSIS — Z0000 Encounter for general adult medical examination without abnormal findings: Secondary | ICD-10-CM

## 2021-03-19 NOTE — Progress Notes (Signed)
Subjective:   Yolanda Foley is a 80 y.o. female who presents for Medicare Annual (Subsequent) preventive examination.  I connected with  Yolanda Foley on 03/19/21 by a audio enabled telemedicine application and verified that I am speaking with the correct person using two identifiers.  Patient Location: Home  Provider Location: Office/Clinic  I discussed the limitations of evaluation and management by telemedicine. The patient expressed understanding and agreed to proceed.   Review of Systems      Yolanda Foley , Thank you for taking time to come for your Medicare Wellness Visit. I appreciate your ongoing commitment to your health goals. Please review the following plan we discussed and let me know if I can assist you in the future.   These are the goals we discussed:  Goals      Exercise 3x per week (30 min per time)     Recommend increasing silver sneakers at least 3 times a week, or walking an additional day.     Patient Stated     See her counselor more often and learn to deal with emotional stress her husband puts her under      Protect My Health        Why is this important?   Screening tests can find diseases early when they are easier to treat.  Your doctor or nurse will talk with you about which tests are important for you.  Getting shots for common diseases like the flu and shingles will help prevent them.     Notes: Take better care of me.         This is a list of the screening recommended for you and due dates:  Health Maintenance  Topic Date Due   Zoster (Shingles) Vaccine (1 of 2) Never done   COVID-19 Vaccine (3 - Booster for Moderna series) 09/28/2019   Flu Shot  11/26/2020   Tetanus Vaccine  03/21/2021*   Pneumonia Vaccine  Completed   DEXA scan (bone density measurement)  Completed   HPV Vaccine  Aged Out  *Topic was postponed. The date shown is not the original due date.          Objective:    There were no vitals filed for this visit. There  is no height or weight on file to calculate BMI.  Advanced Directives 02/28/2020 02/25/2019 02/18/2018 06/05/2016  Does Patient Have a Medical Advance Directive? No No No No  Would patient like information on creating a medical advance directive? No - Patient declined - Yes (ED - Information included in AVS) Yes (MAU/Ambulatory/Procedural Areas - Information given)    Current Medications (verified) Outpatient Encounter Medications as of 03/19/2021  Medication Sig   amLODipine (NORVASC) 5 MG tablet Take 1 tablet (5 mg total) by mouth daily.   BLACK CURRANT SEED OIL PO Take 2 capsules by mouth daily. (Patient not taking: Reported on 12/20/2020)   Black Elderberry (SAMBUCUS ELDERBERRY PO) Take 1 capsule by mouth as needed.   calcium-vitamin D (OSCAL-500) 500-400 MG-UNIT tablet Take 1 tablet by mouth 2 (two) times daily.   Chlorophyll 50 MG CAPS Take 3 capsules by mouth daily.   Coenzyme Q10-Fish Oil-Vit E (CO-Q 10 OMEGA-3 FISH OIL) CAPS Take 2 capsules by mouth daily. (Patient not taking: Reported on 12/20/2020)   denosumab (PROLIA) 60 MG/ML SOSY injection Inject 60 mg into the skin every 6 (six) months. (Patient not taking: Reported on 12/20/2020)   Digestive Enzymes (PAPAYA ENZYME) CHEW Chew by mouth  as needed.   Fish Oil-Krill Oil (KRILL OIL PLUS) CAPS Take 1 capsule by mouth daily. (Patient not taking: Reported on 12/20/2020)   Green Tea, Camillia sinensis, (GREEN TEA PO) Take by mouth. Patient states that this is a decaf tea and she drinks it 1-2 times daily (Patient not taking: Reported on 12/20/2020)   IRON CR PO Take 80 mg by mouth daily. (Patient not taking: Reported on 12/20/2020)   MACA ROOT PO Take 523 mg by mouth 2 (two) times daily.    Methylsulfonylmethane (MSM) 500 MG CAPS Take 1 capsule by mouth daily.  (Patient not taking: Reported on 12/20/2020)   Milk Thistle 200 MG CAPS Take 1 capsule by mouth 2 (two) times daily.   mirtazapine (REMERON) 7.5 MG tablet Take 1 tablet (7.5 mg total) by  mouth at bedtime.   Multiple Vitamin (MULTIVITAMIN) tablet Take 1 tablet by mouth daily. With iron   NATTOKINASE PO Take 50 mg by mouth daily. (Patient not taking: Reported on 12/20/2020)   ursodiol (ACTIGALL) 300 MG capsule Take 1 capsule (300 mg total) by mouth 3 (three) times daily.   vitamin B-12 (CYANOCOBALAMIN) 1000 MCG tablet Take 1,000 mcg by mouth daily.   vitamin C (ASCORBIC ACID) 500 MG tablet Take 500 mg by mouth daily.    No facility-administered encounter medications on file as of 03/19/2021.    Allergies (verified) Fish allergy, Fluoxetine, Other, Oxycodone, and Sulfonamide derivatives   History: Past Medical History:  Diagnosis Date   Cholelithiasis    Chronic pain    Depression    Elevated liver enzymes    GERD (gastroesophageal reflux disease)    Hyperlipidemia    Hypertension    Nonspecific elevation of levels of transaminase or lactic acid dehydrogenase (LDH)    Palpitation    Primary biliary cirrhosis (HCC)    Past Surgical History:  Procedure Laterality Date   correction of nasal surgery  35 years ago    Left ovarian cyst removal  1974   ROTATOR CUFF REPAIR Left 08/2016   TONSILLECTOMY  1967    Family History  Problem Relation Age of Onset   Dementia Mother    Heart failure Father    Hypertension Father    Cancer Brother 3       kidney   Cancer Maternal Aunt        breast   Dementia Maternal Aunt    Social History   Socioeconomic History   Marital status: Married    Spouse name: Not on file   Number of children: 4   Years of education: 16   Highest education level: Associate degree: academic program  Occupational History   Occupation: retired   Tobacco Use   Smoking status: Never   Smokeless tobacco: Never  Scientific laboratory technician Use: Never used  Substance and Sexual Activity   Alcohol use: No   Drug use: No   Sexual activity: Not Currently  Other Topics Concern   Not on file  Social History Narrative   Expressed to this nurse her  frustration with her husband his controlling ways.   Social Determinants of Health   Financial Resource Strain: Not on file  Food Insecurity: Not on file  Transportation Needs: Not on file  Physical Activity: Not on file  Stress: Not on file  Social Connections: Not on file    Tobacco Counseling Counseling given: Not Answered   Clinical Intake:  Diabetic?No         Activities of Daily Living No flowsheet data found.  Patient Care Team: Fayrene Helper, MD as PCP - General Leta Baptist, MD as Attending Physician (Otolaryngology) Rogene Houston, MD as Attending Physician (Gastroenterology) Leta Baptist, MD as Consulting Physician (Otolaryngology) Rogene Houston, MD as Consulting Physician (Gastroenterology)  Indicate any recent Medical Services you may have received from other than [facility] providers in the past year (date may be approximate).     Assessment:   This is a routine wellness examination for Whitney.  Hearing/Vision screen No results found.  Dietary issues and exercise activities discussed:     Goals Addressed   None   Depression Screen PHQ 2/9 Scores 01/02/2021 01/02/2021 02/28/2020 02/28/2020 02/09/2020 11/24/2019 03/15/2019  PHQ - 2 Score 2 1 0 0 1 0 1  PHQ- 9 Score 11 - - - - 3 6  Some encounter information is confidential and restricted. Go to Review Flowsheets activity to see all data.    Fall Risk Fall Risk  01/02/2021 02/28/2020 02/09/2020 11/24/2019 03/15/2019  Falls in the past year? 0 0 1 0 0  Number falls in past yr: 0 0 0 0 0  Injury with Fall? 0 0 1 0 0  Risk for fall due to : - No Fall Risks Impaired balance/gait - -  Follow up - Falls evaluation completed Falls evaluation completed - -    FALL RISK PREVENTION PERTAINING TO THE HOME:  Any stairs in or around the home?    If so, are there any without handrails?    Home free of loose throw rugs in walkways, pet beds, electrical cords, etc?    Adequate lighting in  your home to reduce risk of falls?     ASSISTIVE DEVICES UTILIZED TO PREVENT FALLS:  Life alert?    Use of a cane, walker or w/c?    Grab bars in the bathroom?    Shower chair or bench in shower?    Elevated toilet seat or a handicapped toilet?       Cognitive Function:     6CIT Screen 02/28/2020 02/25/2019 02/18/2018 06/05/2016  What Year? 0 points 0 points 0 points 0 points  What month? 0 points 0 points 0 points 0 points  What time? 0 points 0 points 0 points 0 points  Count back from 20 0 points 0 points 0 points 0 points  Months in reverse 0 points 0 points 0 points 0 points  Repeat phrase 0 points 0 points 2 points 0 points  Total Score 0 0 2 0    Immunizations Immunization History  Administered Date(s) Administered   Fluad Quad(high Dose 65+) 02/09/2020   Influenza Split 02/05/2011   Influenza Whole 01/21/2006, 05/31/2007, 02/25/2010   Influenza,inj,Quad PF,6+ Mos 03/15/2013   Pneumococcal Conjugate-13 05/30/2015   Pneumococcal Polysaccharide-23 08/16/2008   Td 08/16/2008    TDAP status: Due, Education has been provided regarding the importance of this vaccine. Advised may receive this vaccine at local pharmacy or Health Dept. Aware to provide a copy of the vaccination record if obtained from local pharmacy or Health Dept. Verbalized acceptance and understanding.  Flu Vaccine status: Due, Education has been provided regarding the importance of this vaccine. Advised may receive this vaccine at local pharmacy or Health Dept. Aware to provide a copy of the vaccination record if obtained from local pharmacy or Health Dept. Verbalized acceptance and understanding.  Pneumococcal vaccine status: Up to date  Covid-19 vaccine  status: Completed vaccines  Qualifies for Shingles Vaccine? Yes   Zostavax completed No   Shingrix Completed?: No.    Education has been provided regarding the importance of this vaccine. Patient has been advised to call insurance company to determine  out of pocket expense if they have not yet received this vaccine. Advised may also receive vaccine at local pharmacy or Health Dept. Verbalized acceptance and understanding.  Screening Tests Health Maintenance  Topic Date Due   Zoster Vaccines- Shingrix (1 of 2) Never done   COVID-19 Vaccine (3 - Booster for Moderna series) 09/28/2019   INFLUENZA VACCINE  11/26/2020   TETANUS/TDAP  03/21/2021 (Originally 08/17/2018)   Pneumonia Vaccine 8+ Years old  Completed   DEXA SCAN  Completed   HPV VACCINES  Aged Out    Health Maintenance  Health Maintenance Due  Topic Date Due   Zoster Vaccines- Shingrix (1 of 2) Never done   COVID-19 Vaccine (3 - Booster for Moderna series) 09/28/2019   INFLUENZA VACCINE  11/26/2020    Colorectal cancer screening: No longer required.   Mammogram status: No longer required due to age.  Bone Density status: Completed 12/23/19. Results reflect: Bone density results: OSTEOPOROSIS. Repeat every 2 years.  Lung Cancer Screening: (Low Dose CT Chest recommended if Age 39-80 years, 30 pack-year currently smoking OR have quit w/in 15years.) does qualify.   Lung Cancer Screening Referral: ordered 03/19/21  Additional Screening:  Hepatitis C Screening: does qualify; Completed ordered 03/19/21  Vision Screening: Recommended annual ophthalmology exams for early detection of glaucoma and other disorders of the eye. Is the patient up to date with their annual eye exam?     Who is the provider or what is the name of the office in which the patient attends annual eye exams?  If pt is not established with a provider, would they like to be referred to a provider to establish care?    .   Dental Screening: Recommended annual dental exams for proper oral hygiene  Community Resource Referral / Chronic Care Management: CRR required this visit?  No   CCM required this visit?  No      Plan:     I have personally reviewed and noted the following in the patient's  chart:   Medical and social history Use of alcohol, tobacco or illicit drugs  Current medications and supplements including opioid prescriptions.  Functional ability and status Nutritional status Physical activity Advanced directives List of other physicians Hospitalizations, surgeries, and ER visits in previous 12 months Vitals Screenings to include cognitive, depression, and falls Referrals and appointments  In addition, I have reviewed and discussed with patient certain preventive protocols, quality metrics, and best practice recommendations. A written personalized care plan for preventive services as well as general preventive health recommendations were provided to patient.     Quentin Angst, [place]   03/19/2021   Nurse Notes: error

## 2021-03-20 ENCOUNTER — Ambulatory Visit: Payer: Medicare Other | Admitting: Family Medicine

## 2021-06-28 ENCOUNTER — Telehealth (HOSPITAL_COMMUNITY): Payer: Self-pay | Admitting: Psychiatry

## 2021-06-28 NOTE — Telephone Encounter (Signed)
Called to schedule NP appt left detailed vm to return call to schedule with Maurice Small ?

## 2021-08-01 ENCOUNTER — Other Ambulatory Visit: Payer: Self-pay

## 2021-08-01 ENCOUNTER — Telehealth: Payer: Self-pay | Admitting: Family Medicine

## 2021-08-01 DIAGNOSIS — E559 Vitamin D deficiency, unspecified: Secondary | ICD-10-CM

## 2021-08-01 DIAGNOSIS — E7849 Other hyperlipidemia: Secondary | ICD-10-CM

## 2021-08-01 DIAGNOSIS — I10 Essential (primary) hypertension: Secondary | ICD-10-CM | POA: Diagnosis not present

## 2021-08-01 NOTE — Telephone Encounter (Signed)
Appointment scheduled 08/13/2021. ?

## 2021-08-01 NOTE — Telephone Encounter (Signed)
Patient called in regard to recent tooth extraction. ? ?Patient had teeth pulled and lost a lot of blood. ? ?Patient is feeling weak and wants to have hemoglobin checked. Does not want to go into weekend feeling weak ? ? ?Patient wants a call back. ?

## 2021-08-02 LAB — CBC WITH DIFFERENTIAL/PLATELET
Basophils Absolute: 0 10*3/uL (ref 0.0–0.2)
Basos: 0 %
EOS (ABSOLUTE): 0.1 10*3/uL (ref 0.0–0.4)
Eos: 3 %
Hematocrit: 31.8 % — ABNORMAL LOW (ref 34.0–46.6)
Hemoglobin: 10.5 g/dL — ABNORMAL LOW (ref 11.1–15.9)
Immature Grans (Abs): 0 10*3/uL (ref 0.0–0.1)
Immature Granulocytes: 1 %
Lymphocytes Absolute: 1.2 10*3/uL (ref 0.7–3.1)
Lymphs: 30 %
MCH: 29.8 pg (ref 26.6–33.0)
MCHC: 33 g/dL (ref 31.5–35.7)
MCV: 90 fL (ref 79–97)
Monocytes Absolute: 0.3 10*3/uL (ref 0.1–0.9)
Monocytes: 8 %
Neutrophils Absolute: 2.4 10*3/uL (ref 1.4–7.0)
Neutrophils: 58 %
Platelets: 105 10*3/uL — ABNORMAL LOW (ref 150–450)
RBC: 3.52 x10E6/uL — ABNORMAL LOW (ref 3.77–5.28)
RDW: 12.8 % (ref 11.7–15.4)
WBC: 4 10*3/uL (ref 3.4–10.8)

## 2021-08-02 LAB — CMP14+EGFR
ALT: 18 IU/L (ref 0–32)
AST: 35 IU/L (ref 0–40)
Albumin/Globulin Ratio: 1.1 — ABNORMAL LOW (ref 1.2–2.2)
Albumin: 4.1 g/dL (ref 3.6–4.6)
Alkaline Phosphatase: 158 IU/L — ABNORMAL HIGH (ref 44–121)
BUN/Creatinine Ratio: 14 (ref 12–28)
BUN: 10 mg/dL (ref 8–27)
Bilirubin Total: 0.4 mg/dL (ref 0.0–1.2)
CO2: 25 mmol/L (ref 20–29)
Calcium: 9.3 mg/dL (ref 8.7–10.3)
Chloride: 103 mmol/L (ref 96–106)
Creatinine, Ser: 0.73 mg/dL (ref 0.57–1.00)
Globulin, Total: 3.9 g/dL (ref 1.5–4.5)
Glucose: 84 mg/dL (ref 70–99)
Potassium: 4.3 mmol/L (ref 3.5–5.2)
Sodium: 141 mmol/L (ref 134–144)
Total Protein: 8 g/dL (ref 6.0–8.5)
eGFR: 82 mL/min/{1.73_m2} (ref >59)

## 2021-08-02 LAB — LIPID PANEL
Chol/HDL Ratio: 2.8 ratio (ref 0.0–4.4)
Cholesterol, Total: 180 mg/dL (ref 100–199)
HDL: 64 mg/dL (ref >39)
LDL Chol Calc (NIH): 104 mg/dL — ABNORMAL HIGH (ref 0–99)
Triglycerides: 64 mg/dL (ref 0–149)
VLDL Cholesterol Cal: 12 mg/dL (ref 5–40)

## 2021-08-02 LAB — VITAMIN D 25 HYDROXY (VIT D DEFICIENCY, FRACTURES): Vit D, 25-Hydroxy: 31.9 ng/mL (ref 30.0–100.0)

## 2021-08-02 LAB — TSH: TSH: 1.06 u[IU]/mL (ref 0.450–4.500)

## 2021-08-06 ENCOUNTER — Other Ambulatory Visit: Payer: Self-pay

## 2021-08-06 DIAGNOSIS — E559 Vitamin D deficiency, unspecified: Secondary | ICD-10-CM

## 2021-08-06 DIAGNOSIS — D649 Anemia, unspecified: Secondary | ICD-10-CM

## 2021-08-13 ENCOUNTER — Ambulatory Visit (INDEPENDENT_AMBULATORY_CARE_PROVIDER_SITE_OTHER): Payer: Medicare Other | Admitting: Family Medicine

## 2021-08-13 ENCOUNTER — Encounter: Payer: Self-pay | Admitting: Family Medicine

## 2021-08-13 VITALS — BP 155/87 | HR 82 | Resp 16 | Ht 65.0 in | Wt 147.1 lb

## 2021-08-13 DIAGNOSIS — T300 Burn of unspecified body region, unspecified degree: Secondary | ICD-10-CM

## 2021-08-13 DIAGNOSIS — D696 Thrombocytopenia, unspecified: Secondary | ICD-10-CM | POA: Diagnosis not present

## 2021-08-13 DIAGNOSIS — E7849 Other hyperlipidemia: Secondary | ICD-10-CM

## 2021-08-13 DIAGNOSIS — I10 Essential (primary) hypertension: Secondary | ICD-10-CM | POA: Diagnosis not present

## 2021-08-13 DIAGNOSIS — K743 Primary biliary cirrhosis: Secondary | ICD-10-CM

## 2021-08-13 DIAGNOSIS — D649 Anemia, unspecified: Secondary | ICD-10-CM | POA: Diagnosis not present

## 2021-08-13 MED ORDER — OLMESARTAN MEDOXOMIL 20 MG PO TABS: 20.0000 mg | ORAL_TABLET | Freq: Every day | ORAL | 3 refills | Status: DC
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
## 2021-08-15 ENCOUNTER — Encounter (INDEPENDENT_AMBULATORY_CARE_PROVIDER_SITE_OTHER): Payer: Self-pay | Admitting: *Deleted

## 2021-08-26 ENCOUNTER — Encounter: Payer: Self-pay | Admitting: Family Medicine

## 2021-08-26 DIAGNOSIS — T300 Burn of unspecified body region, unspecified degree: Secondary | ICD-10-CM | POA: Insufficient documentation

## 2021-08-26 NOTE — Assessment & Plan Note (Signed)
Cleaned with NS and dressing applied, should use tagaderm at home until healed ?

## 2021-08-26 NOTE — Assessment & Plan Note (Signed)
Uncontrolled, has reported intolerance to meds in the past , start with olmesartan 20 mg daily and re eval in 6 to 8 weeks ?DASH diet and commitment to daily physical activity for a minimum of 30 minutes discussed and encouraged, as a part of hypertension management. ?The importance of attaining a healthy weight is also discussed. ? ? ?  08/13/2021  ?  3:41 PM 08/13/2021  ?  3:40 PM 08/13/2021  ?  2:48 PM 01/02/2021  ?  3:02 PM 12/20/2020  ?  9:08 AM 02/09/2020  ?  2:06 PM 11/24/2019  ?  3:08 PM  ?BP/Weight  ?Systolic BP 749 355 217 471 158 150 155  ?Diastolic BP 87 84 87 69 79 78 74  ?Wt. (Lbs)   147.12 146.12 146 157 167  ?BMI   24.48 kg/m2 24.32 kg/m2 24.3 kg/m2 25.89 kg/m2 27.79 kg/m2  ? ? ? ? ?

## 2021-08-26 NOTE — Assessment & Plan Note (Signed)
Hyperlipidemia:Low fat diet discussed and encouraged. ? ? ?Lipid Panel  ?Lab Results  ?Component Value Date  ? CHOL 180 08/01/2021  ? HDL 64 08/01/2021  ? LDLCALC 104 (H) 08/01/2021  ? TRIG 64 08/01/2021  ? CHOLHDL 2.8 08/01/2021  ? ? ? ? ?

## 2021-08-26 NOTE — Assessment & Plan Note (Signed)
Follow up past due referred so appt can be made, advised to take ursodipol as prescrinbed ?

## 2021-08-26 NOTE — Assessment & Plan Note (Signed)
Refer hematology eval low platelet count and anemia with high ferritin ?

## 2021-08-26 NOTE — Assessment & Plan Note (Signed)
Refer hematology, low platelet and low HB ?

## 2021-09-04 DIAGNOSIS — D649 Anemia, unspecified: Secondary | ICD-10-CM | POA: Diagnosis not present

## 2021-09-04 DIAGNOSIS — D696 Thrombocytopenia, unspecified: Secondary | ICD-10-CM | POA: Diagnosis not present

## 2021-09-04 DIAGNOSIS — I10 Essential (primary) hypertension: Secondary | ICD-10-CM | POA: Diagnosis not present

## 2021-09-04 DIAGNOSIS — D569 Thalassemia, unspecified: Secondary | ICD-10-CM | POA: Diagnosis not present

## 2021-09-04 DIAGNOSIS — M810 Age-related osteoporosis without current pathological fracture: Secondary | ICD-10-CM | POA: Diagnosis not present

## 2021-09-04 DIAGNOSIS — R7989 Other specified abnormal findings of blood chemistry: Secondary | ICD-10-CM | POA: Diagnosis not present

## 2021-09-19 ENCOUNTER — Ambulatory Visit: Payer: Medicare Other | Admitting: Family Medicine

## 2021-09-25 DIAGNOSIS — D7589 Other specified diseases of blood and blood-forming organs: Secondary | ICD-10-CM | POA: Diagnosis not present

## 2021-09-25 DIAGNOSIS — D696 Thrombocytopenia, unspecified: Secondary | ICD-10-CM | POA: Diagnosis not present

## 2021-09-25 DIAGNOSIS — I10 Essential (primary) hypertension: Secondary | ICD-10-CM | POA: Diagnosis not present

## 2021-09-25 DIAGNOSIS — R768 Other specified abnormal immunological findings in serum: Secondary | ICD-10-CM | POA: Diagnosis not present

## 2021-09-25 DIAGNOSIS — R7989 Other specified abnormal findings of blood chemistry: Secondary | ICD-10-CM | POA: Diagnosis not present

## 2021-09-25 DIAGNOSIS — E871 Hypo-osmolality and hyponatremia: Secondary | ICD-10-CM | POA: Diagnosis not present

## 2021-09-25 DIAGNOSIS — L568 Other specified acute skin changes due to ultraviolet radiation: Secondary | ICD-10-CM | POA: Diagnosis not present

## 2021-09-25 DIAGNOSIS — D649 Anemia, unspecified: Secondary | ICD-10-CM | POA: Diagnosis not present

## 2021-09-26 ENCOUNTER — Ambulatory Visit (INDEPENDENT_AMBULATORY_CARE_PROVIDER_SITE_OTHER): Payer: Medicare Other | Admitting: Gastroenterology

## 2021-09-26 ENCOUNTER — Encounter (INDEPENDENT_AMBULATORY_CARE_PROVIDER_SITE_OTHER): Payer: Self-pay | Admitting: Gastroenterology

## 2021-09-26 VITALS — BP 140/67 | HR 85 | Temp 98.0°F | Ht 65.0 in | Wt 148.7 lb

## 2021-09-26 DIAGNOSIS — K7469 Other cirrhosis of liver: Secondary | ICD-10-CM | POA: Diagnosis not present

## 2021-09-26 DIAGNOSIS — K743 Primary biliary cirrhosis: Secondary | ICD-10-CM | POA: Diagnosis not present

## 2021-09-26 NOTE — Patient Instructions (Signed)
Schedule liver US Perform blood workup Schedule EGD Continue ursodiol 300 mg TID - Reduce salt intake to <2 g per day - Can take Tylenol max of 2 g per day (650 mg q8h) for pain - Avoid NSAIDs for pain - Avoid eating raw oysters/shellfish - Protein shake (Ensure or Boost) every night before going to sleep

## 2021-09-26 NOTE — Progress Notes (Unsigned)
Maylon Peppers, M.D. Gastroenterology & Hepatology [facility] [address] [place], [place] [address]  Primary Care Physician: Fayrene Helper, MD [address] [place] [address]  I will communicate my assessment and recommendations to the referring MD via EMR.  Problems: PBC cirrhosis  History of Present Illness: Yolanda Foley is a 81 y.o. female with PMH PBC cirrhosis, HTN, HLD, GERD, Cholelithiasis, chronic anemia, depression and chronic pain, who presents for follow up of PBC cirrhosis.  The patient was last seen on 12/20/2020.  She was scheduled for an EGD for esophageal varices screening.  Liver elastography was performed on 12/26/2020 which showed a low kPa (4.2) but with presence of heterogeneous texture which raised the concern for mild/early cirrhosis.  Patient canceled her EGD as her husband was sick at that time and did not reschedule.  Patient reports feeling well and denies having any complaints at the moment. The patient denies having any nausea, vomiting, fever,  hematochezia, melena, hematemesis, abdominal distention, abdominal pain, diarrhea, jaundice, pruritus. Has lost weight through the years, but for the last 6 months she gained close to 6 lb.  She reports chronic history of anemia.  She is currently Following at [facility] for this.  In her last visit on 09/25/2021 the patient was recommended to undergo a rheumatology evaluation for possible lupus and to undergo an abdominal ultrasound to rule out hypersplenism.  Patient is on Actigall 300 mg TID and has been tolerating the medication adequately.  Most recent labs were performed on 09/25/2021.  CBC showed a hemoglobin of 10.5, MCV 93, although cell count 4.0, platelets 71,000, CMP showed normal sodium of 139, potassium of 4.1, creatinine 0.77, AST 31, ALT 24, alkaline phosphatase 140, albumin 3.7, ferritin was elevated 579 with normal iron of  75, iron saturation of 29% and TIBC of 262.  Cirrhosis related questions: Hematemesis/coffee ground emesis: No History of variceal bleeding: No Abdominal pain: No Abdominal distention/worsening ascitesNo Fever/chills: No Episodes of confusion/disorientation: No Taking diuretics?: No Date of last EGD: Never Prior history of banding?: No Prior episodes of SBP: No Last time liver imaging was performed:12/26/2020 - liver elastography - no liver masses MELD score: 7  Last Colonoscopy:(07/16/09) internal and external hemorrhoids  Past Medical History: Past Medical History:  Diagnosis Date   Cholelithiasis    Chronic pain    Depression    Elevated liver enzymes    GERD (gastroesophageal reflux disease)    Hyperlipidemia    Hypertension    Nonspecific elevation of levels of transaminase or lactic acid dehydrogenase (LDH)    Palpitation    Primary biliary cirrhosis (HCC)     Past Surgical History: Past Surgical History:  Procedure Laterality Date   correction of nasal surgery  35 years ago    Left ovarian cyst removal  1974   ROTATOR CUFF REPAIR Left 08/2016   TONSILLECTOMY  1967     Family History: Family History  Problem Relation Age of Onset   Dementia Mother    Heart failure Father    Hypertension Father    Cancer Brother 49       kidney   Cancer Maternal Aunt        breast   Dementia Maternal Aunt     Social History: Social History   Tobacco Use  Smoking Status Never  Smokeless Tobacco Never   Social History   Substance and Sexual Activity  Alcohol Use No   Social  History   Substance and Sexual Activity  Drug Use No    Allergies: Allergies  Allergen Reactions   Fish Allergy Nausea And Vomiting    Cod    Fluoxetine Other (See Comments)    Dull headaache   Other Cough    Rag weed Sneezing    Oxycodone Nausea And Vomiting   Sulfonamide Derivatives Hives    Medications: Current Outpatient Medications  Medication Sig Dispense Refill    amLODipine (NORVASC) 5 MG tablet Take 1 tablet (5 mg total) by mouth daily. 30 tablet 5   calcium-vitamin D (OSCAL-500) 500-400 MG-UNIT tablet Take 1 tablet by mouth 2 (two) times daily. 60 tablet 5   Chlorophyll 50 MG CAPS Take 3 capsules by mouth daily.     denosumab (PROLIA) 60 MG/ML SOSY injection Inject 60 mg into the skin every 6 (six) months. 1 mL 1   Digestive Enzymes (PAPAYA ENZYME) CHEW Chew by mouth as needed.     Fish Oil-Krill Oil (KRILL OIL PLUS) CAPS Take 1 capsule by mouth daily.     Green Tea, Camillia sinensis, (GREEN TEA PO) Take by mouth. Patient states that this is a decaf tea and she drinks it 1-2 times daily     MACA ROOT PO Take 523 mg by mouth 2 (two) times daily.      Milk Thistle 200 MG CAPS Take 1 capsule by mouth daily at 6 (six) AM.     Multiple Vitamin (MULTIVITAMIN) tablet Take 1 tablet by mouth daily. With iron     NATTOKINASE PO Take 50 mg by mouth daily.     olmesartan (BENICAR) 20 MG tablet Take 1 tablet (20 mg total) by mouth daily. 30 tablet 3   ursodiol (ACTIGALL) 300 MG capsule Take 1 capsule (300 mg total) by mouth 3 (three) times daily. 180 capsule 6   vitamin B-12 (CYANOCOBALAMIN) 1000 MCG tablet Take 1,000 mcg by mouth daily.     vitamin C (ASCORBIC ACID) 500 MG tablet Take 500 mg by mouth daily.      IRON CR PO Take 80 mg by mouth daily. (Patient not taking: Reported on 09/26/2021)     Methylsulfonylmethane (MSM) 500 MG CAPS Take 1 capsule by mouth daily.     No current facility-administered medications for this visit.    Review of Systems: GENERAL: negative for malaise, night sweats HEENT: No changes in hearing or vision, no nose bleeds or other nasal problems. NECK: Negative for lumps, goiter, pain and significant neck swelling RESPIRATORY: Negative for cough, wheezing CARDIOVASCULAR: Negative for chest pain, leg swelling, palpitations, orthopnea GI: SEE HPI MUSCULOSKELETAL: Negative for joint pain or swelling, back pain, and muscle  pain. SKIN: Negative for lesions, rash PSYCH: Negative for sleep disturbance, mood disorder and recent psychosocial stressors. HEMATOLOGY Negative for prolonged bleeding, bruising easily, and swollen nodes. ENDOCRINE: Negative for cold or heat intolerance, polyuria, polydipsia and goiter. NEURO: negative for tremor, gait imbalance, syncope and seizures. The remainder of the review of systems is noncontributory.   Physical Exam: BP 140/67 (BP Location: Left Arm, Patient Position: Sitting, Cuff Size: Small)   Pulse 85   Temp 98 F (36.7 C) (Oral)   Ht '5\' 5"'$  (1.651 m)   Wt 148 lb 11.2 oz (67.4 kg)   BMI 24.74 kg/m  GENERAL: The patient is AO x3, in no acute distress. HEENT: Head is normocephalic and atraumatic. EOMI are intact. Mouth is well hydrated and without lesions. NECK: Supple. No masses LUNGS: Clear to auscultation. No presence  of rhonchi/wheezing/rales. Adequate chest expansion HEART: RRR, normal s1 and s2. ABDOMEN: Soft, nontender, no guarding, no peritoneal signs, and nondistended. BS +. No masses. EXTREMITIES: Without any cyanosis, clubbing, rash, lesions or edema. NEUROLOGIC: AOx3, no focal motor deficit. SKIN: no jaundice, no rashes  Imaging/Labs: as above  I personally reviewed and interpreted the available labs, imaging and endoscopic files.  Impression and Plan: Yolanda Foley is a 81 y.o. female with PMH PBC cirrhosis, HTN, HLD, GERD, Cholelithiasis, chronic anemia, depression and chronic pain, who presents for follow up of PBC cirrhosis.  The patient has not presented any decompensating events.  Based on some of her blood work-up and imaging findings, she may have early cirrhosis at this stage.  She has a low MELD score which is reassuring.  We had a thorough discussion with the patient regarding the prognosis and management of her current liver disease which she understood.  We will check an INR to update her MELD score and we will check an AFP and ultrasound for [place]  screening.  She should continue taking ursodiol at the current dose.  Finally, she is due for esophageal variceal screening, we will reschedule her EGD.  - Schedule liver US - Check INR and AFP - Schedule EGD - Continue ursodiol 300 mg TID - Reduce salt intake to <2 g per day - Can take Tylenol max of 2 g per day (650 mg q8h) for pain - Avoid NSAIDs for pain - Avoid eating raw oysters/shellfish - Protein shake (Ensure or Boost) every night before going to sleep  All questions were answered.      Harvel Quale, MD Gastroenterology and Hepatology [facility]

## 2021-09-27 ENCOUNTER — Other Ambulatory Visit (INDEPENDENT_AMBULATORY_CARE_PROVIDER_SITE_OTHER): Payer: Self-pay

## 2021-09-30 ENCOUNTER — Encounter (INDEPENDENT_AMBULATORY_CARE_PROVIDER_SITE_OTHER): Payer: Self-pay

## 2021-10-02 ENCOUNTER — Ambulatory Visit (INDEPENDENT_AMBULATORY_CARE_PROVIDER_SITE_OTHER): Payer: Medicare Other | Admitting: Family Medicine

## 2021-10-02 ENCOUNTER — Encounter: Payer: Self-pay | Admitting: Family Medicine

## 2021-10-02 VITALS — BP 150/80 | HR 89 | Resp 16 | Ht 65.0 in | Wt 147.1 lb

## 2021-10-02 DIAGNOSIS — F4329 Adjustment disorder with other symptoms: Secondary | ICD-10-CM | POA: Diagnosis not present

## 2021-10-02 DIAGNOSIS — I10 Essential (primary) hypertension: Secondary | ICD-10-CM | POA: Diagnosis not present

## 2021-10-02 DIAGNOSIS — K7469 Other cirrhosis of liver: Secondary | ICD-10-CM | POA: Diagnosis not present

## 2021-10-02 DIAGNOSIS — K743 Primary biliary cirrhosis: Secondary | ICD-10-CM | POA: Diagnosis not present

## 2021-10-02 NOTE — Patient Instructions (Signed)
F/U in 8 weeks re evaluate blood pressure, call if you need me sooner  PLEASE commit to once daily olmesartan 20 mg AND once daily amlodipine 5 mg for your blood pressure to be controlled, it is still high because you have not been taking the amlodipine , you NEED to take that every day also  Continue to work on changes with aging and how you handle those, KNOWING that your adult children love you most and have your best interest at heart  Work on communication espescially with those you love the most  GREAT that you are keeping all referrals and appointments for your health1  Thanks for choosing [facility], we consider it a privelige to serve you.

## 2021-10-03 DIAGNOSIS — R7989 Other specified abnormal findings of blood chemistry: Secondary | ICD-10-CM | POA: Diagnosis not present

## 2021-10-03 LAB — PROTIME-INR
INR: 1
Prothrombin Time: 10.3 s (ref 9.0–11.5)

## 2021-10-03 LAB — AFP TUMOR MARKER: AFP-Tumor Marker: 8.2 ng/mL — ABNORMAL HIGH

## 2021-10-04 DIAGNOSIS — R768 Other specified abnormal immunological findings in serum: Secondary | ICD-10-CM | POA: Diagnosis not present

## 2021-10-04 DIAGNOSIS — K7460 Unspecified cirrhosis of liver: Secondary | ICD-10-CM | POA: Diagnosis not present

## 2021-10-04 DIAGNOSIS — D1803 Hemangioma of intra-abdominal structures: Secondary | ICD-10-CM | POA: Diagnosis not present

## 2021-10-04 DIAGNOSIS — R932 Abnormal findings on diagnostic imaging of liver and biliary tract: Secondary | ICD-10-CM | POA: Diagnosis not present

## 2021-10-04 DIAGNOSIS — R9389 Abnormal findings on diagnostic imaging of other specified body structures: Secondary | ICD-10-CM | POA: Diagnosis not present

## 2021-10-04 DIAGNOSIS — E871 Hypo-osmolality and hyponatremia: Secondary | ICD-10-CM | POA: Diagnosis not present

## 2021-10-04 DIAGNOSIS — R7989 Other specified abnormal findings of blood chemistry: Secondary | ICD-10-CM | POA: Diagnosis not present

## 2021-10-04 DIAGNOSIS — D649 Anemia, unspecified: Secondary | ICD-10-CM | POA: Diagnosis not present

## 2021-10-04 DIAGNOSIS — I10 Essential (primary) hypertension: Secondary | ICD-10-CM | POA: Diagnosis not present

## 2021-10-04 DIAGNOSIS — K8020 Calculus of gallbladder without cholecystitis without obstruction: Secondary | ICD-10-CM | POA: Diagnosis not present

## 2021-10-07 ENCOUNTER — Other Ambulatory Visit (HOSPITAL_COMMUNITY): Payer: Medicare Other

## 2021-10-10 DIAGNOSIS — R768 Other specified abnormal immunological findings in serum: Secondary | ICD-10-CM | POA: Diagnosis not present

## 2021-10-10 DIAGNOSIS — D472 Monoclonal gammopathy: Secondary | ICD-10-CM | POA: Diagnosis not present

## 2021-10-10 DIAGNOSIS — R7989 Other specified abnormal findings of blood chemistry: Secondary | ICD-10-CM | POA: Diagnosis not present

## 2021-10-10 DIAGNOSIS — K743 Primary biliary cirrhosis: Secondary | ICD-10-CM | POA: Diagnosis not present

## 2021-10-21 ENCOUNTER — Encounter: Payer: Self-pay | Admitting: Family Medicine

## 2021-10-21 ENCOUNTER — Telehealth (INDEPENDENT_AMBULATORY_CARE_PROVIDER_SITE_OTHER): Payer: Self-pay

## 2021-11-01 ENCOUNTER — Other Ambulatory Visit (HOSPITAL_COMMUNITY): Payer: Medicare Other

## 2021-11-05 ENCOUNTER — Encounter (HOSPITAL_COMMUNITY): Payer: Self-pay

## 2021-11-05 ENCOUNTER — Ambulatory Visit (HOSPITAL_COMMUNITY): Admit: 2021-11-05 | Payer: Medicare Other | Admitting: Gastroenterology

## 2021-11-05 SURGERY — ESOPHAGOGASTRODUODENOSCOPY (EGD) WITH PROPOFOL
Anesthesia: Monitor Anesthesia Care

## 2021-12-03 ENCOUNTER — Ambulatory Visit: Payer: Medicare Other | Admitting: Family Medicine

## 2022-02-14 ENCOUNTER — Encounter: Payer: Self-pay | Admitting: Family Medicine

## 2022-02-14 ENCOUNTER — Ambulatory Visit: Payer: Medicare Other | Admitting: Family Medicine

## 2022-02-14 ENCOUNTER — Ambulatory Visit (INDEPENDENT_AMBULATORY_CARE_PROVIDER_SITE_OTHER): Payer: Medicare Other | Admitting: Family Medicine

## 2022-02-14 VITALS — BP 170/90 | HR 77 | Ht 65.0 in | Wt 146.1 lb

## 2022-02-14 DIAGNOSIS — E7849 Other hyperlipidemia: Secondary | ICD-10-CM | POA: Diagnosis not present

## 2022-02-14 DIAGNOSIS — I10 Essential (primary) hypertension: Secondary | ICD-10-CM

## 2022-02-14 DIAGNOSIS — K743 Primary biliary cirrhosis: Secondary | ICD-10-CM

## 2022-02-14 DIAGNOSIS — E663 Overweight: Secondary | ICD-10-CM | POA: Diagnosis not present

## 2022-02-14 DIAGNOSIS — Z23 Encounter for immunization: Secondary | ICD-10-CM | POA: Diagnosis not present

## 2022-02-14 DIAGNOSIS — F4329 Adjustment disorder with other symptoms: Secondary | ICD-10-CM | POA: Diagnosis not present

## 2022-02-14 MED ORDER — OLMESARTAN MEDOXOMIL 20 MG PO TABS: ORAL_TABLET | ORAL | 3 refills | Status: DC

## 2022-02-14 NOTE — Assessment & Plan Note (Signed)
Hyperlipidemia:Low fat diet discussed and encouraged.   Lipid Panel  Lab Results  Component Value Date   CHOL 180 08/01/2021   HDL 64 08/01/2021   LDLCALC 104 (H) 08/01/2021   TRIG 64 08/01/2021   CHOLHDL 2.8 08/01/2021     Updated lab needed at/ before next visit.

## 2022-02-14 NOTE — Assessment & Plan Note (Signed)
Continues to report difficulty caring for her spouse , but not very willing / interested in getting assistance from her children

## 2022-02-14 NOTE — Progress Notes (Signed)
Yolanda Foley     MRN: [id number]      DOB: 1940/05/18   HPI [name] is here for follow up and re-evaluation of chronic medical conditions, medication management and review of any available recent lab and radiology data.      Questions or concerns regarding consultations or procedures which the PT has had in the interim are  addressed. The PT denies any adverse reactions to current medications since the last visit. Not taking amlodipine, does not trust it!  ROS Denies recent fever or chills. Denies sinus pressure, nasal congestion, ear pain or sore throat. Denies chest congestion, productive cough or wheezing. Denies chest pains, palpitations and leg swelling Denies abdominal pain, nausea, vomiting,diarrhea or constipation.   Denies dysuria, frequency, hesitancy or incontinence. Denies uncontrolled  joint pain, swelling and limitation in mobility. Denies headaches, seizures, numbness, or tingling. Denies depression, uncontrolled  anxiety or insomnia. Denies skin break down or rash.   PE  BP (!) 170/90   Pulse 77   Ht '5\' 5"'$  (1.651 m)   Wt 146 lb 1.9 oz (66.3 kg)   SpO2 97%   BMI 24.32 kg/m   Patient alert and oriented and in no cardiopulmonary distress.  HEENT: No facial asymmetry, EOMI,     Neck supple .  Chest: Clear to auscultation bilaterally.  CVS: S1, S2 no murmurs, no S3.Regular rate.  ABD: Soft non tender.   Ext: No edema  MS: Adequate ROM spine, shoulders, hips and knees.  Skin: Intact, no ulcerations or rash noted.  Psych: Good eye contact, normal affect. Memory intact not anxious or depressed appearing.  CNS: CN 2-12 intact, power,  normal throughout.no focal deficits noted.   Assessment & Plan  Essential hypertension Uncontrolled , inc olmesartan to 20 mg twice daily DASH diet and commitment to daily physical activity for a minimum of 30 minutes discussed and encouraged, as a part of hypertension management. The importance of attaining a healthy  weight is also discussed.     02/14/2022    4:39 PM 02/14/2022    4:26 PM 02/14/2022    4:23 PM 10/02/2021    1:58 PM 10/02/2021    1:26 PM 09/26/2021    3:53 PM 08/13/2021    3:41 PM  BP/Weight  Systolic BP 557 322 025 427 062 376 283  Diastolic BP 90 80 83 80 77 67 87  Wt. (Lbs)   146.12  147.12 148.7   BMI   24.32 kg/m2  24.48 kg/m2 24.74 kg/m2        Hyperlipemia Hyperlipidemia:Low fat diet discussed and encouraged.   Lipid Panel  Lab Results  Component Value Date   CHOL 180 08/01/2021   HDL 64 08/01/2021   LDLCALC 104 (H) 08/01/2021   TRIG 64 08/01/2021   CHOLHDL 2.8 08/01/2021     Updated lab needed at/ before next visit.   Overweight (BMI 25.0-29.9)  Patient re-educated about  the importance of commitment to a  minimum of 150 minutes of exercise per week as able.  The importance of healthy food choices with portion control discussed, as well as eating regularly and within a 12 hour window most days. The need to choose "clean , green" food 50 to 75% of the time is discussed, as well as to make water the primary drink and set a goal of 64 ounces water daily.       02/14/2022    4:23 PM 10/02/2021    1:26 PM 09/26/2021  3:53 PM  Weight /BMI  Weight 146 lb 1.9 oz 147 lb 1.9 oz 148 lb 11.2 oz  Height '5\' 5"'$  (1.651 m) '5\' 5"'$  (1.651 m) '5\' 5"'$  (1.651 m)  BMI 24.32 kg/m2 24.48 kg/m2 24.74 kg/m2      Primary biliary cholangitis (HCC) Followed by GI  Stress and adjustment reaction Continues to report difficulty caring for her spouse , but not very willing / interested in getting assistance from her children

## 2022-02-14 NOTE — Assessment & Plan Note (Signed)
Followed by GI

## 2022-02-14 NOTE — Patient Instructions (Addendum)
Follow-up last week in November call if you need me sooner.  Blood pressure is still high.  Start olmesartan 20 mg 1 tablet at 10 AM and 1 tablet at 10 PM.  Fasting Chem-7 and EGFR 5 to 7 days before your next visit. ([facility])  Flu vaccine in office today  Thanks for choosing [facility], we consider it a privelige to serve you.

## 2022-02-14 NOTE — Assessment & Plan Note (Signed)
Uncontrolled , inc olmesartan to 20 mg twice daily DASH diet and commitment to daily physical activity for a minimum of 30 minutes discussed and encouraged, as a part of hypertension management. The importance of attaining a healthy weight is also discussed.     02/14/2022    4:39 PM 02/14/2022    4:26 PM 02/14/2022    4:23 PM 10/02/2021    1:58 PM 10/02/2021    1:26 PM 09/26/2021    3:53 PM 08/13/2021    3:41 PM  BP/Weight  Systolic BP 728 206 015 615 379 432 761  Diastolic BP 90 80 83 80 77 67 87  Wt. (Lbs)   146.12  147.12 148.7   BMI   24.32 kg/m2  24.48 kg/m2 24.74 kg/m2

## 2022-02-14 NOTE — Assessment & Plan Note (Signed)
  Patient re-educated about  the importance of commitment to a  minimum of 150 minutes of exercise per week as able.  The importance of healthy food choices with portion control discussed, as well as eating regularly and within a 12 hour window most days. The need to choose "clean , green" food 50 to 75% of the time is discussed, as well as to make water the primary drink and set a goal of 64 ounces water daily.       02/14/2022    4:23 PM 10/02/2021    1:26 PM 09/26/2021    3:53 PM  Weight /BMI  Weight 146 lb 1.9 oz 147 lb 1.9 oz 148 lb 11.2 oz  Height '5\' 5"'$  (1.651 m) '5\' 5"'$  (3.810 m) '5\' 5"'$  (1.651 m)  BMI 24.32 kg/m2 24.48 kg/m2 24.74 kg/m2

## 2022-03-08 ENCOUNTER — Encounter (INDEPENDENT_AMBULATORY_CARE_PROVIDER_SITE_OTHER): Payer: Self-pay | Admitting: Gastroenterology

## 2022-03-14 ENCOUNTER — Encounter (INDEPENDENT_AMBULATORY_CARE_PROVIDER_SITE_OTHER): Payer: Self-pay | Admitting: *Deleted

## 2022-03-16 ENCOUNTER — Telehealth: Payer: Self-pay | Admitting: Family Medicine

## 2022-03-16 NOTE — Telephone Encounter (Signed)
Paperwork and message sent re changing ARB to one on formulary

## 2022-03-26 ENCOUNTER — Ambulatory Visit: Payer: Medicare Other | Admitting: Family Medicine

## 2022-03-31 ENCOUNTER — Encounter (INDEPENDENT_AMBULATORY_CARE_PROVIDER_SITE_OTHER): Payer: Self-pay | Admitting: Gastroenterology

## 2022-03-31 ENCOUNTER — Ambulatory Visit (INDEPENDENT_AMBULATORY_CARE_PROVIDER_SITE_OTHER): Payer: Medicare Other | Admitting: Gastroenterology

## 2022-03-31 VITALS — BP 166/83 | HR 66 | Temp 97.3°F | Ht 65.0 in | Wt 149.7 lb

## 2022-03-31 DIAGNOSIS — K743 Primary biliary cirrhosis: Secondary | ICD-10-CM

## 2022-03-31 DIAGNOSIS — K7469 Other cirrhosis of liver: Secondary | ICD-10-CM | POA: Diagnosis not present

## 2022-03-31 DIAGNOSIS — I10 Essential (primary) hypertension: Secondary | ICD-10-CM | POA: Diagnosis not present

## 2022-03-31 NOTE — Patient Instructions (Addendum)
-   Schedule EGD - Check CBC, MELD labs and AFP - Continue ursodiol 300 mg three times a day - Schedule liver US - Reduce salt intake to <2 g per day - Can take Tylenol max of 2 g per day (650 mg q8h) for pain - Avoid NSAIDs for pain - Avoid eating raw oysters/shellfish - Protein shake (Ensure or Boost) every night before going to sleep  The patient was found to have elevated blood pressure when vital signs were checked in the office. The blood pressure was rechecked by the nursing staff and it was found be persistently elevated >140/90 mmHg. I personally advised to the patient to follow up closely with his PCP for hypertension control.

## 2022-03-31 NOTE — Progress Notes (Signed)
Maylon Peppers, M.D. Gastroenterology & Hepatology [facility] [address] [place], [place] [address]  Primary Care Physician: Fayrene Helper, MD [address], [place] [address]  I will communicate my assessment and recommendations to the referring MD via EMR.  Problems: PBC cirrhosis   History of Present Illness: Yolanda Foley is a 81 y.o. female with PMH PBC cirrhosis, HTN, HLD, GERD, Cholelithiasis, chronic anemia, depression and chronic pain, who presents for follow up of PBC cirrhosis.  The patient was last seen on 09/26/2021. At that time, the patient was scheudled for Egd but she canceled this procedure. Did not proceed with scheduled [place].  Patient reports she recently had a episode of chills on Thursday and it resolved after a coupole of days. She has been having some headaches but they have been very mild.   States when she gets nervous or anxious, she gets some abdominal pain which subsides when she calms down.  Liver elastography was performed on 12/26/2020 which showed a low kPa (4.2) but with presence of heterogeneous texture which raised the concern for mild/early cirrhosis.   Tries to take ursodiol 300 mg TID but sometimes forgets taking one of the doses.  The patient denies having any nausea, vomiting, fever, chills, hematochezia, melena, hematemesis, abdominal distention, diarrhea, jaundice, pruritus or weight loss.  Cirrhosis related questions: Hematemesis/coffee ground emesis: No History of variceal bleeding: No Abdominal pain: No Abdominal distention/worsening ascitesNo Fever/chills: No Episodes of confusion/disorientation: No Taking diuretics?: No Date of last EGD: Never Prior history of banding?: No Prior episodes of SBP: No Last time liver imaging was performed:09/2021 - no liver masses, cholelithiasis, spleen nodule, liver hemangioma. MELD score: 7 - 09/2021   Last Colonoscopy:(07/16/09)  internal and external hemorrhoids  Past Medical History: Past Medical History:  Diagnosis Date   Cholelithiasis    Chronic pain    Depression    Elevated liver enzymes    GERD (gastroesophageal reflux disease)    Hyperlipidemia    Hypertension    Nonspecific elevation of levels of transaminase or lactic acid dehydrogenase (LDH)    Palpitation    Primary biliary cirrhosis (HCC)     Past Surgical History: Past Surgical History:  Procedure Laterality Date   correction of nasal surgery  35 years ago    Left ovarian cyst removal  1974   ROTATOR CUFF REPAIR Left 08/2016   TONSILLECTOMY  1967     Family History: Family History  Problem Relation Age of Onset   Dementia Mother    Heart failure Father    Hypertension Father    Cancer Brother 37       kidney   Cancer Maternal Aunt        breast   Dementia Maternal Aunt     Social History: Social History   Tobacco Use  Smoking Status Never  Smokeless Tobacco Never   Social History   Substance and Sexual Activity  Alcohol Use No   Social History   Substance and Sexual Activity  Drug Use No    Allergies: Allergies  Allergen Reactions   Fish Allergy Nausea And Vomiting    Cod    Fluoxetine Other (See Comments)    Dull headaache   Other Cough    Rag weed Sneezing    Oxycodone Nausea And Vomiting   Sulfonamide Derivatives Hives    Medications: Current Outpatient Medications  Medication Sig Dispense Refill   ASHWAGANDHA PO Take by mouth daily  as needed. For stress     Chlorophyll 50 MG CAPS Take 3 capsules by mouth daily.     MACA ROOT PO Take 523 mg by mouth 2 (two) times daily.      Methylsulfonylmethane (MSM) 500 MG CAPS Take 1 capsule by mouth daily.     milk thistle 175 MG tablet Take 175 mg by mouth as needed. When she is out of Urso     olmesartan (BENICAR) 20 MG tablet Take one tablet by mouth two times daily at 10 am and 10 pm 60 tablet 3   OVER THE COUNTER MEDICATION Vit E prn when she does not  feel well     ursodiol (ACTIGALL) 300 MG capsule Take 1 capsule (300 mg total) by mouth 3 (three) times daily. 180 capsule 6   vitamin C (ASCORBIC ACID) 500 MG tablet Take 500 mg by mouth daily.      No current facility-administered medications for this visit.    Review of Systems: GENERAL: negative for malaise, night sweats HEENT: No changes in hearing or vision, no nose bleeds or other nasal problems. NECK: Negative for lumps, goiter, pain and significant neck swelling RESPIRATORY: Negative for cough, wheezing CARDIOVASCULAR: Negative for chest pain, leg swelling, palpitations, orthopnea GI: SEE HPI MUSCULOSKELETAL: Negative for joint pain or swelling, back pain, and muscle pain. SKIN: Negative for lesions, rash PSYCH: Negative for sleep disturbance, mood disorder and recent psychosocial stressors. HEMATOLOGY Negative for prolonged bleeding, bruising easily, and swollen nodes. ENDOCRINE: Negative for cold or heat intolerance, polyuria, polydipsia and goiter. NEURO: negative for tremor, gait imbalance, syncope and seizures. The remainder of the review of systems is noncontributory.   Physical Exam: BP (!) 166/83 (BP Location: Right Arm, Patient Position: Sitting, Cuff Size: Small)   Pulse 66   Temp (!) 97.3 F (36.3 C) (Temporal)   Ht '5\' 5"'$  (1.651 m)   Wt 149 lb 11.2 oz (67.9 kg)   BMI 24.91 kg/m  GENERAL: The patient is AO x3, in no acute distress. HEENT: Head is normocephalic and atraumatic. EOMI are intact. Mouth is well hydrated and without lesions. NECK: Supple. No masses LUNGS: Clear to auscultation. No presence of rhonchi/wheezing/rales. Adequate chest expansion HEART: RRR, normal s1 and s2. ABDOMEN: Soft, nontender, no guarding, no peritoneal signs, and nondistended. BS +. No masses. EXTREMITIES: Without any cyanosis, clubbing, rash, lesions or edema. NEUROLOGIC: AOx3, no focal motor deficit. SKIN: no jaundice, no rashes  Imaging/Labs: as above  I personally  reviewed and interpreted the available labs, imaging and endoscopic files.  Impression and Plan: Yolanda Foley is a 81 y.o. female with PMH PBC cirrhosis, HTN, HLD, GERD, Cholelithiasis, chronic anemia, depression and chronic pain, who presents for follow up of PBC cirrhosis.  The patient has presented very early changes of cirrhosis on imaging although she has not presented any overt portal hypertension (it is questionable if thrombocytopenia is related to this).  She has had a low MELD score and no decompensating events.  Will update her MELD labs today.  I had a thorough discussion with the patient regarding the importance of proceeding with EGD for variceal screening which she is agreeable to proceed with.  Will also update HCC screening with ultrasound and AFP.  She will need to continue for now on oral 300 mg 3 times a day.  The patient was found to have elevated blood pressure when vital signs were checked in the office. The blood pressure was rechecked by the nursing staff and it  was found be persistently elevated >140/90 mmHg. I personally advised to the patient to follow up closely with his PCP for hypertension control.  - Schedule EGD - Check CBC, MELD labs and AFP - Continue ursodiol 300 mg three times a day - Schedule liver US - Reduce salt intake to <2 g per day - Can take Tylenol max of 2 g per day (650 mg q8h) for pain - Avoid NSAIDs for pain - Avoid eating raw oysters/shellfish - Protein shake (Ensure or Boost) every night before going to sleep  All questions were answered.      Maylon Peppers, MD Gastroenterology and Hepatology [facility]

## 2022-04-01 ENCOUNTER — Encounter (INDEPENDENT_AMBULATORY_CARE_PROVIDER_SITE_OTHER): Payer: Self-pay | Admitting: *Deleted

## 2022-04-01 ENCOUNTER — Telehealth (INDEPENDENT_AMBULATORY_CARE_PROVIDER_SITE_OTHER): Payer: Self-pay | Admitting: *Deleted

## 2022-04-01 LAB — BASIC METABOLIC PANEL WITH GFR
BUN: 11 mg/dL (ref 7–25)
CO2: 28 mmol/L (ref 20–32)
Calcium: 9.1 mg/dL (ref 8.6–10.4)
Chloride: 104 mmol/L (ref 98–110)
Creat: 0.67 mg/dL (ref 0.60–0.95)
Glucose, Bld: 83 mg/dL (ref 65–99)
Potassium: 3.9 mmol/L (ref 3.5–5.3)
Sodium: 139 mmol/L (ref 135–146)
eGFR: 87 mL/min/{1.73_m2} (ref >=60)

## 2022-04-01 NOTE — Telephone Encounter (Signed)
LMOVM to call back to schedule EGD with Dr. Jenetta Downer, room 3

## 2022-04-07 ENCOUNTER — Telehealth (INDEPENDENT_AMBULATORY_CARE_PROVIDER_SITE_OTHER): Payer: Self-pay | Admitting: *Deleted

## 2022-04-07 NOTE — Telephone Encounter (Signed)
Pt called in. She was scheduled for [place] tomorrow but needed to cancel. She wants to done in [place]. She gave fax# to fax order to [phone]. Order has been faxed. Aware [facility] will call her to schedule this.

## 2022-04-07 NOTE — Telephone Encounter (Signed)
Pt stated spouse is sick right now and will call back to schedule

## 2022-04-08 ENCOUNTER — Ambulatory Visit (HOSPITAL_COMMUNITY): Payer: Medicare Other

## 2022-04-10 DIAGNOSIS — R928 Other abnormal and inconclusive findings on diagnostic imaging of breast: Secondary | ICD-10-CM | POA: Diagnosis not present

## 2022-04-10 DIAGNOSIS — K7469 Other cirrhosis of liver: Secondary | ICD-10-CM | POA: Diagnosis not present

## 2022-04-10 DIAGNOSIS — K8020 Calculus of gallbladder without cholecystitis without obstruction: Secondary | ICD-10-CM | POA: Diagnosis not present

## 2022-04-10 DIAGNOSIS — K7460 Unspecified cirrhosis of liver: Secondary | ICD-10-CM | POA: Diagnosis not present

## 2022-04-10 DIAGNOSIS — K743 Primary biliary cirrhosis: Secondary | ICD-10-CM | POA: Diagnosis not present

## 2022-04-10 DIAGNOSIS — R922 Inconclusive mammogram: Secondary | ICD-10-CM | POA: Diagnosis not present

## 2022-04-10 DIAGNOSIS — N6314 Unspecified lump in the right breast, lower inner quadrant: Secondary | ICD-10-CM | POA: Diagnosis not present

## 2022-04-10 DIAGNOSIS — N6312 Unspecified lump in the right breast, upper inner quadrant: Secondary | ICD-10-CM | POA: Diagnosis not present

## 2022-04-10 LAB — HM MAMMOGRAPHY: HM Mammogram: NORMAL (ref 0–4)

## 2022-04-10 NOTE — Telephone Encounter (Signed)
Called pt, LMTCB to schedule EGD with Dr. Jenetta Downer, asa 3 dx cirrhosis

## 2022-04-13 ENCOUNTER — Telehealth: Payer: Self-pay | Admitting: Gastroenterology

## 2022-04-13 NOTE — Telephone Encounter (Signed)
Hi ,  Can you please call the patient and tell the patient the [place] did not show any masses in the liver? Will need to repeat in 6 months.  Thanks,  Maylon Peppers, MD Gastroenterology and Hepatology [facility]

## 2022-04-14 NOTE — Telephone Encounter (Signed)
I called and left a message asked that the patient please return call.  

## 2022-04-15 NOTE — Telephone Encounter (Signed)
  Ann, would you please place repeat in recalls for six months from now? Thanks  Patient made aware of the results and aware she will need to repeat in 6 months.

## 2022-04-15 NOTE — Telephone Encounter (Signed)
6 mth US noted in recall 

## 2022-05-02 ENCOUNTER — Telehealth: Payer: Self-pay | Admitting: Family Medicine

## 2022-05-02 NOTE — Telephone Encounter (Signed)
Pls let her know I reviewed her mammo report , she needs a rept diag right breast  mammo and ultrasound at [facility] 6/14 or after, pls order so it is scheduled 9 report is in your area) Also she needs an appt in office re uncontrolled hTN, pls encourage and get her to sched one in the next 4 weeks pls tx

## 2022-05-09 ENCOUNTER — Other Ambulatory Visit: Payer: Self-pay

## 2022-05-09 DIAGNOSIS — Z1231 Encounter for screening mammogram for malignant neoplasm of breast: Secondary | ICD-10-CM

## 2022-05-09 DIAGNOSIS — N6459 Other signs and symptoms in breast: Secondary | ICD-10-CM

## 2022-05-09 NOTE — Telephone Encounter (Signed)
lmtrc

## 2022-05-28 ENCOUNTER — Ambulatory Visit (INDEPENDENT_AMBULATORY_CARE_PROVIDER_SITE_OTHER): Payer: Medicare Other | Admitting: Family Medicine

## 2022-05-28 ENCOUNTER — Encounter: Payer: Self-pay | Admitting: Family Medicine

## 2022-05-28 VITALS — BP 152/80 | HR 81 | Ht 65.0 in | Wt 149.0 lb

## 2022-05-28 DIAGNOSIS — L659 Nonscarring hair loss, unspecified: Secondary | ICD-10-CM | POA: Diagnosis not present

## 2022-05-28 DIAGNOSIS — R519 Headache, unspecified: Secondary | ICD-10-CM

## 2022-05-28 DIAGNOSIS — K743 Primary biliary cirrhosis: Secondary | ICD-10-CM

## 2022-05-28 DIAGNOSIS — G8929 Other chronic pain: Secondary | ICD-10-CM | POA: Diagnosis not present

## 2022-05-28 DIAGNOSIS — I10 Essential (primary) hypertension: Secondary | ICD-10-CM

## 2022-05-28 MED ORDER — CARVEDILOL 3.125 MG PO TABS: 3.1250 mg | ORAL_TABLET | Freq: Two times a day (BID) | ORAL | 3 refills | Status: DC

## 2022-05-28 NOTE — Patient Instructions (Addendum)
F/U  in 10 to 8 weeks, call if you need me before, re evaluate blood pressure  New additional medication for blood pressure which has  improved slightly is carvedilol 3.125 mg 1 tablet twice daily.  Please take both of your blood pressure pills together 2 times a day 12 hours  apart for example 10 AM and 10 PM.  It is important to keep them on a regular schedule for the them to be most effective.  As far as hair loss is concerned blood pressure medication that you are on does not contribute to this.  Often many people start having thinning of the hair and hair loss with age.  If by  any chance you still use chemicals in your hair I recommend that you stop as these also damage  hair and increase hair loss.  Headaches are often caused by tension stress anxiety and worry.  Also at times people with elevated blood pressure complain of headache .  I do encourage you  to continue to work on accepting things that you cannot change and learning how to  to reduce your anxiety level.  If you start having increased frequency or severity of headache or if you develop any weakness or numbness or difficulty with speech or swallowing you do need to go to the emergency room for evaluation. Thanks for choosing [facility], we consider it a privelige to serve you.

## 2022-05-31 ENCOUNTER — Encounter: Payer: Self-pay | Admitting: Family Medicine

## 2022-05-31 DIAGNOSIS — L659 Nonscarring hair loss, unspecified: Secondary | ICD-10-CM | POA: Insufficient documentation

## 2022-05-31 DIAGNOSIS — R519 Headache, unspecified: Secondary | ICD-10-CM | POA: Insufficient documentation

## 2022-05-31 NOTE — Assessment & Plan Note (Signed)
Triggered by stress , anxiety and poor sleep, always worrying about spouse

## 2022-05-31 NOTE — Assessment & Plan Note (Signed)
Continue ursodiol and f/u regularly with GI

## 2022-05-31 NOTE — Progress Notes (Signed)
   Yolanda Foley     MRN: [id number]      DOB: 28-Oct-1940   HPI Yolanda Foley is here for follow up and re-evaluation of chronic medical conditions, medication management and review of any available recent lab and radiology data.  Preventive health is updated, specifically  Cancer screening and Immunization.  C/o stress with caring for her spouse with dementia, an ongoing concern, would benefit from therapy states she has no time  C/o hair loss wonders if medication is contributing C/o frontal pressure and tension, feels stress has a lot to do with t, always worrying about her spouse Has BP cuff from home which does correspond with in office reading ROS Denies recent fever or chills. Denies sinus pressure, nasal congestion, ear pain or sore throat. Denies chest congestion, productive cough or wheezing. Denies chest pains, palpitations and leg swelling Denies abdominal pain, nausea, vomiting,diarrhea or constipation.   Denies dysuria, frequency, hesitancy or incontinence. Denies joint pain, swelling and limitation in mobility. Denies headaches, seizures, numbness, or tingling.  Denies skin break down or rash.   PE  BP (!) 152/80   Pulse 81   Ht '5\' 5"'$  (1.651 m)   Wt 149 lb (67.6 kg)   SpO2 93%   BMI 24.79 kg/m   Patient alert and oriented and in no cardiopulmonary distress.  HEENT: No facial asymmetry, EOMI,     Neck supple .  Chest: Clear to auscultation bilaterally.  CVS: S1, S2 no murmurs, no S3.Regular rate.  ABD: Soft non tender.   Ext: No edema  MS: Adequate ROM spine, shoulders, hips and knees.  Skin: Intact, no ulcerations or rash noted.  Psych: Good eye contact, normal affect. Memory intact not anxious or depressed appearing.  CNS: CN 2-12 intact, power,  normal throughout.no focal deficits noted.   Assessment & Plan  Essential hypertension Improved but still uncontrolled , add twice daily coreg DASH diet and commitment to daily physical activity for a  minimum of 30 minutes discussed and encouraged, as a part of hypertension management. The importance of attaining a healthy weight is also discussed.     05/28/2022    3:09 PM 05/28/2022    2:44 PM 05/28/2022    2:39 PM 03/31/2022    3:11 PM 03/31/2022    3:07 PM 02/14/2022    4:39 PM 02/14/2022    4:26 PM  BP/Weight  Systolic BP 322 025 427 062 376 283 151  Diastolic BP 80 78 78 83 70 90 80  Wt. (Lbs)   149  149.7    BMI   24.79 kg/m2  24.91 kg/m2         Primary biliary cholangitis (HCC) Continue ursodiol and f/u regularly with GI  Hair loss Associated with aging, not medication related  Headache Triggered by stress , anxiety and poor sleep, always worrying about spouse

## 2022-05-31 NOTE — Assessment & Plan Note (Signed)
Improved but still uncontrolled , add twice daily coreg DASH diet and commitment to daily physical activity for a minimum of 30 minutes discussed and encouraged, as a part of hypertension management. The importance of attaining a healthy weight is also discussed.     05/28/2022    3:09 PM 05/28/2022    2:44 PM 05/28/2022    2:39 PM 03/31/2022    3:11 PM 03/31/2022    3:07 PM 02/14/2022    4:39 PM 02/14/2022    4:26 PM  BP/Weight  Systolic BP 939 688 648 472 072 182 883  Diastolic BP 80 78 78 83 70 90 80  Wt. (Lbs)   149  149.7    BMI   24.79 kg/m2  24.91 kg/m2

## 2022-05-31 NOTE — Assessment & Plan Note (Signed)
Associated with aging, not medication related

## 2022-07-31 ENCOUNTER — Ambulatory Visit: Payer: Medicare Other | Admitting: Family Medicine

## 2022-07-31 ENCOUNTER — Encounter: Payer: Self-pay | Admitting: Family Medicine

## 2022-09-01 ENCOUNTER — Ambulatory Visit (INDEPENDENT_AMBULATORY_CARE_PROVIDER_SITE_OTHER): Payer: Medicare Other | Admitting: Gastroenterology

## 2022-09-01 ENCOUNTER — Encounter (INDEPENDENT_AMBULATORY_CARE_PROVIDER_SITE_OTHER): Payer: Self-pay | Admitting: Gastroenterology

## 2022-09-01 DIAGNOSIS — K7469 Other cirrhosis of liver: Secondary | ICD-10-CM

## 2022-09-01 DIAGNOSIS — K743 Primary biliary cirrhosis: Secondary | ICD-10-CM | POA: Diagnosis not present

## 2022-09-01 NOTE — Patient Instructions (Signed)
Please complete labs for your cirrhosis  -liver US is due in June 2024 -we will schedule EGD esophageal varice screening  -continue urso 300mg  TID  Follow up 6 months

## 2022-09-01 NOTE — Progress Notes (Signed)
Primary Care Physician:  Kerri Perches, MD  Primary GI: Levon Hedger   Patient Location: Home   Provider Location:  GI office   Reason for Visit: follow up of PBC Cirrhosis    Persons present on the virtual encounter, with roles: Arissa Fagin L. Jeanmarie Hubert, MSN, APRN, AGNP-C, Maggie Schwalbe. Adorno patient    Total time (minutes) spent on medical discussion:16 minutes  Virtual Visit via Telephone visit is conducted virtually and was requested by patient.   I connected with Daisy Lazar on 09/01/22 at  3:30 PM EDT by telephone and verified that I am speaking with the correct person using two identifiers.   I discussed the limitations, risks, security and privacy concerns of performing an evaluation and management service by telephone and the availability of in person appointments. I also discussed with the patient that there may be a patient responsible charge related to this service. The patient expressed understanding and agreed to proceed.  Chief Complaint  Patient presents with   Cirrhosis    Follow up on cirrhosis. Feels sluggish at times and states heart races at times. States carvedilol caused her heart to race. She only took doses.    History of Present Illness: Yolanda Foley is a 82 y.o. female with PMH PBC cirrhosis, HTN, HLD, GERD, Cholelithiasis, chronic anemia, depression and chronic pain   At last visit in December 2023, States when she gets nervous or anxious, she gets some abdominal pain which subsides when she calms down. Liver elastography was performed on 12/26/2020 which showed a low kPa (4.2) but with presence of heterogeneous texture which raised the concern for mild/early cirrhosis. Tries to take ursodiol 300 mg TID but sometimes forgets taking one of the doses.   Recommended scheduling EGD (last platelet count was 08/2021 71k), Check CBC, MELD labs, AFP, continue Urso 300mg  TID, Liver US  EGD was cancelled and labs were not completed. She did do RUQ [place] in December  2023 Cholelithiasis in an otherwise normal appearing gallbladder.  Coarsened increased echogenicity in the liver with a mildly nodular contour consistent with the patient's known cirrhosis. No  liver mass identified.   Present:  Patient noted she has been feeling okay until this morning when she began having some abdominal pain, she has an upcoming meeting and thinks it is due to nerves, she tends to have abdominal discomfort occasionally she anxious. She denies any swelling to her abdomen. Notes that her weight remains the same when she weighs herself. No LE edema. No episodes of confusion, jaundice or pruritus. She is taking Urso usually once a day, and is now taking milk thistle. She feels that urso makes her dizzy when she takes it in higher doses. No rectal bleeding, melena, nausea, vomiting. She inquires if she should take an iron pill because she feels fatigued.   She states that she could not complete EGD as she did not have anyone to drive her. Unsure why she did not complete labs for her cirrhosis monitoring.   Cirrhosis related questions:  Hematemesis/coffee ground emesis: No History of variceal bleeding: No Abdominal pain: No Abdominal distention/worsening ascitesNo Fever/chills: No Episodes of confusion/disorientation: No Taking diuretics?: No Date of last EGD: Never Prior history of banding?: No Prior episodes of SBP: No Last time liver imaging was performed: 03/2022 cirrhosis, no masses  MELD score: 7 - 09/2021  Last Colonoscopy:(07/16/09) internal and external hemorrhoids   Past Medical History:  Diagnosis Date   Cholelithiasis    Chronic pain  Depression    Elevated liver enzymes    GERD (gastroesophageal reflux disease)    Hyperlipidemia    Hypertension    Nonspecific elevation of levels of transaminase or lactic acid dehydrogenase (LDH)    Palpitation    Primary biliary cirrhosis (HCC)      Past Surgical History:  Procedure Laterality Date   correction of  nasal surgery  35 years ago    Left ovarian cyst removal  1974   ROTATOR CUFF REPAIR Left 08/2016   TONSILLECTOMY  1967      Current Meds  Medication Sig   Methylsulfonylmethane (MSM) 500 MG CAPS Take 1 capsule by mouth daily.   milk thistle 175 MG tablet Take 175 mg by mouth as needed. When she is out of Urso   olmesartan (BENICAR) 20 MG tablet Take one tablet by mouth two times daily at 10 am and 10 pm   ursodiol (ACTIGALL) 300 MG capsule Take 1 capsule (300 mg total) by mouth 3 (three) times daily.     Family History  Problem Relation Age of Onset   Dementia Mother    Heart failure Father    Hypertension Father    Cancer Brother 51       kidney   Cancer Maternal Aunt        breast   Dementia Maternal Aunt     Social History   Socioeconomic History   Marital status: Married    Spouse name: Not on file   Number of children: 4   Years of education: 16   Highest education level: Associate degree: academic program  Occupational History   Occupation: retired   Tobacco Use   Smoking status: Never   Smokeless tobacco: Never  Building services engineer Use: Never used  Substance and Sexual Activity   Alcohol use: No   Drug use: No   Sexual activity: Not Currently  Other Topics Concern   Not on file  Social History Narrative   Expressed to this nurse her frustration with her husband his controlling ways.   Social Determinants of Health   Financial Resource Strain: Low Risk  (02/28/2020)   Overall Financial Resource Strain (CARDIA)    Difficulty of Paying Living Expenses: Not very hard  Food Insecurity: No Food Insecurity (02/28/2020)   Hunger Vital Sign    Worried About Running Out of Food in the Last Year: Never true    Ran Out of Food in the Last Year: Never true  Transportation Needs: No Transportation Needs (02/28/2020)   PRAPARE - Administrator, Civil Service (Medical): No    Lack of Transportation (Non-Medical): No  Physical Activity: Insufficiently  Active (02/28/2020)   Exercise Vital Sign    Days of Exercise per Week: 3 days    Minutes of Exercise per Session: 30 min  Stress: No Stress Concern Present (02/28/2020)   [employer] of Occupational Health - Occupational Stress Questionnaire    Feeling of Stress : Only a little  Social Connections: Moderately Integrated (02/28/2020)   Social Connection and Isolation Panel [NHANES]    Frequency of Communication with Friends and Family: More than three times a week    Frequency of Social Gatherings with Friends and Family: Three times a week    Attends Religious Services: More than 4 times per year    Active Member of Clubs or Organizations: No    Attends Banker Meetings: Never    Marital Status: Married  Review of Systems: Gen: Denies fever, chills, anorexia. Denies fatigue, weakness, weight loss.  CV: Denies chest pain, palpitations, syncope, peripheral edema, and claudication. Resp: Denies dyspnea at rest, cough, wheezing, coughing up blood, and pleurisy. GI: see HPI Derm: Denies rash, itching, dry skin Psych: Denies depression, anxiety, memory loss, confusion. No homicidal or suicidal ideation.  Heme: Denies bruising, bleeding, and enlarged lymph nodes.  Observations/Objective: No distress. Unable to perform physical exam due to telephone encounter. No video available.   Assessment and Plan: Yolanda Foley is a 82 y.o. female who presents for follow up of PBC Cirrhosis.   Cirrhosis secondary to PBC: Appears well compensated from cirrhosis standpoint, denies ascites, episodes of confusion or jaundice. she is overdue for routine labs and did not complete previously recommended EGD for EV screening due to transportation issues. She will be due for liver imaging/hcc screening via [place] in June ( wants to do [facility] at [facility]). We will update her MELD labs, CBC and AFP and repeat liver imaging in June. I discussed importance of EGD for EV screening, as well as risks and  benefits of procedure. Patient verbalized understanding and is in agreement to proceed with EGD.   She is maintained on Urso 300mg  TID, though reports only taking maybe once per day as she feels this causes dizziness, we discussed importance of taking Urso at recommended dose and trying to take her doses in the evening to help offset symptoms which she is agreeable to try.   Patient reports some fatigue, she inquired if she should start an Iron supplement which I advised her against, we discussed that Oral iron should only be used in presence of iron deficiency, she notably had elevated ferritin in 2023 so certainly should avoid supplemental Iron. Recommend she follow up with PCP for further evaluation of her fatigue as this could be from multiple different etiologies and would require further investigation prior to treatment.    Follow Up Instructions: -CBC, AFP, MELD lab -liver US in June 2024  -EGD for EV screening ASA III -urso 300mg  TID -follow up with PCP regarding fatigue  Follow up 6 months   I discussed the assessment and treatment plan with the patient. The patient was provided an opportunity to ask questions and all were answered. The patient agreed with the plan and demonstrated an understanding of the instructions.   The patient was advised to call back or seek an in-person evaluation if the symptoms worsen or if the condition fails to improve as anticipated.  I provided 16 minutes of NON face-to-face time during this telephone encounter.  Lynnett Langlinais L. Jeanmarie Hubert, MSN, APRN, AGNP-C Adult-Gerontology Nurse Practitioner [facility]  I have reviewed the note and agree with the APP's assessment as described in this progress note  Katrinka Blazing, MD Gastroenterology and Hepatology [facility]

## 2022-09-02 ENCOUNTER — Telehealth (INDEPENDENT_AMBULATORY_CARE_PROVIDER_SITE_OTHER): Payer: Self-pay | Admitting: Gastroenterology

## 2022-09-02 NOTE — Telephone Encounter (Signed)
Pt in office yesterday and needing EGD ASA 3 scheduled. [place] ordered that pt would like to have done at [facility]. Order faxed to [facility]. Contacted pt to schedule EGD. Pt states she was driving and could I text it to her. Advised pt I could send her a message via my chart with dates. Pt verbalized understanding. Will send my chart message with dates.

## 2022-09-03 NOTE — Telephone Encounter (Signed)
Fax from [facility] 12/03/22 called left message

## 2022-09-11 DIAGNOSIS — K743 Primary biliary cirrhosis: Secondary | ICD-10-CM | POA: Diagnosis not present

## 2022-09-11 DIAGNOSIS — K8020 Calculus of gallbladder without cholecystitis without obstruction: Secondary | ICD-10-CM | POA: Diagnosis not present

## 2022-09-16 NOTE — Telephone Encounter (Signed)
Attempted to contact pt to schedule EGD. Voicemail came on. Will send letter.

## 2022-11-18 ENCOUNTER — Telehealth: Payer: Self-pay | Admitting: Family Medicine

## 2022-11-18 NOTE — Telephone Encounter (Signed)
I spoke directly with pt she is aware of need to have rept imaging of breast due since June because of abn in December , needs to be scheduled, also visits in office and wellness, message sent to front staff to follow through

## 2022-11-25 ENCOUNTER — Ambulatory Visit: Payer: Medicare Other | Admitting: Family Medicine

## 2022-11-26 DIAGNOSIS — R92321 Mammographic fibroglandular density, right breast: Secondary | ICD-10-CM | POA: Diagnosis not present

## 2022-11-26 DIAGNOSIS — R928 Other abnormal and inconclusive findings on diagnostic imaging of breast: Secondary | ICD-10-CM | POA: Diagnosis not present

## 2022-11-26 DIAGNOSIS — Z872 Personal history of diseases of the skin and subcutaneous tissue: Secondary | ICD-10-CM | POA: Diagnosis not present

## 2022-11-26 DIAGNOSIS — N6312 Unspecified lump in the right breast, upper inner quadrant: Secondary | ICD-10-CM | POA: Diagnosis not present

## 2022-12-10 ENCOUNTER — Ambulatory Visit (INDEPENDENT_AMBULATORY_CARE_PROVIDER_SITE_OTHER): Payer: Medicare Other

## 2022-12-10 VITALS — BP 120/77 | Ht 65.0 in | Wt 149.0 lb

## 2022-12-10 DIAGNOSIS — Z0001 Encounter for general adult medical examination with abnormal findings: Secondary | ICD-10-CM | POA: Diagnosis not present

## 2022-12-10 DIAGNOSIS — Z0100 Encounter for examination of eyes and vision without abnormal findings: Secondary | ICD-10-CM

## 2022-12-10 DIAGNOSIS — M818 Other osteoporosis without current pathological fracture: Secondary | ICD-10-CM | POA: Diagnosis not present

## 2022-12-10 DIAGNOSIS — Z0000 Encounter for general adult medical examination without abnormal findings: Secondary | ICD-10-CM

## 2022-12-10 DIAGNOSIS — Z780 Asymptomatic menopausal state: Secondary | ICD-10-CM | POA: Diagnosis not present

## 2022-12-10 NOTE — Progress Notes (Signed)
 Because this visit was a virtual/telehealth visit,  certain criteria was not obtained, such a blood pressure, CBG if patient is a diabetic, and timed up and go. Any medications not marked as "taking" was not mentioned during the medication reconciliation part of the visit. Any vitals not documented were not able to be obtained due to this being a telehealth visit. Vitals documented are verbally provided by the patient.   Subjective:   Yolanda Foley is a 82 y.o. female who presents for Medicare Annual (Subsequent) preventive examination.  Visit Complete: Virtual  I connected with  Yolanda Foley on 12/10/22 by a audio enabled telemedicine application and verified that I am speaking with the correct person using two identifiers.  Patient Location: Home  Provider Location: Home Office  I discussed the limitations of evaluation and management by telemedicine. The patient expressed understanding and agreed to proceed.  Patient Medicare AWV questionnaire was completed by the patient on n/a; I have confirmed that all information answered by patient is correct and no changes since this date.  Review of Systems     Cardiac Risk Factors include: advanced age (>31men, >34 women);dyslipidemia;hypertension     Objective:    Today's Vitals   12/10/22 1339 12/10/22 1343  BP: 120/77   Weight: 149 lb (67.6 kg)   Height: 5\' 5"  (1.651 m)   PainSc:  5    Body mass index is 24.79 kg/m.     12/10/2022    1:39 PM 02/28/2020   11:09 AM 02/25/2019   10:23 AM 02/18/2018    2:11 PM 06/05/2016   11:22 AM  Advanced Directives  Does Patient Have a Medical Advance Directive? No No No No No  Would patient like information on creating a medical advance directive? No - Patient declined No - Patient declined  Yes (ED - Information included in AVS) Yes (MAU/Ambulatory/Procedural Areas - Information given)    Current Medications (verified) Outpatient Encounter Medications as of 12/10/2022  Medication Sig    Methylsulfonylmethane (MSM) 500 MG CAPS Take 1 capsule by mouth daily.   milk thistle 175 MG tablet Take 175 mg by mouth as needed. When she is out of [place]   olmesartan (BENICAR) 20 MG tablet Take one tablet by mouth two times daily at 10 am and 10 pm   ursodiol (ACTIGALL) 300 MG capsule Take 1 capsule (300 mg total) by mouth 3 (three) times daily.   vitamin C (ASCORBIC ACID) 500 MG tablet Take 500 mg by mouth daily.    carvedilol (COREG) 3.125 MG tablet Take 1 tablet (3.125 mg total) by mouth 2 (two) times daily with a meal. (Patient not taking: Reported on 09/01/2022)   No facility-administered encounter medications on file as of 12/10/2022.    Allergies (verified) Fish allergy, Fluoxetine, Other, Oxycodone, and Sulfonamide derivatives   History: Past Medical History:  Diagnosis Date   Cholelithiasis    Chronic pain    Depression    Elevated liver enzymes    GERD (gastroesophageal reflux disease)    Hyperlipidemia    Hypertension    Nonspecific elevation of levels of transaminase or lactic acid dehydrogenase (LDH)    Palpitation    Primary biliary cirrhosis (HCC)    Past Surgical History:  Procedure Laterality Date   correction of nasal surgery  35 years ago    Left ovarian cyst removal  1974   ROTATOR CUFF REPAIR Left 08/2016   TONSILLECTOMY  1967    Family History  Problem Relation Age  of Onset   Dementia Mother    Heart failure Father    Hypertension Father    Cancer Brother 77       kidney   Cancer Maternal Aunt        breast   Dementia Maternal Aunt    Social History   Socioeconomic History   Marital status: Married    Spouse name: Not on file   Number of children: 4   Years of education: 16   Highest education level: Associate degree: academic program  Occupational History   Occupation: retired   Tobacco Use   Smoking status: Never   Smokeless tobacco: Never  Vaping Use   Vaping status: Never Used  Substance and Sexual Activity   Alcohol use: No    Drug use: No   Sexual activity: Not Currently  Other Topics Concern   Not on file  Social History Narrative   Expressed to this nurse her frustration with her husband his controlling ways.   Social Determinants of Health   Financial Resource Strain: Low Risk  (12/10/2022)   Overall Financial Resource Strain (CARDIA)    Difficulty of Paying Living Expenses: Not very hard  Food Insecurity: No Food Insecurity (12/10/2022)   Hunger Vital Sign    Worried About Running Out of Food in the Last Year: Never true    Ran Out of Food in the Last Year: Never true  Transportation Needs: No Transportation Needs (12/10/2022)   PRAPARE - Administrator, Civil Service (Medical): No    Lack of Transportation (Non-Medical): No  Physical Activity: Insufficiently Active (12/10/2022)   Exercise Vital Sign    Days of Exercise per Week: 3 days    Minutes of Exercise per Session: 30 min  Stress: No Stress Concern Present (12/10/2022)   Yolanda Foley of Occupational Health - Occupational Stress Questionnaire    Feeling of Stress : Only a little  Social Connections: Socially Integrated (12/10/2022)   Social Connection and Isolation Panel [NHANES]    Frequency of Communication with Friends and Family: More than three times a week    Frequency of Social Gatherings with Friends and Family: More than three times a week    Attends Religious Services: More than 4 times per year    Active Member of [employer] or Organizations: Yes    Attends Engineer, structural: More than 4 times per year    Marital Status: Married    Tobacco Counseling Counseling given: Yes   Clinical Intake:  Pre-visit preparation completed: Yes  Pain : 0-10 Pain Score: 5  Pain Type: Chronic pain Pain Location: Knee Pain Orientation: Right Pain Descriptors / Indicators: Constant Pain Onset: More than a month ago Pain Frequency: Constant     BMI - recorded: 24.79 Nutritional Status: BMI of 19-24   Normal Nutritional Risks: None Diabetes: No  How often do you need to have someone help you when you read instructions, pamphlets, or other written materials from your doctor or pharmacy?: 1 - Never  Interpreter Needed?: No  Information entered by ::  Yolanda Foley, CMA   Activities of Daily Living    12/10/2022    1:56 PM  In your present state of health, do you have any difficulty performing the following activities:  Hearing? 0  Vision? 0  Difficulty concentrating or making decisions? 0  Walking or climbing stairs? 0  Dressing or bathing? 0  Doing errands, shopping? 0  Preparing Food and eating ? N  Using the  Toilet? N  In the past six months, have you accidently leaked urine? N  Do you have problems with loss of bowel control? N  Managing your Medications? N  Managing your Finances? N  Housekeeping or managing your Housekeeping? N    Patient Care Team: Kerri Perches, MD as PCP - General Newman Pies, MD as Attending Physician (Otolaryngology) Malissa Hippo, MD (Inactive) as Attending Physician (Gastroenterology) Newman Pies, MD as Consulting Physician (Otolaryngology) Malissa Hippo, MD (Inactive) as Consulting Physician (Gastroenterology)  Indicate any recent Medical Services you may have received from other than [facility] providers in the past year (date may be approximate).     Assessment:   This is a routine wellness examination for Yolanda Foley.  Hearing/Vision screen Hearing Screening - Comments:: Patient has hearing difficulties and has a hearing aid but does not wear her hearing aids all the time.  Vision Screening - Comments:: Has been 2 almost 3 years since she has seen an eye doctor. Referral placed today.   Dietary issues and exercise activities discussed:     Goals Addressed             This Visit's Progress    Patient Stated       "Improve Health"       Depression Screen    12/10/2022    1:46 PM 05/28/2022    2:42 PM 02/14/2022    4:24 PM  01/02/2021    3:22 PM 01/02/2021    3:05 PM 02/28/2020   11:10 AM 02/28/2020   11:07 AM  PHQ 2/9 Scores  PHQ - 2 Score 1 1 1 2 1  0 0  PHQ- 9 Score 4 4 8 11        Fall Risk    12/10/2022    1:56 PM 05/28/2022    2:42 PM 02/14/2022    4:24 PM 10/02/2021    1:29 PM 08/13/2021    2:51 PM  Fall Risk   Falls in the past year? 0 0 0 0 0  Number falls in past yr: 0 0 0 0 0  Injury with Fall? 0 0 0 0 0  Risk for fall due to : No Fall Risks No Fall Risks No Fall Risks    Follow up Falls prevention discussed Falls evaluation completed Falls evaluation completed      MEDICARE RISK AT HOME:  Medicare Risk at Home - 12/10/22 1351     Any stairs in or around the home? No    If so, are there any without handrails? No    Home free of loose throw rugs in walkways, pet beds, electrical cords, etc? Yes    Adequate lighting in your home to reduce risk of falls? Yes    Life alert? No    Use of a cane, walker or w/c? No    Grab bars in the bathroom? Yes    Shower chair or bench in shower? Yes    Elevated toilet seat or a handicapped toilet? Yes             TIMED UP AND GO:  Was the test performed?  No    Cognitive Function:        12/10/2022    1:46 PM 02/28/2020   11:13 AM 02/25/2019   10:26 AM 02/18/2018    2:19 PM 06/05/2016   11:24 AM  6CIT Screen  What Year? 0 points 0 points 0 points 0 points 0 points  What month? 0 points 0 points  0 points 0 points 0 points  What time? 0 points 0 points 0 points 0 points 0 points  Count back from 20 0 points 0 points 0 points 0 points 0 points  Months in reverse 0 points 0 points 0 points 0 points 0 points  Repeat phrase 0 points 0 points 0 points 2 points 0 points  Total Score 0 points 0 points 0 points 2 points 0 points    Immunizations Immunization History  Administered Date(s) Administered   Fluad Quad(high Dose 65+) 02/09/2020, 02/14/2022   Influenza Split 02/05/2011   Influenza Whole 01/21/2006, 05/31/2007, 02/25/2010    Influenza,inj,Quad PF,6+ Mos 03/15/2013   Moderna Sars-Covid-2 Vaccination 07/07/2019, 08/03/2019, 03/05/2020, 08/23/2020   Pneumococcal Conjugate-13 05/30/2015   Pneumococcal Polysaccharide-23 08/16/2008   Td 08/16/2008    TDAP status: Due, Education has been provided regarding the importance of this vaccine. Advised may receive this vaccine at local pharmacy or Health Dept. Aware to provide a copy of the vaccination record if obtained from local pharmacy or Health Dept. Verbalized acceptance and understanding.  Flu Vaccine status: Due, Education has been provided regarding the importance of this vaccine. Advised may receive this vaccine at local pharmacy or Health Dept. Aware to provide a copy of the vaccination record if obtained from local pharmacy or Health Dept. Verbalized acceptance and understanding.  Pneumococcal vaccine status: Up to date  Covid-19 vaccine status: Information provided on how to obtain vaccines.   Qualifies for Shingles Vaccine? Yes   Zostavax completed No   Shingrix Completed?: No.    Education has been provided regarding the importance of this vaccine. Patient has been advised to call insurance company to determine out of pocket expense if they have not yet received this vaccine. Advised may also receive vaccine at local pharmacy or Health Dept. Verbalized acceptance and understanding.  Screening Tests Health Maintenance  Topic Date Due   Zoster Vaccines- Shingrix (1 of 2) Never done   DTaP/Tdap/Td (2 - Tdap) 08/17/2018   COVID-19 Vaccine (5 - 2023-24 season) 12/27/2021   INFLUENZA VACCINE  11/27/2022   Medicare Annual Wellness (AWV)  12/10/2023   Pneumonia Vaccine 24+ Years old  Completed   DEXA SCAN  Completed   HPV VACCINES  Aged Out    Health Maintenance  Health Maintenance Due  Topic Date Due   Zoster Vaccines- Shingrix (1 of 2) Never done   DTaP/Tdap/Td (2 - Tdap) 08/17/2018   COVID-19 Vaccine (5 - 2023-24 season) 12/27/2021   INFLUENZA VACCINE   11/27/2022    Colorectal cancer screening: No longer required.   Mammogram status: Ordered 05/09/2022. Pt provided with contact info and advised to call to schedule appt.   Bone Density status: Ordered 12/10/2022. Pt provided with contact info and advised to call to schedule appt.  Lung Cancer Screening: (Low Dose CT Chest recommended if Age 87-80 years, 20 pack-year currently smoking OR have quit w/in 15years.) does not qualify.   Additional Screening:  Hepatitis C Screening: does not qualify  Vision Screening: Recommended annual ophthalmology exams for early detection of glaucoma and other disorders of the eye. Is the patient up to date with their annual eye exam?  No  Who is the provider or what is the name of the office in which the patient attends annual eye exams? Referral placed today 12/10/22 If pt is not established with a provider, would they like to be referred to a provider to establish care? Yes .   Dental Screening: Recommended annual dental exams for  proper oral hygiene  Diabetic Foot Exam: n/a  Community Resource Referral / Chronic Care Management: CRR required this visit?  No   CCM required this visit?  No     Plan:     I have personally reviewed and noted the following in the patient's chart:   Medical and social history Use of alcohol, tobacco or illicit drugs  Current medications and supplements including opioid prescriptions. Patient is not currently taking opioid prescriptions. Functional ability and status Nutritional status Physical activity Advanced directives List of other physicians Hospitalizations, surgeries, and ER visits in previous 12 months Vitals Screenings to include cognitive, depression, and falls Referrals and appointments  In addition, I have reviewed and discussed with patient certain preventive protocols, quality metrics, and best practice recommendations. A written personalized care plan for preventive services as well as  general preventive health recommendations were provided to patient.     Jordan Hawks Yolanda Foley, CMA   12/10/2022   After Visit Summary: (Mail) Due to this being a telephonic visit, the after visit summary with patients personalized plan was offered to patient via mail   Nurse Notes: Dexa scan ordered for patient today. She is aware. Eye doctor referral placed as well.

## 2022-12-10 NOTE — Patient Instructions (Signed)
Yolanda Foley , Thank you for taking time to come for your Medicare Wellness Visit. I appreciate your ongoing commitment to your health goals. Please review the following plan we discussed and let me know if I can assist you in the future.   These are the goals we discussed:  Goals      Patient Stated     "Improve Health"        This is a list of the screening recommended for you and due dates:  Health Maintenance  Topic Date Due   Zoster (Shingles) Vaccine (1 of 2) Never done   DTaP/Tdap/Td vaccine (2 - Tdap) 08/17/2018   COVID-19 Vaccine (5 - 2023-24 season) 12/27/2021   Flu Shot  11/27/2022   Medicare Annual Wellness Visit  12/10/2023   Pneumonia Vaccine  Completed   DEXA scan (bone density measurement)  Completed   HPV Vaccine  Aged Out    Advanced directives: Advance directive discussed with you today. Even though you declined this today, please call our office should you change your mind, and we can give you the proper paperwork for you to fill out. Advance care planning is a way to make decisions about medical care that fits your values in case you are ever unable to make these decisions for yourself.  Information on Advanced Care Planning can be found at [facility] Advance Health Care Directives Advance Health Care Directives ([email or web address]    Conditions/risks identified:  You have an order for:  []   2D Mammogram  []   3D Mammogram  [x]   Bone Density   []   Lung Cancer Screening  Please call for appointment:   [facility] [address] [place], [place] [address] [phone]  Make sure to wear two-piece clothing.  No lotions powders or deodorants the day of the appointment Make sure to bring picture ID and insurance card.  Bring list of medications you are currently taking including any supplements.   Schedule your [facility] screening mammogram through MyChart!   Log into your MyChart account.  Go to  'Visit' (or 'Appointments' if on mobile App) --> Schedule an Appointment  Under 'Select a Reason for Visit' choose the Mammogram Screening option.  Complete the pre-visit questions and select the time and place that best fits your schedule.  You have been referred to see an eye doctor. His information is below. If you have not heard from them within the next week, please call their office to schedule your consultation appointment.  Dr. [facility] [place], [place]. [phone] [address] [place], [place] [address] [phone]   Next appointment: VIRTUAL/TELEPHONE APPOINTMENT Follow up in one year for your annual wellness visit  February 03, 2024 at 10:30 am telephone visit. Please be ready for your telephone call up to 20 minutes earlier or later than your scheduled time, depending on how the nurses schedule is running.    Preventive Care 80 Years and Older, Female Preventive care refers to lifestyle choices and visits with your health care provider that can promote health and wellness. What does preventive care include? A yearly physical exam. This is also called an annual well check. Dental exams once or twice a year. Routine eye exams. Ask your health care provider how often you should have your eyes checked. Personal lifestyle choices, including: Daily care of your teeth and gums. Regular physical activity. Eating a healthy diet. Avoiding tobacco and drug use. Limiting alcohol  use. Practicing safe sex. Taking low-dose aspirin every day. Taking vitamin and mineral supplements as recommended by your health care provider. What happens during an annual well check? The services and screenings done by your health care provider during your annual well check will depend on your age, overall health, lifestyle risk factors, and family history of disease. Counseling  Your health care provider may ask you questions about your: Alcohol use. Tobacco use. Drug  use. Emotional well-being. Home and relationship well-being. Sexual activity. Eating habits. History of falls. Memory and ability to understand (cognition). Work and work Astronomer. Reproductive health. Screening  You may have the following tests or measurements: Height, weight, and BMI. Blood pressure. Lipid and cholesterol levels. These may be checked every 5 years, or more frequently if you are over 20 years old. Skin check. Lung cancer screening. You may have this screening every year starting at age 29 if you have a 30-pack-year history of smoking and currently smoke or have quit within the past 15 years. Fecal occult blood test (FOBT) of the stool. You may have this test every year starting at age 97. Flexible sigmoidoscopy or colonoscopy. You may have a sigmoidoscopy every 5 years or a colonoscopy every 10 years starting at age 51. Hepatitis C blood test. Hepatitis B blood test. Sexually transmitted disease (STD) testing. Diabetes screening. This is done by checking your blood sugar (glucose) after you have not eaten for a while (fasting). You may have this done every 1-3 years. Bone density scan. This is done to screen for osteoporosis. You may have this done starting at age 8. Mammogram. This may be done every 1-2 years. Talk to your health care provider about how often you should have regular mammograms. Talk with your health care provider about your test results, treatment options, and if necessary, the need for more tests. Vaccines  Your health care provider may recommend certain vaccines, such as: Influenza vaccine. This is recommended every year. Tetanus, diphtheria, and acellular pertussis (Tdap, Td) vaccine. You may need a Td booster every 10 years. Zoster vaccine. You may need this after age 81. Pneumococcal 13-valent conjugate (PCV13) vaccine. One dose is recommended after age 53. Pneumococcal polysaccharide (PPSV23) vaccine. One dose is recommended after age  82. Talk to your health care provider about which screenings and vaccines you need and how often you need them. This information is not intended to replace advice given to you by your health care provider. Make sure you discuss any questions you have with your health care provider. Document Released: 05/11/2015 Document Revised: 01/02/2016 Document Reviewed: 02/13/2015 Elsevier Interactive Patient Education  2017 [employer].  Fall Prevention in the Home Falls can cause injuries. They can happen to people of all ages. There are many things you can do to make your home safe and to help prevent falls. What can I do on the outside of my home? Regularly fix the edges of walkways and driveways and fix any cracks. Remove anything that might make you trip as you walk through a door, such as a raised step or threshold. Trim any bushes or trees on the path to your home. Use bright outdoor lighting. Clear any walking paths of anything that might make someone trip, such as rocks or tools. Regularly check to see if handrails are loose or broken. Make sure that both sides of any steps have handrails. Any raised decks and porches should have guardrails on the edges. Have any leaves, snow, or ice cleared regularly. Use sand  or salt on walking paths during winter. Clean up any spills in your garage right away. This includes oil or grease spills. What can I do in the bathroom? Use night lights. Install grab bars by the toilet and in the tub and shower. Do not use towel bars as grab bars. Use non-skid mats or decals in the tub or shower. If you need to sit down in the shower, use a plastic, non-slip stool. Keep the floor dry. Clean up any water that spills on the floor as soon as it happens. Remove soap buildup in the tub or shower regularly. Attach bath mats securely with double-sided non-slip rug tape. Do not have throw rugs and other things on the floor that can make you trip. What can I do in the  bedroom? Use night lights. Make sure that you have a light by your bed that is easy to reach. Do not use any sheets or blankets that are too big for your bed. They should not hang down onto the floor. Have a firm chair that has side arms. You can use this for support while you get dressed. Do not have throw rugs and other things on the floor that can make you trip. What can I do in the kitchen? Clean up any spills right away. Avoid walking on wet floors. Keep items that you use a lot in easy-to-reach places. If you need to reach something above you, use a strong step stool that has a grab bar. Keep electrical cords out of the way. Do not use floor polish or wax that makes floors slippery. If you must use wax, use non-skid floor wax. Do not have throw rugs and other things on the floor that can make you trip. What can I do with my stairs? Do not leave any items on the stairs. Make sure that there are handrails on both sides of the stairs and use them. Fix handrails that are broken or loose. Make sure that handrails are as long as the stairways. Check any carpeting to make sure that it is firmly attached to the stairs. Fix any carpet that is loose or worn. Avoid having throw rugs at the top or bottom of the stairs. If you do have throw rugs, attach them to the floor with carpet tape. Make sure that you have a light switch at the top of the stairs and the bottom of the stairs. If you do not have them, ask someone to add them for you. What else can I do to help prevent falls? Wear shoes that: Do not have high heels. Have rubber bottoms. Are comfortable and fit you well. Are closed at the toe. Do not wear sandals. If you use a stepladder: Make sure that it is fully opened. Do not climb a closed stepladder. Make sure that both sides of the stepladder are locked into place. Ask someone to hold it for you, if possible. Clearly mark and make sure that you can see: Any grab bars or  handrails. First and last steps. Where the edge of each step is. Use tools that help you move around (mobility aids) if they are needed. These include: Canes. Walkers. Scooters. Crutches. Turn on the lights when you go into a dark area. Replace any light bulbs as soon as they burn out. Set up your furniture so you have a clear path. Avoid moving your furniture around. If any of your floors are uneven, fix them. If there are any pets around you, be aware  of where they are. Review your medicines with your doctor. Some medicines can make you feel dizzy. This can increase your chance of falling. Ask your doctor what other things that you can do to help prevent falls. This information is not intended to replace advice given to you by your health care provider. Make sure you discuss any questions you have with your health care provider. Document Released: 02/08/2009 Document Revised: 09/20/2015 Document Reviewed: 05/19/2014 Elsevier Interactive Patient Education  2017 Elsevier Inc. Bone Density Test A bone density test uses a type of X-ray to measure the amount of calcium and other minerals in a person's bones. It can measure bone density in the hip and the spine. The test is similar to having a regular X-ray. This test may also be called: Bone densitometry. Bone mineral density test. Dual-energy X-ray absorptiometry (DEXA). You may have this test to: Diagnose a condition that causes weak or thin bones (osteoporosis). Screen you for osteoporosis. Predict your risk for a broken bone (fracture). Determine how well your osteoporosis treatment is working. Tell a health care provider about: Any allergies you have. All medicines you are taking, including vitamins, herbs, eye drops, creams, and over-the-counter medicines. Any problems you or family members have had with anesthetic medicines. Any blood disorders you have. Any surgeries you have had. Any medical conditions you have. Whether you  are pregnant or may be pregnant. Any medical tests you have had within the past 14 days that used contrast material. What are the risks? Generally, this is a safe test. However, it does expose you to a small amount of radiation, which can slightly increase your cancer risk. What happens before the test? Do not take any calcium supplements within the 24 hours before your test. You will need to remove all metal jewelry, eyeglasses, removable dental appliances, and any other metal objects on your body. What happens during the test?  You will lie down on an exam table. There will be an X-ray generator below you and an imaging device above you. Other devices, such as boxes or braces, may be used to position your body properly for the scan. The machine will slowly scan your body. You will need to keep very still while the machine does the scan. The images will show up on a screen in the room. Images will be examined by a specialist after your test is finished. The procedure may vary among health care providers and hospitals. What can I expect after the test? It is up to you to get the results of your test. Ask your health care provider, or the department that is doing the test, when your results will be ready. Summary A bone density test is an imaging test that uses a type of X-ray to measure the amount of calcium and other minerals in your bones. The test may be used to diagnose or screen you for a condition that causes weak or thin bones (osteoporosis), predict your risk for a broken bone (fracture), or determine how well your osteoporosis treatment is working. Do not take any calcium supplements within 24 hours before your test. Ask your health care provider, or the department that is doing the test, when your results will be ready. This information is not intended to replace advice given to you by your health care provider. Make sure you discuss any questions you have with your health care  provider. Document Revised: 12/26/2020 Document Reviewed: 09/29/2019 Elsevier Patient Education  2024 Elsevier Inc.  Hearing Loss Hearing  loss is a partial or total loss of the ability to hear. This can be temporary or permanent, and it can happen in one or both ears. There are two types of hearing loss. You can have just one type or both types. You may have a problem with: Damage to your hearing nerves (sensorineural hearing loss). This type of hearing loss is more likely to be permanent. A hearing aid is often the best treatment. Sound getting to your inner ear (conductive hearing loss). This type of hearing loss can usually be treated medically or surgically. Medical care is necessary to treat hearing loss properly and to prevent the condition from getting worse. Your hearing may partially or completely come back, depending on what caused your hearing loss and how severe it is. In some cases, hearing loss is permanent. What are the causes? Common causes of hearing loss include: Too much wax in the ear canal. Infection of the ear canal or middle ear. Fluid in the middle ear. Injury to the ear or surrounding area. An object stuck in the ear. A history of prolonged exposure to loud sounds, such as music. Less common causes of hearing loss include: Tumors in the ear. Viral or bacterial infections, such as meningitis. A hole in the eardrum (perforated eardrum). Problems with the hearing nerve that sends signals between the brain and the ear. Certain medicines. What are the signs or symptoms? Symptoms of this condition may include: Difficulty telling the difference between sounds. Difficulty following a conversation when there is background noise. Lack of response to sounds in your environment. This may be most noticeable when you do not respond to startling sounds. Needing to turn up the volume on the television, radio, or other devices. Ringing in the ears. Dizziness. How is this  diagnosed? This condition is diagnosed based on: A physical exam. A hearing test (audiometry). The test will be performed by a hearing specialist (audiologist). Imaging tests, such as an MRI or CT scan. You may also be referred to an ear, nose, and throat (ENT) specialist (otolaryngologist). How is this treated? Treatment for hearing loss may include: Earwax removal. Medicines to treat or prevent infection (antibiotics). Medicines to reduce inflammation (corticosteroids). Hearing aids or assistive listening devices. Follow these instructions at home: If you were prescribed an antibiotic medicine, take it as told by your health care provider. Do not stop taking the antibiotic even if you start to feel better. Take over-the-counter and prescription medicines only as told by your health care provider. Avoid loud noises. Use hearing protection if you are exposed to loud noises or work in a noisy environment. Return to your normal activities as told by your health care provider. Ask your health care provider what activities are safe for you. Keep all follow-up visits. This is important. Contact a health care provider if: You feel dizzy. You develop new symptoms. You vomit or feel nauseous. You have a fever. Get help right away if: You develop sudden changes in your vision. You have severe ear pain. You have new or increased weakness. You have a severe headache. Summary Hearing loss is a decreased ability to hear sounds around you. It can be temporary or permanent. Treatment will depend on the cause of your hearing loss. It may include earwax removal, medicines, or a hearing aid. Your hearing may partially or completely come back, depending on what caused your hearing loss and how severe it is. Keep all follow-up visits. This is important. This information is not intended to  replace advice given to you by your health care provider. Make sure you discuss any questions you have with your  health care provider. Document Revised: 02/21/2021 Document Reviewed: 02/21/2021 Elsevier Patient Education  2024 [employer].

## 2022-12-25 ENCOUNTER — Encounter: Payer: Self-pay | Admitting: Family Medicine

## 2022-12-25 ENCOUNTER — Ambulatory Visit (INDEPENDENT_AMBULATORY_CARE_PROVIDER_SITE_OTHER): Payer: Medicare Other | Admitting: Family Medicine

## 2022-12-25 VITALS — BP 160/84 | HR 78 | Ht 63.0 in | Wt 146.1 lb

## 2022-12-25 DIAGNOSIS — F5104 Psychophysiologic insomnia: Secondary | ICD-10-CM

## 2022-12-25 DIAGNOSIS — I10 Essential (primary) hypertension: Secondary | ICD-10-CM

## 2022-12-25 DIAGNOSIS — F320 Major depressive disorder, single episode, mild: Secondary | ICD-10-CM

## 2022-12-25 DIAGNOSIS — E7849 Other hyperlipidemia: Secondary | ICD-10-CM | POA: Diagnosis not present

## 2022-12-25 DIAGNOSIS — M1711 Unilateral primary osteoarthritis, right knee: Secondary | ICD-10-CM | POA: Diagnosis not present

## 2022-12-25 DIAGNOSIS — K743 Primary biliary cirrhosis: Secondary | ICD-10-CM | POA: Diagnosis not present

## 2022-12-25 MED ORDER — OLMESARTAN MEDOXOMIL 40 MG PO TABS: 40.0000 mg | ORAL_TABLET | Freq: Every day | ORAL | 3 refills | Status: DC

## 2022-12-25 MED ORDER — MELATONIN 1 MG PO TABS: 1.0000 mg | ORAL_TABLET | Freq: Every day | ORAL | 3 refills | Status: DC

## 2022-12-25 NOTE — Patient Instructions (Addendum)
F/U in 8 to 10   weeks re evaluate blood pressure, call if you need me sooner  Labs for GI today upstairs, already entered  You will be referred to Orthopedics re right knee in [place], soonest available  Careful not to fall  New for sleep melatonin 1 mg daily  Higher dose olmesartan 40 m ONCE daily  You are referred for therapy, on review does not qualify currently so not referred, ot indicated  Thanks for choosing [facility], we consider it a privelige to serve you.

## 2022-12-30 ENCOUNTER — Encounter: Payer: Self-pay | Admitting: Family Medicine

## 2022-12-30 DIAGNOSIS — M1711 Unilateral primary osteoarthritis, right knee: Secondary | ICD-10-CM | POA: Insufficient documentation

## 2022-12-30 NOTE — Assessment & Plan Note (Signed)
Increased pain and swelling, refer Ortho for eval and management

## 2022-12-30 NOTE — Assessment & Plan Note (Signed)
Hyperlipidemia:Low fat diet discussed and encouraged.   Lipid Panel  Lab Results  Component Value Date   CHOL 180 08/01/2021   HDL 64 08/01/2021   LDLCALC 104 (H) 08/01/2021   TRIG 64 08/01/2021   CHOLHDL 2.8 08/01/2021     Updated lab needed at/ before next visit. Needs to reduce fat in diet

## 2022-12-30 NOTE — Assessment & Plan Note (Signed)
Uncontrolled inc olmesartan dose and re eval DASH diet and commitment to daily physical activity for a minimum of 30 minutes discussed and encouraged, as a part of hypertension management. The importance of attaining a healthy weight is also discussed.     12/25/2022    2:11 PM 12/25/2022    1:52 PM 12/25/2022    1:51 PM 12/10/2022    1:39 PM 05/28/2022    3:09 PM 05/28/2022    2:44 PM 05/28/2022    2:39 PM  BP/Weight  Systolic BP 160 145 156 120 152 172 176  Diastolic BP 84 83 74 77 80 78 78  Wt. (Lbs)   146.08 149   149  BMI   25.88 kg/m2 24.79 kg/m2   24.79 kg/m2

## 2022-12-30 NOTE — Assessment & Plan Note (Signed)
Score reflects fair control, not qualifying for therapy at this time

## 2022-12-30 NOTE — Assessment & Plan Note (Signed)
Sleep hygiene reviewed and written information offered also. Low dose melatonin is recommended and prescribed though it is  OTC.

## 2022-12-30 NOTE — Progress Notes (Signed)
Yolanda Foley     MRN: [id number]      DOB: 1941-01-29  Chief Complaint  Patient presents with   Follow-up    Follow up knee pain husband is sick     HPI Ms. Farooqui is here for follow up and re-evaluation of chronic medical conditions, medication management and review of any available recent lab and radiology data.  Preventive health is updated, specifically  Cancer screening and Immunization.   Questions or concerns regarding consultations or procedures which the PT has had in the interim are  addressed. The PT denies any adverse reactions to current medications since the last visit.  Increased and uncontrolled right knee pai x 1 2 weeks, no direct trauma, able to weight pair but is debilitate C/o increasing stress as  spouse's health deteriorates, is interested in therapy, however depression and anxiety scores at visit do not reflect a need at this time  ROS Denies recent fever or chills. Denies sinus pressure, nasal congestion, ear pain or sore throat. Denies chest congestion, productive cough or wheezing. Denies chest pains, palpitations and leg swelling Denies abdominal pain, nausea, vomiting,diarrhea or constipation.   Denies dysuria, frequency, hesitancy or incontinence.  Denies headaches, seizures, numbness, or tingling.  Denies skin break down or rash.   PE  BP (!) 160/84   Pulse 78   Ht 5\' 3"  (1.6 m)   Wt 146 lb 1.3 oz (66.3 kg)   SpO2 95%   BMI 25.88 kg/m   Patient alert and oriented and in no cardiopulmonary distress.  HEENT: No facial asymmetry, EOMI,     Neck supple .  Chest: Clear to auscultation bilaterally.  CVS: S1, S2 no murmurs, no S3.Regular rate.  ABD: Soft non tender.   Ext: No edema  MS: Adequate ROM spine, shoulders, hips and markedly reduced in right knee.  Skin: Intact, no ulcerations or rash noted.  Psych: Good eye contact, normal affect. Mildly anxious and  depressed appearing.  CNS: CN 2-12 intact, power,  normal throughout.no  focal deficits noted.   Assessment & Plan  Essential hypertension Uncontrolled inc olmesartan dose and re eval DASH diet and commitment to daily physical activity for a minimum of 30 minutes discussed and encouraged, as a part of hypertension management. The importance of attaining a healthy weight is also discussed.     12/25/2022    2:11 PM 12/25/2022    1:52 PM 12/25/2022    1:51 PM 12/10/2022    1:39 PM 05/28/2022    3:09 PM 05/28/2022    2:44 PM 05/28/2022    2:39 PM  BP/Weight  Systolic BP 160 145 156 120 152 172 176  Diastolic BP 84 83 74 77 80 78 78  Wt. (Lbs)   146.08 149   149  BMI   25.88 kg/m2 24.79 kg/m2   24.79 kg/m2       Depression Score reflects fair control, not qualifying for therapy at this time  Insomnia Sleep hygiene reviewed and written information offered also. Low dose melatonin is recommended and prescribed though it is  OTC.   Hepatic cirrhosis due to primary biliary cholangitis (HCC) F/u with gI upcoming and labs requested are to be drawn today  Hyperlipemia Hyperlipidemia:Low fat diet discussed and encouraged.   Lipid Panel  Lab Results  Component Value Date   CHOL 180 08/01/2021   HDL 64 08/01/2021   LDLCALC 104 (H) 08/01/2021   TRIG 64 08/01/2021   CHOLHDL 2.8 08/01/2021  Updated lab needed at/ before next visit. Needs to reduce fat in diet   Osteoarthritis of right knee Increased pain and swelling, refer Ortho for eval and management

## 2022-12-30 NOTE — Assessment & Plan Note (Signed)
F/u with gI upcoming and labs requested are to be drawn today

## 2023-01-15 ENCOUNTER — Other Ambulatory Visit (INDEPENDENT_AMBULATORY_CARE_PROVIDER_SITE_OTHER): Payer: Medicare Other

## 2023-01-15 ENCOUNTER — Ambulatory Visit (INDEPENDENT_AMBULATORY_CARE_PROVIDER_SITE_OTHER): Payer: Medicare Other | Admitting: Orthopaedic Surgery

## 2023-01-15 DIAGNOSIS — G8929 Other chronic pain: Secondary | ICD-10-CM

## 2023-01-15 DIAGNOSIS — M25561 Pain in right knee: Secondary | ICD-10-CM

## 2023-01-15 DIAGNOSIS — M1711 Unilateral primary osteoarthritis, right knee: Secondary | ICD-10-CM | POA: Diagnosis not present

## 2023-01-16 DIAGNOSIS — M1711 Unilateral primary osteoarthritis, right knee: Secondary | ICD-10-CM

## 2023-01-16 MED ORDER — LIDOCAINE HCL 1 % IJ SOLN
0.5000 mL | INTRAMUSCULAR | Status: AC | PRN
Start: 2023-01-16 — End: 2023-01-16
  Administered 2023-01-16: .5 mL

## 2023-01-16 MED ORDER — METHYLPREDNISOLONE ACETATE 40 MG/ML IJ SUSP
40.0000 mg | INTRAMUSCULAR | Status: AC | PRN
Start: 2023-01-16 — End: 2023-01-16
  Administered 2023-01-16: 40 mg via INTRA_ARTICULAR

## 2023-01-16 MED ORDER — BUPIVACAINE HCL 0.25 % IJ SOLN
4.0000 mL | INTRAMUSCULAR | Status: AC | PRN
Start: 2023-01-16 — End: 2023-01-16
  Administered 2023-01-16: 4 mL via INTRA_ARTICULAR

## 2023-01-16 NOTE — Progress Notes (Signed)
Office Visit Note   Patient: Yolanda Foley           Date of Birth: [birth year]           MRN: [id number] Visit Date: 01/15/2023              Requested by: Kerri Perches, MD [address] [place],  [place] [address] PCP: Kerri Perches, MD   Assessment & Plan: Visit Diagnoses:  1. Chronic pain of right knee   2. Primary osteoarthritis of right knee     Plan: Right knee injection performed she was anxious about the injection but states it really did not hurt much tolerated it well.  She can follow-up in a month or 2 if she is having ongoing symptoms.  X-rays were reviewed.  Follow-Up Instructions: No follow-ups on file.   Orders:  Orders Placed This Encounter  Procedures   XR KNEE 3 VIEW RIGHT   No orders of the defined types were placed in this encounter.     Procedures: Large Joint Inj: R knee on 01/16/2023 10:08 AM Indications: pain and joint swelling Details: 22 G 1.5 in needle, anterolateral approach  Arthrogram: No  Medications: 40 mg methylPREDNISolone acetate 40 MG/ML; 0.5 mL lidocaine 1 %; 4 mL bupivacaine 0.25 % Outcome: tolerated well, no immediate complications Procedure, treatment alternatives, risks and benefits explained, specific risks discussed. Consent was given by the patient. Immediately prior to procedure a time out was called to verify the correct patient, procedure, equipment, support staff and site/side marked as required. Patient was prepped and draped in the usual sterile fashion.       Clinical Data: No additional findings.   Subjective: Chief Complaint  Patient presents with   Right Knee - Pain    For over 10 yrs worse in the past mo. Has been told that her knee is "bone on bone"    HPI 82 year old female with progressive knee arthritis worse on the right knee than left knee but still ambulatory in the community.  She uses topical rubs lidocaine.  Mild intermittent swelling of her right knee.  Never had an injection  has been through therapy.  Currently pain does not limit her from going any place.  She notes stiffness.  Review of Systems all systems update noncontributory prediabetes.  Positive for hypertension.   Objective: Vital Signs: There were no vitals taken for this visit.  Physical Exam Constitutional:      Appearance: She is well-developed.  HENT:     Head: Normocephalic.     Right Ear: External ear normal.     Left Ear: External ear normal. There is no impacted cerumen.  Eyes:     Pupils: Pupils are equal, round, and reactive to light.  Neck:     Thyroid: No thyromegaly.     Trachea: No tracheal deviation.  Cardiovascular:     Rate and Rhythm: Normal rate.  Pulmonary:     Effort: Pulmonary effort is normal.  Abdominal:     Palpations: Abdomen is soft.  Musculoskeletal:     Cervical back: No rigidity.  Skin:    General: Skin is warm and dry.  Neurological:     Mental Status: She is alert and oriented to person, place, and time.  Psychiatric:        Behavior: Behavior normal.     Ortho Exam full extension right knee mild crepitus knee range of motion positive patellofemoral loading with crepitus.  Negative logroll  hips.  No nerve root tension signs. Fairly rapid right gait without anything more than mild right knee limping.  Medial more than lateral joint line tenderness. Specialty Comments:  No specialty comments available.  Imaging: XR KNEE 3 VIEW RIGHT  Result Date: 01/16/2023 Standing AP both knees lateral right knee sunrise patella x-ray demonstrates some knee osteoarthritis joint space narrowing and small spurs more medial patellofemoral and medial compartment.  Opposite right knee shows mild changes as well. Impression: Right knee osteoarthritis medial and patellofemoral compartment.    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   High serum ferritin 11/24/2019   Low platelet count (HCC) 11/24/2019   Light headedness 10/14/2018   Stress and adjustment reaction 08/22/2018   Overweight (BMI 25.0-29.9) 03/12/2018   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   Osteoporosis 03/29/2013   Depression 03/30/2012   Hearing loss 03/30/2012   Prediabetes 10/09/2011   Transaminasemia 07/08/2011   Insomnia 02/09/2011   Cholelithiasis 08/05/2010   Anemia 06/22/2009   FATIGUE 06/22/2009   ALLERGIC RHINITIS, SEASONAL 03/16/2008   Essential hypertension 07/19/2007   GERD 07/19/2007   Hyperlipemia 04/23/2007   Past Medical History:  Diagnosis Date   Cholelithiasis    Chronic pain    Depression    Elevated liver enzymes    GERD (gastroesophageal reflux disease)    Hyperlipidemia    Hypertension    Nonspecific elevation of levels of transaminase or lactic acid dehydrogenase (LDH)    Palpitation    Primary biliary cirrhosis (HCC)     Family History  Problem Relation Age of Onset   Dementia Mother    Heart failure Father    Hypertension Father    Cancer Brother 30       kidney   Cancer Maternal Aunt        breast   Dementia Maternal Aunt     Past Surgical History:  Procedure Laterality Date   correction of nasal surgery  35 years ago    Left ovarian cyst removal  1974   ROTATOR CUFF REPAIR Left 08/2016   TONSILLECTOMY  1967    Social History   Occupational History   Occupation: retired   Tobacco Use   Smoking status: Never   Smokeless tobacco: Never  Vaping Use   Vaping status: Never Used  Substance and Sexual Activity   Alcohol use: No   Drug use: No   Sexual activity: Not Currently

## 2023-02-19 ENCOUNTER — Ambulatory Visit: Payer: Medicare Other | Admitting: Family Medicine

## 2023-03-07 IMAGING — US US LIVER ELASTOGRAPHY
1 series · 12 of 25 positions shown · non-contrast
Comparison: None.

CLINICAL DATA: Fibrosis, PBC

EXAM:
US LIVER ELASTOGRAPHY
TECHNIQUE: Sonography of the liver was performed. In addition, ultrasound
elastography evaluation of the liver was performed. A region of
interest was placed within the right lobe of the liver. Following
application of a compressive sonographic pulse, tissue
compressibility was assessed. Multiple assessments were performed at
the selected site. Median tissue compressibility was determined.
Previously, hepatic stiffness was assessed by shear wave velocity.
Based on recently published Society of Radiologists in Ultrasound
consensus article, reporting is now recommended to be performed in
the SI units of pressure (kiloPascals) representing hepatic
stiffness/elasticity. The obtained result is compared to the
published reference standards. (cACLD = compensated Advanced Chronic
Liver Disease)

[Series 1: us elastography liver · 12 of 38 slices shown]
[im 2/38]
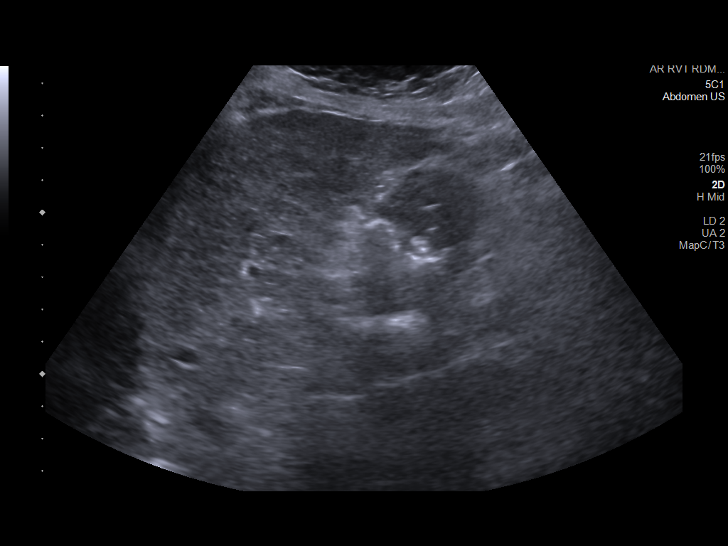
[im 5/38]
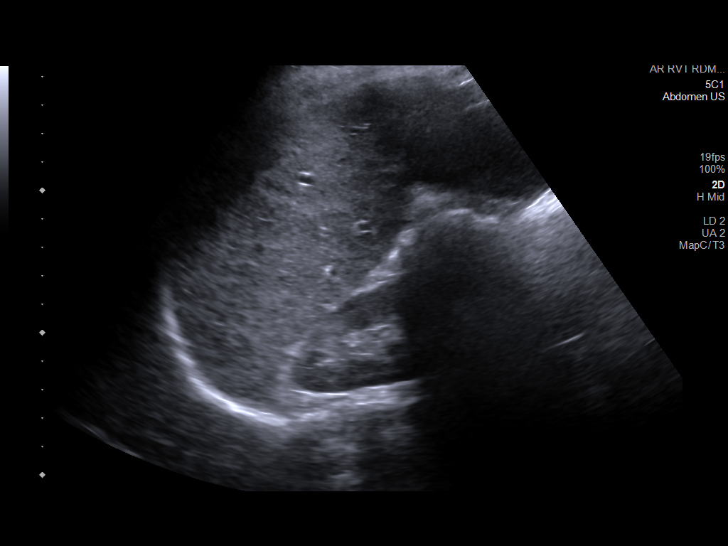
[im 8/38]
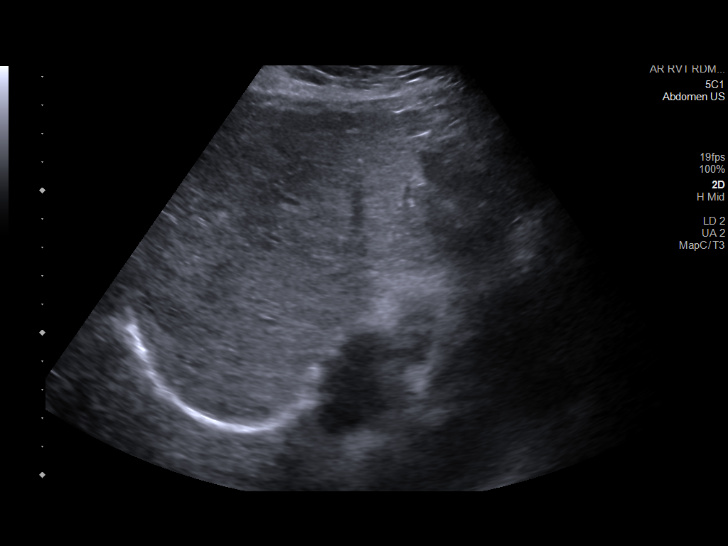
[im 11/38]
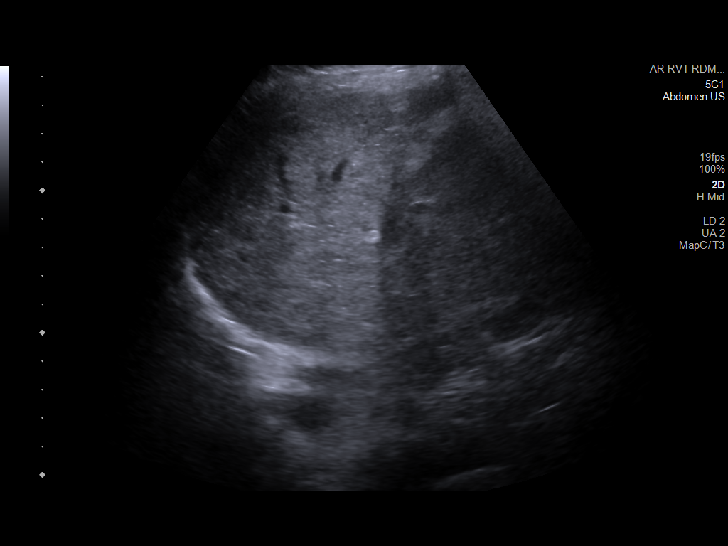
[im 14/38]
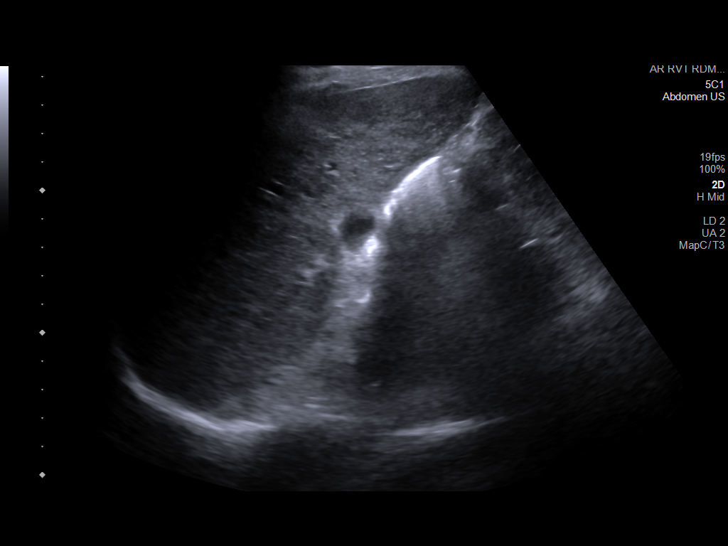
[im 17/38]
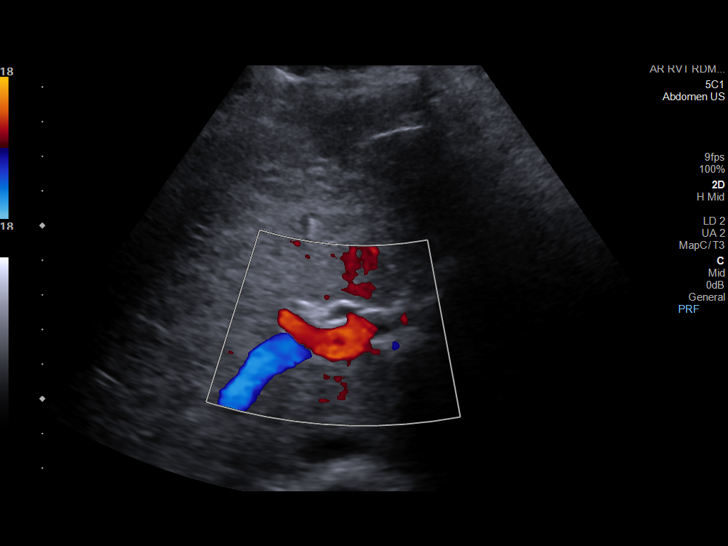
[im 21/38]
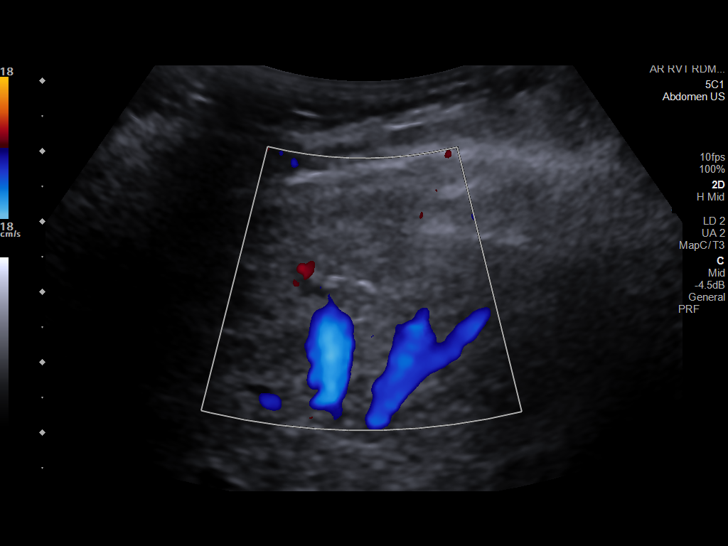
[im 24/38]
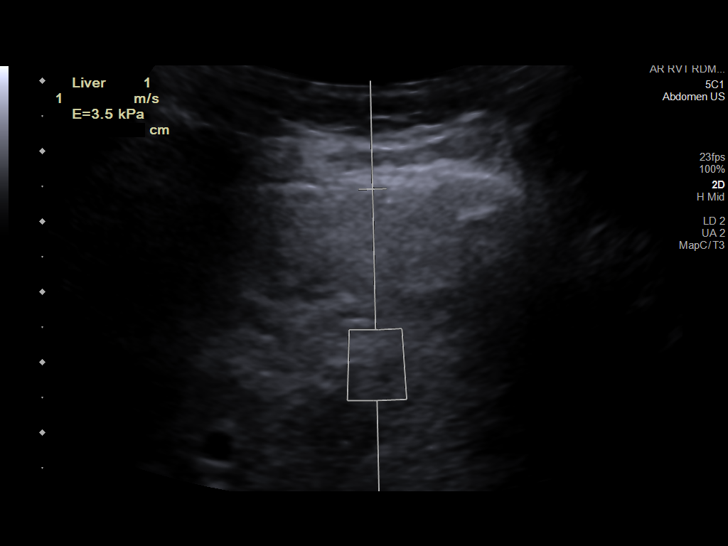
[im 27/38]
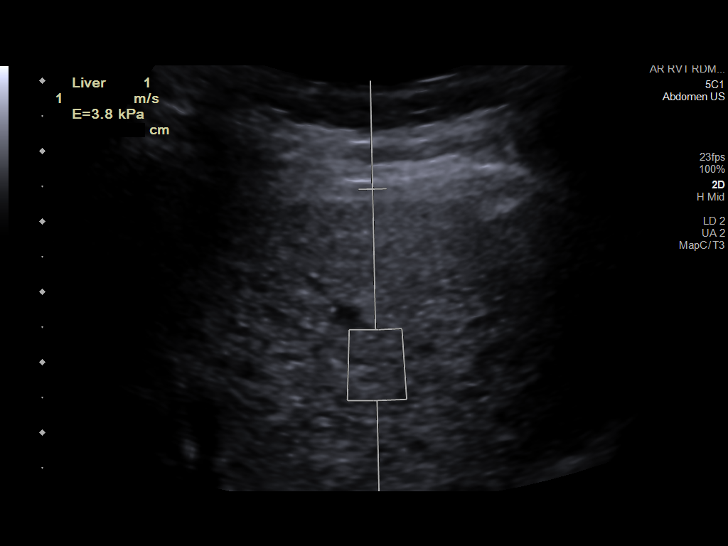
[im 30/38]
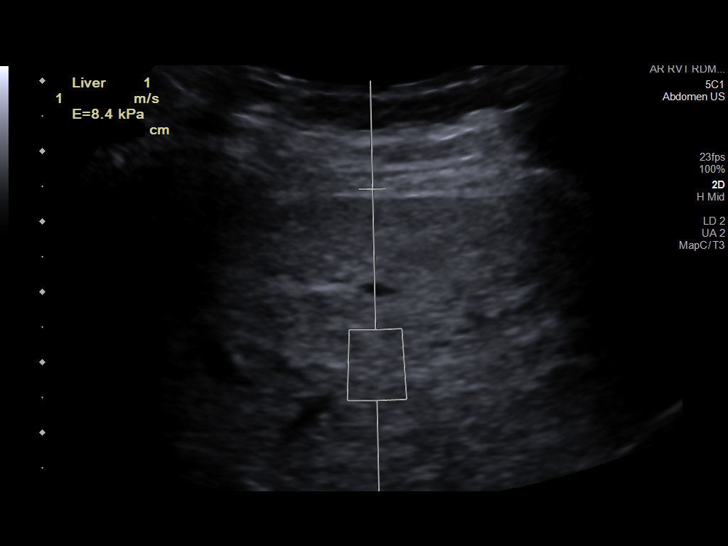
[im 33/38]
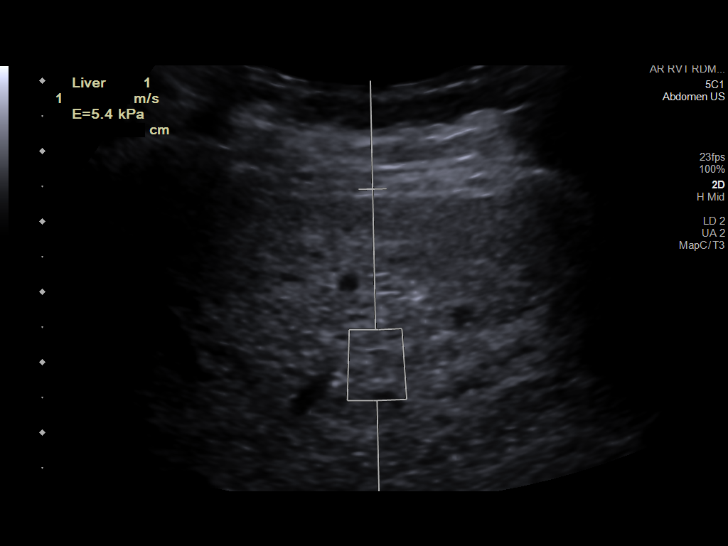
[im 36/38]
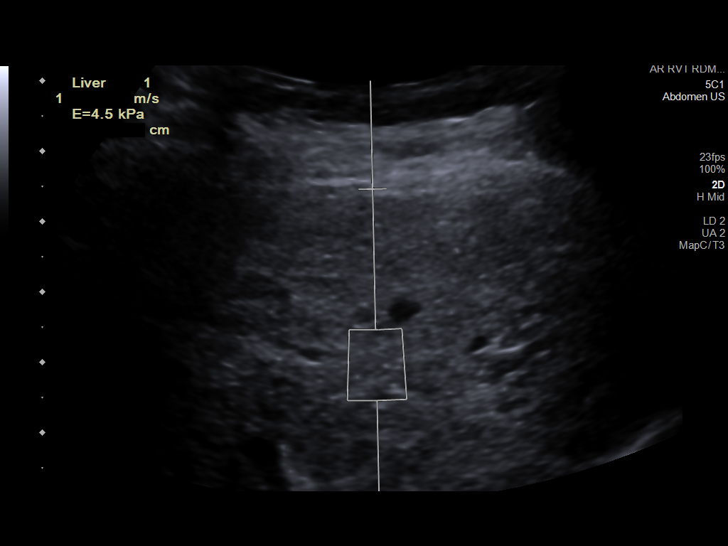

[12 of 25 positions shown; findings below may reference images not displayed]

FINDINGS: Liver: Diffusely heterogeneous echotexture and increased echotexture
throughout the liver. Portal vein is patent on color Doppler imaging
with normal direction of blood flow towards the liver.

ULTRASOUND HEPATIC ELASTOGRAPHY

Device: Siemens Helix VTQ

Patient position: Supine

Transducer: [id number]

Number of measurements: 10

Hepatic segment:  8

Median kPa:

IQR:

IQR/Median kPa ratio:

Data quality:  Good

Diagnostic category:  < or = 5 kPa: high probability of being normal

The use of hepatic elastography is applicable to patients with viral
hepatitis and non-alcoholic fatty liver disease. At this time, there
is insufficient data for the referenced cut-off values and use in
other causes of liver disease, including alcoholic liver disease.
Patients, however, may be assessed by elastography and serve as
their own reference standard/baseline.

In patients with non-alcoholic liver disease, the values suggesting
compensated advanced chronic liver disease (cACLD) may be lower, and
patients may need additional testing with elasticity results of [DATE]
kPa.

Please note that abnormal hepatic elasticity and shear wave
velocities may also be identified in clinical settings other than
with hepatic fibrosis, such as: acute hepatitis, elevated right
heart and central venous pressures including use of beta blockers,
Kuan disease ([place]), infiltrative processes such as
mastocytosis/amyloidosis/infiltrative tumor/lymphoma, extrahepatic
cholestasis, with hyperemia in the post-prandial state, and with
liver transplantation. Correlation with patient history, laboratory
data, and clinical condition recommended.

Diagnostic Categories:

< or =5 kPa: high probability of being normal

< or =9 kPa: in the absence of other known clinical signs, rules [DATE] kPa and ?13 kPa: suggestive of cACLD, but needs further testing

>13 kPa: highly suggestive of cACLD

> or =17 kPa: highly suggestive of cACLD with an increased
probability of clinically significant portal hypertension
IMPRESSION: ULTRASOUND LIVER:

Heterogeneous, increased echotexture throughout the liver compatible
with fatty infiltration or intrinsic liver disease.

ULTRASOUND HEPATIC ELASTOGRAPHY:

Median kPa:

Diagnostic category:  < or = 5 kPa: high probability of being normal

## 2023-03-09 ENCOUNTER — Ambulatory Visit (INDEPENDENT_AMBULATORY_CARE_PROVIDER_SITE_OTHER): Payer: Medicare Other | Admitting: Gastroenterology

## 2023-03-12 ENCOUNTER — Telehealth (INDEPENDENT_AMBULATORY_CARE_PROVIDER_SITE_OTHER): Payer: Self-pay

## 2023-03-12 DIAGNOSIS — K743 Primary biliary cirrhosis: Secondary | ICD-10-CM | POA: Diagnosis not present

## 2023-03-12 DIAGNOSIS — K7469 Other cirrhosis of liver: Secondary | ICD-10-CM | POA: Diagnosis not present

## 2023-03-12 NOTE — Telephone Encounter (Signed)
Error

## 2023-03-13 ENCOUNTER — Other Ambulatory Visit: Payer: Self-pay | Admitting: Family Medicine

## 2023-03-13 ENCOUNTER — Encounter: Payer: Self-pay | Admitting: Family Medicine

## 2023-03-13 ENCOUNTER — Ambulatory Visit (INDEPENDENT_AMBULATORY_CARE_PROVIDER_SITE_OTHER): Payer: Medicare Other | Admitting: Family Medicine

## 2023-03-13 VITALS — BP 177/80 | HR 85 | Ht 63.0 in | Wt 146.1 lb

## 2023-03-13 DIAGNOSIS — Z6379 Other stressful life events affecting family and household: Secondary | ICD-10-CM

## 2023-03-13 DIAGNOSIS — I10 Essential (primary) hypertension: Secondary | ICD-10-CM | POA: Diagnosis not present

## 2023-03-13 DIAGNOSIS — Z23 Encounter for immunization: Secondary | ICD-10-CM | POA: Diagnosis not present

## 2023-03-13 DIAGNOSIS — K743 Primary biliary cirrhosis: Secondary | ICD-10-CM

## 2023-03-13 LAB — COMPREHENSIVE METABOLIC PANEL
ALT: 26 [IU]/L (ref 0–32)
AST: 35 [IU]/L (ref 0–40)
Albumin: 4 g/dL (ref 3.7–4.7)
Alkaline Phosphatase: 225 [IU]/L — ABNORMAL HIGH (ref 44–121)
BUN/Creatinine Ratio: 18 (ref 12–28)
BUN: 14 mg/dL (ref 8–27)
Bilirubin Total: 0.3 mg/dL (ref 0.0–1.2)
CO2: 24 mmol/L (ref 20–29)
Calcium: 9.2 mg/dL (ref 8.7–10.3)
Chloride: 102 mmol/L (ref 96–106)
Creatinine, Ser: 0.78 mg/dL (ref 0.57–1.00)
Globulin, Total: 4.4 g/dL (ref 1.5–4.5)
Glucose: 112 mg/dL — ABNORMAL HIGH (ref 70–99)
Potassium: 4 mmol/L (ref 3.5–5.2)
Sodium: 140 mmol/L (ref 134–144)
Total Protein: 8.4 g/dL (ref 6.0–8.5)
eGFR: 75 mL/min/{1.73_m2} (ref >59)

## 2023-03-13 LAB — CBC
Hematocrit: 33.7 % — ABNORMAL LOW (ref 34.0–46.6)
Hemoglobin: 10.6 g/dL — ABNORMAL LOW (ref 11.1–15.9)
MCH: 28.5 pg (ref 26.6–33.0)
MCHC: 31.5 g/dL (ref 31.5–35.7)
MCV: 91 fL (ref 79–97)
Platelets: 79 10*3/uL — CL (ref 150–450)
RBC: 3.72 x10E6/uL — ABNORMAL LOW (ref 3.77–5.28)
RDW: 13.1 % (ref 11.7–15.4)
WBC: 4.2 10*3/uL (ref 3.4–10.8)

## 2023-03-13 LAB — PROTIME-INR
INR: 1 (ref 0.9–1.2)
Prothrombin Time: 11.3 s (ref 9.1–12.0)

## 2023-03-13 LAB — AFP TUMOR MARKER: AFP, Serum, Tumor Marker: 7.8 ng/mL (ref 0.0–8.7)

## 2023-03-13 MED ORDER — OLMESARTAN MEDOXOMIL 40 MG PO TABS: 40.0000 mg | ORAL_TABLET | Freq: Every day | ORAL | 5 refills | Status: DC

## 2023-03-13 NOTE — Patient Instructions (Addendum)
F/U in 10 to 12  weeks, call if you need me sooner  Nurse BP check in 2 weeks  Blood pressure is  too high, you NEED to take olmesartan 40 mg every evening at 10 pm. Uncontrolled blood pressure increases your risk of stroke, kidney failure and heart failure. THESE can be prevented if you control your blood pressure  Flu vaccine today  PLEASE reschedule appt with gI and take ursodiol as prescribed  Thanks for choosing [facility], we consider it a privelige to serve you.

## 2023-03-16 ENCOUNTER — Other Ambulatory Visit (INDEPENDENT_AMBULATORY_CARE_PROVIDER_SITE_OTHER): Payer: Self-pay

## 2023-03-16 DIAGNOSIS — D696 Thrombocytopenia, unspecified: Secondary | ICD-10-CM

## 2023-03-24 DIAGNOSIS — Z6379 Other stressful life events affecting family and household: Secondary | ICD-10-CM | POA: Insufficient documentation

## 2023-03-24 DIAGNOSIS — Z23 Encounter for immunization: Secondary | ICD-10-CM | POA: Insufficient documentation

## 2023-03-24 NOTE — Assessment & Plan Note (Signed)
After obtaining informed consent, the influenza vaccine is  administered , with no adverse effect noted at the time of administration.

## 2023-03-24 NOTE — Progress Notes (Signed)
   Yolanda Foley     MRN: [id number]      DOB: [birth year]  Chief Complaint  Patient presents with   Follow-up    Follow up, concerned husband is in rehab     HPI Yolanda Foley is here for follow up and re-evaluation of chronic medical conditions, medication management and review of any available recent lab and radiology data.  Preventive health is updated, specifically  Cancer screening and Immunization.   Questions or concerns regarding consultations or procedures which the PT has had in the interim are  addressed. The PT denies any adverse reactions to current medications since the last visit.  There are no new concerns.  There are no specific complaints   ROS Denies recent fever or chills. Denies sinus pressure, nasal congestion, ear pain or sore throat. Denies chest congestion, productive cough or wheezing. Denies chest pains, palpitations and leg swelling Denies abdominal pain, nausea, vomiting,diarrhea or constipation.   Denies dysuria, frequency, hesitancy or incontinence. Denies joint pain, swelling and limitation in mobility. Denies headaches, seizures, numbness, or tingling. Denies depression, anxiety or insomnia. Denies skin break down or rash.   PE  BP (!) 177/80 (BP Location: Right Arm, Patient Position: Sitting, Cuff Size: Normal)   Pulse 85   Ht 5\' 3"  (1.6 m)   Wt 146 lb 1.9 oz (66.3 kg)   SpO2 97%   BMI 25.88 kg/m   Patient alert and oriented and in no cardiopulmonary distress.  HEENT: No facial asymmetry, EOMI,     Neck supple .  Chest: Clear to auscultation bilaterally.  CVS: S1, S2 no murmurs, no S3.Regular rate.  ABD: Soft non tender.   Ext: No edema  MS: Adequate ROM spine, shoulders, hips and knees.  Skin: Intact, no ulcerations or rash noted.  Psych: Good eye contact, normal affect. Memory intact not anxious or depressed appearing.  CNS: CN 2-12 intact, power,  normal throughout.no focal deficits noted.   Assessment & Plan  Essential  hypertension Uncontrolled, non compliant with treatment, taking half doseof medication prescribed Importance of controlling blood pressurei discussed  Re eval in 4 to 6 weeks  Hepatic cirrhosis due to primary biliary cholangitis (HCC) Needs to follow up with GI  Stress due to illness of family member Pt encouraged to ventilate, faxcing the reaslity that her husband will need tobe in a SNF for a while if not indefinitely, and this is challenging. Children are very supportive   Encounter for immunization After obtaining informed consent, the influenza  vaccine is  administered , with no adverse effect noted at the time of administration.

## 2023-03-24 NOTE — Assessment & Plan Note (Signed)
Uncontrolled, non compliant with treatment, taking half doseof medication prescribed Importance of controlling blood pressurei discussed  Re eval in 4 to 6 weeks

## 2023-03-24 NOTE — Assessment & Plan Note (Signed)
Needs to follow up with GI.

## 2023-03-24 NOTE — Assessment & Plan Note (Signed)
Pt encouraged to ventilate, faxcing the reaslity that her husband will need tobe in a SNF for a while if not indefinitely, and this is challenging. Children are very supportive

## 2023-03-31 ENCOUNTER — Ambulatory Visit: Payer: Medicare Other

## 2023-03-31 ENCOUNTER — Ambulatory Visit: Payer: Self-pay | Admitting: Family Medicine

## 2023-03-31 NOTE — Telephone Encounter (Signed)
Copied from CRM [id number]. Topic: Clinical - Red Word Triage >> Mar 31, 2023  2:15 PM Prudencio Pair wrote: Red Word that prompted transfer to Nurse Triage: Patient states she leaned over her car console and she fractured her ribs. She states she is in pain and wants to know what should she do.   Chief Complaint: Rib pain Symptoms: pain and a smail "bulge" Frequency: x 3-4 days Pertinent Negatives: Patient denies falls or shortness of breath Disposition: [] ED /[x] Urgent Care (no appt availability in office) / [] Appointment(In office/virtual)/ []  [facility]/ [] Home Care/ [] Refused Recommended Disposition /[] [facility]/ []  Follow-up with PCP Additional Notes: Patient reports pain on right side, under breast area. States she was overreaching in her car. Patient states that she was feeling a little better but carried some heavy laundry today and started hurting again. Patient states that she missed her appt today. Next avail appt at [facility] is 12/11. RN advised patient to go to Urgent Care for closer eval, pt agreeable and states that she will go tomorrow morning.     Reason for Disposition  [1] Chest wall swelling or pain AND [2] present > 7 days  Answer Assessment - Initial Assessment Questions 1. MECHANISM: "How did the injury happen?"     Reaching over the armrest in the car.   2. ONSET: "When did the injury happen?" (Minutes or hours ago)     Happened over the weekend.   3. LOCATION: "Where on the chest is the injury located?"     Right side   4. APPEARANCE: "What does the injury look like?"     Does not appear to be bruised or swollen  5. BLEEDING: "Is there any bleeding now? If Yes, ask: How long has it been bleeding?"     No   6. SEVERITY: "Any difficulty with breathing?"     Pain with regular breaths, sts that its "takes her breath away"  7. SIZE: For cuts, bruises, or swelling, ask: "How large is it?" (e.g., inches or centimeters)     No cuts or bruising, can  feel a small bulge under right breast  8. PAIN: "Is there pain?" If Yes, ask: "How bad is the pain?"   (e.g., Scale 1-10; or mild, moderate, severe)     5/10 pain, it's a nagging pain  9. TETANUS: For any breaks in the skin, ask: "When was the last tetanus booster?"     Unsure  Protocols used: Chest Injury-A-AH

## 2023-04-15 ENCOUNTER — Ambulatory Visit: Payer: Self-pay | Admitting: Family Medicine

## 2023-05-27 ENCOUNTER — Ambulatory Visit: Payer: Self-pay | Admitting: Family Medicine

## 2023-06-19 ENCOUNTER — Telehealth: Payer: Self-pay

## 2023-06-19 NOTE — Telephone Encounter (Signed)
Copied from CRM [id number]. Topic: Clinical - Medical Advice >> Jun 19, 2023  2:18 PM Shelah Lewandowsky wrote: Reason for CRM: have flu symptoms and wants to speak with Dr. Lodema Hong, refused an appointment, only wants to speak with her doctor [phone]

## 2023-06-25 ENCOUNTER — Telehealth: Payer: Self-pay

## 2023-06-25 NOTE — Telephone Encounter (Signed)
 I have made 2 attempts to speak  with the patient and have also left her 2 messages as of 12:19 on  06/25/2023 Based on  symptoms reported in current message I recommend antiviral treatment , paxlovid , but she needs updated kidney function test for dosing

## 2023-06-25 NOTE — Telephone Encounter (Signed)
 Copied from CRM [id number]. Topic: Clinical - Medication Question >> Jun 24, 2023  4:00 PM [name] wrote: Reason for CRM: Patient called in stating she is needing something for Covid she tested positive and would like her provider to call something in for her, she's also requesting to speak with her provider only in regards to her testing positive. [phone]  Spoke with patient and she reports congestion, body aches, and dry cough. She denies fever/chills or other symptoms. She reports symptoms started about 2 weeks ago with cold symptoms that got progressively worse. She tested positive for COVID 2 days ago. She has been taking Coricidin with little improvement.   She would like to know what you recommend. Please advise, thank you!

## 2023-06-30 ENCOUNTER — Telehealth: Payer: Self-pay | Admitting: Family Medicine

## 2023-06-30 NOTE — Telephone Encounter (Signed)
 Copied from CRM [id number]. Topic: General - Other >> Jun 25, 2023  5:19 PM Kristie Cowman wrote: Reason for CRM: Patient called stating that she missed two calls from the office today because she was sleeping.  She has been taking Chloracedin for her symptoms, but would like the office to call her back tomorrow regarding getting Paxlovid.

## 2023-06-30 NOTE — Telephone Encounter (Signed)
 Called to see what symptoms the pt was having currently and no answer, Left voicemail

## 2023-07-07 ENCOUNTER — Ambulatory Visit (INDEPENDENT_AMBULATORY_CARE_PROVIDER_SITE_OTHER): Payer: Self-pay | Admitting: Family Medicine

## 2023-07-07 ENCOUNTER — Encounter: Payer: Self-pay | Admitting: Family Medicine

## 2023-07-07 VITALS — BP 130/77 | HR 83 | Ht 63.0 in | Wt 139.1 lb

## 2023-07-07 DIAGNOSIS — K743 Primary biliary cirrhosis: Secondary | ICD-10-CM | POA: Diagnosis not present

## 2023-07-07 DIAGNOSIS — G8929 Other chronic pain: Secondary | ICD-10-CM | POA: Diagnosis not present

## 2023-07-07 DIAGNOSIS — I10 Essential (primary) hypertension: Secondary | ICD-10-CM | POA: Diagnosis not present

## 2023-07-07 DIAGNOSIS — M5410 Radiculopathy, site unspecified: Secondary | ICD-10-CM

## 2023-07-07 DIAGNOSIS — M25561 Pain in right knee: Secondary | ICD-10-CM

## 2023-07-07 MED ORDER — URSODIOL 300 MG PO CAPS: 300.0000 mg | ORAL_CAPSULE | Freq: Three times a day (TID) | ORAL | 1 refills | Status: DC

## 2023-07-07 MED ORDER — LIDOCAINE 5 % EX PTCH: 1.0000 | MEDICATED_PATCH | CUTANEOUS | 1 refills | Status: AC

## 2023-07-07 NOTE — Progress Notes (Signed)
   Yolanda Foley     MRN: [id number]      DOB: 03-May-1940  Chief Complaint  Patient presents with   Follow-up    10-12 wk f/u  Rt leg pain    HPI Ms. Dudek is here for follow up and re-evaluation of chronic medical conditions, medication management and review of any available recent lab and radiology data.  Preventive health is updated, specifically  Cancer screening and Immunization.   Questions or concerns regarding consultations or procedures which the PT has had in the interim are  addressed. 4 year h/o LBP  radiaiting to ankles in 4 years,worse in past 2 weeks unable to weight bear, using a cane ROS Denies recent fever or chills. Denies sinus pressure, nasal congestion, ear pain or sore throat. Denies chest congestion, productive cough or wheezing. Denies chest pains, palpitations and leg swelling Denies abdominal pain, nausea, vomiting,diarrhea or constipation.   Denies dysuria, frequency, hesitancy or incontinence. . Denies skin break down or rash.   PE  BP 130/77   Pulse 83   Ht 5\' 3"  (1.6 m)   Wt 139 lb 1.3 oz (63.1 kg)   SpO2 96%   BMI 24.64 kg/m   Patient alert and oriented and in no cardiopulmonary distress.Pt in pain   HEENT: No facial asymmetry, EOMI,     Neck supple .  Chest: Clear to auscultation bilaterally.  CVS: S1, S2 no murmurs, no S3.Regular rate.  ABD: Soft non tender.   Ext: No edema  MS: decreased  ROM  lumbar spine, and hips and knees Skin: Intact, no ulcerations or rash noted.  Psych: Good eye contact, normal affect. not anxious CNS: CN 2-12 intact, power,  normal throughout.no focal deficits noted.   Assessment & Plan  Back pain with radiculopathy Increasingly debilitating in past several weeks, also needs to drive over 30 miles daily to visit her spouse who is currently in a SNF Based on co morbidities, short course of prednisone deemed best option due to pain level however patient and her daughter hesitant Gabapentin deemed too  potentially sedating esp since driving regularly Lidoderm patch prescribed  Essential hypertension Controlled, no change in medication   Hepatic cirrhosis due to primary biliary cholangitis (HCC) Needs to schedule appt with GI and is aware , 2 month script for ursodiol prescribed with this understanding

## 2023-07-07 NOTE — Patient Instructions (Addendum)
 F/U in 3 months,, call if you ned me before  New for knee and back pain is lidoderm patch  You are referred urgently to Orthopedics re knee and back pain  You need to schedule appointment with GI and keep it  Thanks for choosing [facility], we consider it a privelige to serve you.

## 2023-07-08 ENCOUNTER — Telehealth: Payer: Self-pay | Admitting: Pharmacy Technician

## 2023-07-08 ENCOUNTER — Other Ambulatory Visit (HOSPITAL_COMMUNITY): Payer: Self-pay

## 2023-07-08 NOTE — Telephone Encounter (Signed)
 Pharmacy Patient Advocate Encounter  Received notification from [employer] that Prior Authorization for LIDOCAINE 5% PATCH has been DENIED.  Full denial letter will be uploaded to the media tab. See denial reason below.   PA #/Case ID/Reference #: [id number]  OTC 4% PATCHES ARE AVAILABLE AS AN ALTERNATIVE.

## 2023-07-08 NOTE — Telephone Encounter (Signed)
 Pharmacy Patient Advocate Encounter   Received notification from Patient Pharmacy that prior authorization for LIDOCAINE 5% PATCH is required/requested.   Insurance verification completed.   The patient is insured through [employer] [facility] .   Per test claim: PA required; PA submitted to above mentioned insurance via CoverMyMeds Key/confirmation #/EOC [id number] Status is pending

## 2023-07-10 ENCOUNTER — Ambulatory Visit: Payer: Self-pay | Admitting: Family Medicine

## 2023-07-10 MED ORDER — PREDNISONE 5 MG PO TABS: 5.0000 mg | ORAL_TABLET | Freq: Two times a day (BID) | ORAL | 0 refills | Status: AC

## 2023-07-10 MED ORDER — MONTELUKAST SODIUM 10 MG PO TABS: 10.0000 mg | ORAL_TABLET | Freq: Every day | ORAL | 3 refills | Status: DC

## 2023-07-10 MED ORDER — LEVOCETIRIZINE DIHYDROCHLORIDE 5 MG PO TABS: 5.0000 mg | ORAL_TABLET | Freq: Every evening | ORAL | 2 refills | Status: DC

## 2023-07-10 NOTE — Telephone Encounter (Signed)
 Noted Prednisone is prescribed

## 2023-07-10 NOTE — Addendum Note (Signed)
 Addended by: Kerri Perches on: 07/10/2023 03:35 PM   Modules accepted: Orders

## 2023-07-10 NOTE — Telephone Encounter (Signed)
 Pt has agreed to the low dose of Prednisone, she stated that her daughter is making her an appt w/ Ortho. Pt states she will have her daughter  pick up both the Xyzal and the Prednisone later today. Thank you.

## 2023-07-10 NOTE — Telephone Encounter (Addendum)
 Copied from CRM [id number]. Topic: Clinical - Red Word Triage >> Jul 10, 2023 10:15 AM Marland Kitchen D wrote: Patient's daughter said the patient is really sick has the flu her symptoms have worsened   Chief Complaint: Persistent COVID symptoms  Symptoms: Cough, runny nose, body aches, urinary incontinence, right knee pain Frequency: Constant  Pertinent Negatives: Patient denies fever, shortness of breath  Disposition: [] ED /[] Urgent Care (no appt availability in office) / [x] Appointment(In office/virtual)/ []  [facility]/ [] Home Care/ [x] Refused Recommended Disposition /[] [facility]/ []  Follow-up with PCP Additional Notes: Patient and daughter called to report that the patient has been experiencing persistent COVID symptoms for the last few weeks. She states that her symptoms have improved but she is still experiencing body aches, cough, and runny nose. She states she has also had some difficulty walking due to the pain which has cause some episodes of incontinence. Patient states she is also experiencing right knee pain which is chronic but was not improved with her recent injection.   I discussed appointment availability and offered to schedule her at another office but she declined, stating that she would prefer for Dr. Lodema Hong or someone from the office call her to discuss medications she can take for her symptoms or if something could be prescribed.   Reason for Disposition  [1] PERSISTING SYMPTOMS OF COVID-19 AND [2] NO medical visit for COVID-19 in past 2 weeks  Answer Assessment - Initial Assessment Questions 1. COVID-19 ONSET: "When did the symptoms of COVID-19 first start?"     2-3 weeks ago  2. DIAGNOSIS CONFIRMATION: "How were you diagnosed?" (e.g., COVID-19 oral or nasal viral test; COVID-19 antibody test; doctor visit)     Nasal test  3. MAIN SYMPTOM:  "What is your main concern or symptom right now?" (e.g., breathing difficulty, cough, fatigue. loss of smell)      Body aches  5. BETTER-SAME-WORSE: "Are you getting better, staying the same, or getting worse over the last 1 to 2 weeks?"     Better  6. RECENT MEDICAL VISIT: "Have you been seen by a healthcare provider (doctor, NP, PA) for these persisting COVID-19 symptoms?" If Yes, ask: "When were you seen?" (e.g., date)     No 7. COUGH: "Do you have a cough?" If Yes, ask: "How bad is the cough?"       Yes, frequent  8. FEVER: "Do you have a fever?" If Yes, ask: "What is your temperature, how was it measured, and when did it start?"     No 9. BREATHING DIFFICULTY: "Are you having any trouble breathing?" If Yes, ask: "How bad is your breathing?" (e.g., mild, moderate, severe)    - MILD: No SOB at rest, mild SOB with walking, speaks normally in sentences, can lie down, no retractions, pulse < 100.    - MODERATE: SOB at rest, SOB with minimal exertion and prefers to sit, cannot lie down flat, speaks in phrases, mild retractions, audible wheezing, pulse 100-120.    - SEVERE: Very SOB at rest, speaks in single words, struggling to breathe, sitting hunched forward, retractions, pulse > 120.       No 10. OTHER SYMPTOMS: "Do you have any other symptoms?"  (e.g., fatigue, headache, muscle pain, weakness)       Right knee pain, cough, nasal congestion  11. HIGH RISK DISEASE: "Do you have any chronic medical problems?" (e.g., asthma, heart or lung disease, weak immune system, obesity, etc.)       No 12.  VACCINE: "Have you gotten the COVID-19 vaccine?" If Yes, ask: "Which one, how many shots, when did you get it?"       Yes, 4 shots  Protocols used: Coronavirus (COVID-19) Persisting Symptoms Follow-up Call-A-AH

## 2023-07-10 NOTE — Telephone Encounter (Signed)
 Pls advise for cough and runny nose I have proscribed xyzal, which is similar to zyrtec, and montelukast  For pain in the knee, u pain medications we discussed have side effects that Ms Ridgely was hesitant about, in particular I feel that 5 days of low dose prednisone 5mg  once daily will help all these symptoms , if she willl try that I will send it in also , let me know  The orthopedic doctor will directly work on her knee pain when she is able to see him

## 2023-07-10 NOTE — Addendum Note (Signed)
 Addended by: Kerri Perches on: 07/10/2023 05:17 PM   Modules accepted: Orders

## 2023-07-14 ENCOUNTER — Telehealth: Payer: Self-pay

## 2023-07-14 NOTE — Telephone Encounter (Signed)
 Patient called stating that her PCP prescribed her some prednisone for her right knee and wanted to know if she needed a cortisone would that be a conflict.  Stated that she has swelling in the right knee too.  Cb# [id number].  Please advise.  Thank you.

## 2023-07-15 ENCOUNTER — Encounter (INDEPENDENT_AMBULATORY_CARE_PROVIDER_SITE_OTHER): Payer: Self-pay | Admitting: *Deleted

## 2023-07-15 ENCOUNTER — Ambulatory Visit: Admitting: Orthopaedic Surgery

## 2023-07-15 ENCOUNTER — Encounter: Payer: Self-pay | Admitting: Family Medicine

## 2023-07-15 DIAGNOSIS — M5410 Radiculopathy, site unspecified: Secondary | ICD-10-CM | POA: Insufficient documentation

## 2023-07-15 DIAGNOSIS — G8929 Other chronic pain: Secondary | ICD-10-CM | POA: Insufficient documentation

## 2023-07-15 NOTE — Telephone Encounter (Signed)
 No conflict , needs to get orthopedic care with injection, please let her know

## 2023-07-15 NOTE — Assessment & Plan Note (Signed)
 Increasingly debilitating in past several weeks, also needs to drive over 30 miles daily to visit her spouse who is currently in a SNF Based on co morbidities, short course of prednisone deemed best option due to pain level however patient and her daughter hesitant Gabapentin deemed too potentially sedating esp since driving regularly Lidoderm patch prescribed

## 2023-07-15 NOTE — Assessment & Plan Note (Signed)
 Needs to schedule appt with GI and is aware , 2 month script for ursodiol prescribed with this understanding

## 2023-07-15 NOTE — Assessment & Plan Note (Signed)
 Controlled, no change in medication

## 2023-07-24 ENCOUNTER — Ambulatory Visit: Payer: Self-pay | Admitting: Family Medicine

## 2023-07-24 NOTE — Telephone Encounter (Signed)
 Right knee swelling and pain x1 month  Symptoms: Shortness of breath with movement (started 1 week ago), intermittent pain in knee (9/10 pain) Pertinent Negatives: Patient denies fever, chest pain  Disposition: [x] Urgent Care (no appt availability in office)  Additional Notes: Pt saw Dr. Lodema Hong on 3/11 for right knee swelling and was told she has arthritis.  Pt was put on prednisone but the symptoms did not go away. This RN advised pt to go to urgent care based off SOB. This RN educated pt on home care, new-worsening symptoms, when to call back/seek emergent care. Pt verbalized understanding and agrees to plan.   Copied from CRM [id number]. Topic: General - Other >> Jul 24, 2023  4:10 PM Emylou G wrote: Reason for CRM: knees are swelling and it's painful, can hardly walk, and effecting her breathing.. was put on prednisone and finished it.. wants to know if she needs to repeat it?  she feels like it didn't work.. doesn't know what to do Reason for Disposition  [1] MILD difficulty breathing (e.g., minimal/no SOB at rest, SOB with walking, pulse <100) AND [2] NEW-onset or WORSE than normal  Answer Assessment - Initial Assessment Questions Right knee swelling and pain x1 month  Symptoms: Shortness of breath with movement (started 1 week ago), intermittent pain in knee (9/10 pain) Pertinent Negatives: Patient denies fever, chest pain  Protocols used: Breathing Difficulty-A-AH

## 2023-07-24 NOTE — Telephone Encounter (Signed)
 Is this for a gel injection?

## 2023-08-07 NOTE — Telephone Encounter (Signed)
 No, Dr Lodema Hong did not order any injection. She told the pt that IF she was requesting a cortisone shot that she needed to see orthopedics for their recommended treatment plan

## 2023-08-21 ENCOUNTER — Ambulatory Visit: Payer: Self-pay | Admitting: Family Medicine

## 2023-08-21 ENCOUNTER — Ambulatory Visit: Payer: Self-pay

## 2023-08-21 NOTE — Telephone Encounter (Signed)
  Chief Complaint: abdominal pain Symptoms: upper abdominal pain, constipation, dizziness Frequency: since last Mon/Tues Pertinent Negatives: Patient denies nausea and vomiting  Disposition: [] ED /[] Urgent Care (no appt availability in office) / [x] Appointment(In office/virtual)/ []  [facility]/ [] Home Care/ [] Refused Recommended Disposition /[] [facility]/ []  Follow-up with PCP Additional Notes: pt states that she hasn't had BM since last Mon/Tues, she has only tried increasing water and taking extra Vit C (which normally works for her). Recommended pt be seen d/t c/o dizziness and abdominal pain. Scheduled OV today at 220.   Copied from CRM [id number]. Topic: Clinical - Red Word Triage >> Aug 21, 2023 11:45 AM Everlene Hobby D wrote: Patient says she hasn't been able to poop in about a week and that it hurts under her rib cage And she says she is dizzy and feels bad Reason for Disposition  [1] MILD-MODERATE pain AND [2] constant AND [3] present > 2 hours  Answer Assessment - Initial Assessment Questions 1. LOCATION: "Where does it hurt?"      Under rib cage  3. ONSET: "When did the pain begin?" (e.g., minutes, hours or days ago)      Last week Monday/ Tuesday  5. PATTERN "Does the pain come and go, or is it constant?"    - If it comes and goes: "How long does it last?" "Do you have pain now?"     (Note: Comes and goes means the pain is intermittent. It goes away completely between bouts.)    - If constant: "Is it getting better, staying the same, or getting worse?"      (Note: Constant means the pain never goes away completely; most serious pain is constant and gets worse.)      Comes and goes  6. SEVERITY: "How bad is the pain?"  (e.g., Scale 1-10; mild, moderate, or severe)    - MILD (1-3): Doesn't interfere with normal activities, abdomen soft and not tender to touch..     - MODERATE (4-7): Interferes with normal activities or awakens from sleep, abdomen tender to touch.      - SEVERE (8-10): Excruciating pain, doubled over, unable to do any normal activities.       7/10 8. AGGRAVATING FACTORS: "Does anything seem to cause this pain?" (e.g., foods, stress, alcohol)     Urinating make it worse  10. OTHER SYMPTOMS: "Do you have any other symptoms?" (e.g., back pain, diarrhea, fever, urination pain, vomiting)       Dizziness, and feeling bad  Protocols used: Abdominal Pain - Upper-A-AH

## 2023-08-25 ENCOUNTER — Ambulatory Visit

## 2023-08-25 ENCOUNTER — Ambulatory Visit (INDEPENDENT_AMBULATORY_CARE_PROVIDER_SITE_OTHER): Admitting: Gastroenterology

## 2023-08-25 ENCOUNTER — Encounter (INDEPENDENT_AMBULATORY_CARE_PROVIDER_SITE_OTHER): Payer: Self-pay | Admitting: Gastroenterology

## 2023-08-25 ENCOUNTER — Telehealth (INDEPENDENT_AMBULATORY_CARE_PROVIDER_SITE_OTHER): Payer: Self-pay | Admitting: Gastroenterology

## 2023-08-25 VITALS — BP 139/74 | HR 90 | Ht 63.0 in | Wt 140.1 lb

## 2023-08-25 VITALS — BP 146/83 | HR 67 | Temp 97.8°F | Ht 63.0 in | Wt 140.0 lb

## 2023-08-25 DIAGNOSIS — K7469 Other cirrhosis of liver: Secondary | ICD-10-CM

## 2023-08-25 DIAGNOSIS — D649 Anemia, unspecified: Secondary | ICD-10-CM | POA: Diagnosis not present

## 2023-08-25 DIAGNOSIS — K743 Primary biliary cirrhosis: Secondary | ICD-10-CM

## 2023-08-25 DIAGNOSIS — M1711 Unilateral primary osteoarthritis, right knee: Secondary | ICD-10-CM | POA: Diagnosis not present

## 2023-08-25 DIAGNOSIS — R5383 Other fatigue: Secondary | ICD-10-CM

## 2023-08-25 MED ORDER — MELOXICAM 7.5 MG PO TABS: 7.5000 mg | ORAL_TABLET | Freq: Every day | ORAL | 0 refills | Status: DC

## 2023-08-25 NOTE — Patient Instructions (Signed)
 Please continue your ursodiol  300mg , take this three times per day We will update labs and US  in regards to your liver disease As discussed, I am recommending an upper endoscopy to evaluate for enlarged blood vessels in your esophagus that can bleed and be life threatening, please let me know if you change your mind about doing this. If you have any rectal bleeding, black stools, vomiting of blood or coffee ground like substance you need to proceed to the ER immediately  Follow up 6 months

## 2023-08-25 NOTE — Progress Notes (Unsigned)
   Acute Office Visit  Subjective:     Patient ID: Yolanda Foley, female    DOB: 10/13/1940, 83 y.o.   MRN: [id number]  Chief Complaint  Patient presents with   Medical Management of Chronic Issues    Pt states abdominal Pain that comes and goes along with gas and bloat     HPI Patient is in today for abdominal pain  ROS      Objective:    BP 139/74   Pulse 90   Ht 5\' 3"  (1.6 m)   Wt 140 lb 1.3 oz (63.5 kg)   SpO2 93%   BMI 24.81 kg/m  {Vitals History (Optional):23777}  Physical Exam  No results found for any visits on 08/25/23.      Assessment & Plan:   Problem List Items Addressed This Visit   None   No orders of the defined types were placed in this encounter.   No follow-ups on file.  Alison Irvine, FNP

## 2023-08-25 NOTE — Progress Notes (Unsigned)
 Referring Provider: Towanda Fret, MD Primary Care Physician:  Towanda Fret, MD Primary GI Physician: Dr. 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 Patient says she thinks the PBC.She is taking ursodiol  300 mg bid.    HPI:   Yolanda Foley is a 83 y.o. female with past medical history of PBC cirrhosis, HTN, HLD, GERD, Cholelithiasis, chronic anemia, depression and chronic pain   Patient presenting today for:  Follow up of PBC, CIrrhosis  Last seen may 2024, at that time, having some abdominal pain, onset that morning. Taking uros once daily and milk thistle. Urso  makes her dizzy in higher doses. Could not complete previously ordered EGD due to no transportation   Recommended to update CBC, AFP, MELD labs, Liver US  due June 2024, EGD, Urso  300mg  TID  EGD not completed, labs in November with AFP 7.8, alk phos 225, plt 79k, advised to have repeat CBC which was not completed, also recommended to schedule EGD again, which was also not completed    Present: States she had covid in February and symptoms lasted into march, she has a lot of knee pain, and using a cane currently secondary to covid infection. States that she is feeling good otherwise. She does not want to proceed with EGD at this time as she is caring for her husband. Denies any swelling to her abdomen. Occasional swelling to her legs if she does not sleep well at night. Denies episodes of confusion. Denies jaundice or pruritus. States she is taking Urso  300mg  BID right now but has plan to go back to TID if needed. No rectal bleeding or melena. Denies nausea or vomiting. Inquires about her disease and if she still has PBC/cirrhosis. States she does not understand how she got cirrhosis since she does not drink, does not want to do EGD as she "feels good" and does not think she needs this.  MELD 3.0  in November 2024 was  7  Cirrhosis related questions:  Hematemesis/coffee ground emesis: No History of variceal bleeding: No Abdominal pain: No Abdominal distention/worsening ascitesNo Fever/chills: No Episodes of confusion/disorientation: No Taking diuretics?: No Date of last EGD: Never Prior history of banding?: No Prior episodes of SBP: No Last time liver imaging was performed: 08/2022 Cholelithiasis without secondary signs to suggest acute  cholecystitis.  Morphologic changes to the liver suggestive of cirrhosis. No  focal hepatic lesion identified.    Last Colonoscopy:(07/16/09) internal and external hemorrhoids  Last EGD: never   Filed Weights   08/25/23 1539  Weight: 140 lb (63.5 kg)     Past Medical History:  Diagnosis Date   Cholelithiasis    Chronic pain    Depression    Elevated liver enzymes    GERD (gastroesophageal reflux disease)    Hyperlipidemia    Hypertension    Nonspecific elevation of levels of transaminase or lactic acid dehydrogenase (LDH)    Palpitation    Primary biliary cirrhosis (HCC)     Past Surgical History:  Procedure Laterality Date   correction of nasal surgery  35 years ago    Left ovarian cyst removal  1974   ROTATOR CUFF REPAIR Left 08/2016   TONSILLECTOMY  1967     Current Outpatient Medications  Medication Sig Dispense Refill   lidocaine  (LIDODERM ) 5 % Place 1 patch onto the skin daily. Remove & Discard patch within 12  hours or as directed by MD 30 patch 1   Methylsulfonylmethane (MSM) 500 MG CAPS Take 1 capsule by mouth daily.     olmesartan  (BENICAR ) 40 MG tablet TAKE 1 TABLET BY MOUTH ONCE DAILY 30 tablet 5   OVER THE COUNTER MEDICATION K 2 daily  Vit D 3 once daily.  Omega fish oil daily.     ursodiol  (ACTIGALL ) 300 MG capsule Take 1 capsule (300 mg total) by mouth 3 (three) times daily. 60 capsule 1   vitamin C (ASCORBIC ACID) 500 MG tablet Take 500 mg by mouth daily.      meloxicam (MOBIC) 7.5 MG tablet Take 1 tablet (7.5 mg total) by  mouth daily. (Patient not taking: Reported on 08/25/2023) 30 tablet 0   No current facility-administered medications for this visit.    Allergies as of 08/25/2023 - Review Complete 08/25/2023  Allergen Reaction Noted   Fish allergy Nausea And Vomiting 11/20/2011   Fluoxetine  Other (See Comments) 12/23/2012   Other Cough 12/20/2020   Oxycodone Nausea And Vomiting 12/10/2016   Sulfonamide derivatives Hives    Coreg  [carvedilol ] Palpitations 12/25/2022    Social History   Socioeconomic History   Marital status: Married    Spouse name: Not on file   Number of children: 4   Years of education: 16   Highest education level: Associate degree: academic program  Occupational History   Occupation: retired   Tobacco Use   Smoking status: Never   Smokeless tobacco: Never  Vaping Use   Vaping status: Never Used  Substance and Sexual Activity   Alcohol use: No   Drug use: No   Sexual activity: Not Currently  Other Topics Concern   Not on file  Social History Narrative   Expressed to this nurse her frustration with her husband his controlling ways.   Social Drivers of Corporate investment banker Strain: Low Risk  (12/10/2022)   Overall Financial Resource Strain (CARDIA)    Difficulty of Paying Living Expenses: Not very hard  Food Insecurity: No Food Insecurity (12/10/2022)   Hunger Vital Sign    Worried About Running Out of Food in the Last Year: Never true    Ran Out of Food in the Last Year: Never true  Transportation Needs: No Transportation Needs (12/10/2022)   PRAPARE - Administrator, Civil Service (Medical): No    Lack of Transportation (Non-Medical): No  Physical Activity: Insufficiently Active (12/10/2022)   Exercise Vital Sign    Days of Exercise per Week: 3 days    Minutes of Exercise per Session: 30 min  Stress: No Stress Concern Present (12/10/2022)   [employer] of Occupational Health - Occupational Stress Questionnaire    Feeling of Stress : Only a  little  Social Connections: Socially Integrated (12/10/2022)   Social Connection and Isolation Panel [NHANES]    Frequency of Communication with Friends and Family: More than three times a week    Frequency of Social Gatherings with Friends and Family: More than three times a week    Attends Religious Services: More than 4 times per year    Active Member of [employer] or Organizations: Yes    Attends Engineer, structural: More than 4 times per year    Marital Status: Married    Review of systems General: negative for malaise, night sweats, fever, chills, weight loss Neck: Negative for lumps, goiter, pain and significant neck swelling Resp: Negative for cough, wheezing, dyspnea at rest CV: Negative for  chest pain, leg swelling, palpitations, orthopnea GI: denies melena, hematochezia, nausea, vomiting, diarrhea, constipation, dysphagia, odyonophagia, early satiety or unintentional weight loss.  MSK: endorses knee pain  Derm: Negative for itching or rash Neuro: negative for tremor, gait imbalance, syncope and seizures. The remainder of the review of systems is noncontributory.  Physical Exam: BP (!) 146/83 (BP Location: Left Arm, Patient Position: Sitting, Cuff Size: Normal)   Pulse 67   Temp 97.8 F (36.6 C) (Temporal)   Ht 5\' 3"  (1.6 m)   Wt 140 lb (63.5 kg)   BMI 24.80 kg/m  General:   Alert and oriented. No distress noted. Pleasant and cooperative.  Head:  Normocephalic and atraumatic. Eyes:  Conjuctiva clear without scleral icterus. Mouth:  Oral mucosa pink and moist. Good dentition. No lesions. Heart: Normal rate and rhythm, s1 and s2 heart sounds present.  Lungs: Clear lung sounds in all lobes. Respirations equal and unlabored. Abdomen:  +BS, soft, non-tender and non-distended. No rebound or guarding. No HSM or masses noted. Derm: No palmar erythema or jaundice Msk:  Symmetrical without gross deformities. Normal posture. Extremities:  Without edema. Neurologic:  Alert  and  oriented x4 Psych:  Alert and cooperative. Normal mood and affect.  Invalid input(s): "6 MONTHS"   ASSESSMENT: ASHYRA BEMBRY is a 83 y.o. female presenting today for follow up of PBC with cirrhosis   Patient with history of PBC and cirrhosis thought secondary to her disease who has remained well compensated on ursodiol  therapy. She has had some issues with non compliance with her Urso  and routine cirrhosis care. Last MELD 3.0 in November was 7, T bili at that time 0.3 with Alk phos 225, though she was only taking her Urso  daily at that time, she was encouraged to take Urso  as prescribed in order to optimize results. Currently taking Urso  BID with plans to resume TID dosing per the patient. As she has presented in the past with thrombocytopenia, she has been recommended to schedule EGD multiple times though has not done this. Today she tells me she does not think she needs this. She seems confused about her liver disease despite being diagnosed with this many years back.  Thorough discussion with the patient and her daughter present at the visit regarding etiology of her cirrhosis/PBC and what these diseases entail. We discussed  indications for EGD for EV screening, given her cirrhosis and thrombocytopenia as this is the only way to screen for presence of EVs which can be life threatening if bleeding occurs from them. Patient continued to state she felt fine and did not feel the need for EGD. I reiterated multiple times during the visit, the importance of EV screening as EV bleeding can be life threatening. At this time, patient continues to decline to undergo EGD. We discussed importance of routine follow ups in the clinic with labs and US  of the liver due to increased risk for [facility]. We discussed monitoring for swelling, especially of the abdomen, jaundice, pruritus, fatigue, for which she has not experienced any of these thus far, reassuringly. At this time, I recommended she take her Urso  at  prescribed dosing of 300mg  TID as she has been non compliant with proper dosing which is likely contributing to ongoing elevation of her alkaline phosphatase and non optimized medical management over her PBC.   PLAN:  -MELD labs, CBC, AFP (having CBC and CMP done with PCP, will review these to calculate MELD/evaluate AP/T bili) -RUQ US  for hcc screening  -EGD for  EV screening-pt declined, will let me know if she changes her mind  -continue Urso  300mg , needs to take this TID  -Reduce salt intake to <2 g per day - Can take Tylenol max of 2 g per day (650 mg q8h) for pain - Avoid NSAIDs for pain - Avoid eating raw oysters/shellfish - Ensure every night before going to sleep -pt made aware of ED precautions for any type of GI bleeding to include hematochezia, CGE, melena, hematemesis, as in setting of unknown EV status, this could be a medical emergency.    All questions were answered, patient verbalized understanding and is in agreement with plan as outlined above.    Follow Up: 6 months   Yolanda Fagin L. Adrien Alberta, MSN, APRN, AGNP-C Adult-Gerontology Nurse Practitioner [facility]  I have reviewed the note and agree with the APP's assessment as described in this progress note  Samantha Cress, MD Gastroenterology and Hepatology [facility] Gastroenterology

## 2023-08-25 NOTE — Telephone Encounter (Signed)
 Pt would like US  scheduled with [facility] in [place]. Pt states "I dont have anything scheduled with Cristine Done." Order placed on provider desk. Will fax once order signed.

## 2023-08-26 ENCOUNTER — Encounter (INDEPENDENT_AMBULATORY_CARE_PROVIDER_SITE_OTHER): Payer: Self-pay

## 2023-08-26 LAB — PROTIME-INR
INR: 1 (ref 0.9–1.2)
Prothrombin Time: 11.3 s (ref 9.1–12.0)

## 2023-08-26 LAB — CMP14+EGFR
ALT: 15 IU/L (ref 0–32)
AST: 28 IU/L (ref 0–40)
Albumin: 4.3 g/dL (ref 3.7–4.7)
Alkaline Phosphatase: 136 IU/L — ABNORMAL HIGH (ref 44–121)
BUN/Creatinine Ratio: 16 (ref 12–28)
BUN: 13 mg/dL (ref 8–27)
Bilirubin Total: 0.3 mg/dL (ref 0.0–1.2)
CO2: 24 mmol/L (ref 20–29)
Calcium: 9.6 mg/dL (ref 8.7–10.3)
Chloride: 103 mmol/L (ref 96–106)
Creatinine, Ser: 0.83 mg/dL (ref 0.57–1.00)
Globulin, Total: 3.7 g/dL (ref 1.5–4.5)
Glucose: 93 mg/dL (ref 70–99)
Potassium: 4.7 mmol/L (ref 3.5–5.2)
Sodium: 141 mmol/L (ref 134–144)
Total Protein: 8 g/dL (ref 6.0–8.5)
eGFR: 69 mL/min/{1.73_m2} (ref >59)

## 2023-08-26 LAB — CBC WITH DIFFERENTIAL/PLATELET
Basophils Absolute: 0 10*3/uL (ref 0.0–0.2)
Basos: 0 %
EOS (ABSOLUTE): 0.1 10*3/uL (ref 0.0–0.4)
Eos: 3 %
Hematocrit: 31 % — ABNORMAL LOW (ref 34.0–46.6)
Hemoglobin: 9.9 g/dL — ABNORMAL LOW (ref 11.1–15.9)
Immature Grans (Abs): 0 10*3/uL (ref 0.0–0.1)
Immature Granulocytes: 1 %
Lymphocytes Absolute: 1 10*3/uL (ref 0.7–3.1)
Lymphs: 24 %
MCH: 28.5 pg (ref 26.6–33.0)
MCHC: 31.9 g/dL (ref 31.5–35.7)
MCV: 89 fL (ref 79–97)
Monocytes Absolute: 0.3 10*3/uL (ref 0.1–0.9)
Monocytes: 7 %
Neutrophils Absolute: 2.8 10*3/uL (ref 1.4–7.0)
Neutrophils: 65 %
Platelets: 133 10*3/uL — ABNORMAL LOW (ref 150–450)
RBC: 3.47 x10E6/uL — ABNORMAL LOW (ref 3.77–5.28)
RDW: 13.9 % (ref 11.7–15.4)
WBC: 4.2 10*3/uL (ref 3.4–10.8)

## 2023-08-26 LAB — SEDIMENTATION RATE: Sed Rate: 71 mm/h — ABNORMAL HIGH (ref 0–40)

## 2023-08-26 LAB — AFP TUMOR MARKER: AFP, Serum, Tumor Marker: 6.9 ng/mL (ref 0.0–8.7)

## 2023-08-26 NOTE — Telephone Encounter (Signed)
Order faxed to [facility]

## 2023-08-26 NOTE — Assessment & Plan Note (Signed)
 She has an appointment with GI later today.  Will check labs to reevaluate liver function.

## 2023-08-26 NOTE — Assessment & Plan Note (Signed)
 Unremarkable exam and his vital signs were normal.  Will check labs for further evaluation of her reported fatigue

## 2023-08-26 NOTE — Assessment & Plan Note (Signed)
 Recommend using topical anti-inflammatory like Voltaren gel for pain relief.  Also recommend follow-up with Ortho for further evaluation and treatment.

## 2023-08-27 NOTE — Telephone Encounter (Signed)
 Can you please call patient and schedule her for a cortisone injection and the [place] office?  Patient was a [facility] patient. Thank you

## 2023-08-31 ENCOUNTER — Other Ambulatory Visit: Payer: Self-pay

## 2023-08-31 DIAGNOSIS — R5383 Other fatigue: Secondary | ICD-10-CM

## 2023-08-31 DIAGNOSIS — D649 Anemia, unspecified: Secondary | ICD-10-CM

## 2023-08-31 NOTE — Telephone Encounter (Signed)
 Labs added. Pt informed. States she has an appt with ortho on 09/02/2023. Encouraged pt to keep appt, pt agreeable and verbalized understanding.

## 2023-09-02 ENCOUNTER — Encounter: Admitting: Surgical

## 2023-09-03 ENCOUNTER — Other Ambulatory Visit (INDEPENDENT_AMBULATORY_CARE_PROVIDER_SITE_OTHER): Payer: Self-pay

## 2023-09-03 DIAGNOSIS — D649 Anemia, unspecified: Secondary | ICD-10-CM

## 2023-09-03 DIAGNOSIS — I10 Essential (primary) hypertension: Secondary | ICD-10-CM

## 2023-09-03 DIAGNOSIS — K743 Primary biliary cirrhosis: Secondary | ICD-10-CM

## 2023-09-03 DIAGNOSIS — K8070 Calculus of gallbladder and bile duct without cholecystitis without obstruction: Secondary | ICD-10-CM

## 2023-09-04 ENCOUNTER — Encounter: Payer: Self-pay | Admitting: Family Medicine

## 2023-09-04 LAB — FERRITIN: Ferritin: 805 ng/mL — ABNORMAL HIGH (ref 15–150)

## 2023-09-04 LAB — IRON: Iron: 71 ug/dL (ref 27–139)

## 2023-09-04 LAB — VITAMIN B12: Vitamin B-12: 715 pg/mL (ref 232–1245)

## 2023-09-04 LAB — SPECIMEN STATUS REPORT

## 2023-09-04 LAB — FOLATE: Folate: 11 ng/mL (ref >3.0)

## 2023-10-06 ENCOUNTER — Ambulatory Visit: Admitting: Family Medicine

## 2023-12-14 ENCOUNTER — Ambulatory Visit: Payer: Medicare Other

## 2023-12-14 VITALS — BP 124/80 | Ht 63.0 in | Wt 138.0 lb

## 2023-12-14 DIAGNOSIS — Z780 Asymptomatic menopausal state: Secondary | ICD-10-CM

## 2023-12-14 DIAGNOSIS — Z0000 Encounter for general adult medical examination without abnormal findings: Secondary | ICD-10-CM

## 2023-12-14 DIAGNOSIS — Z1231 Encounter for screening mammogram for malignant neoplasm of breast: Secondary | ICD-10-CM

## 2023-12-14 NOTE — Patient Instructions (Signed)
 Yolanda Foley , Thank you for taking time out of your busy schedule to complete your Annual Wellness Visit with me. I enjoyed our conversation and look forward to speaking with you again next year. I, as well as your care team,  appreciate your ongoing commitment to your health goals. Please review the following plan we discussed and let me know if I can assist you in the future. Your Game plan/ To Do List    Referrals: If you haven't heard from the office you've been referred to, please reach out to them at the phone provided.  Osteoporosis Screening/Yearly Mammogram: Please call the number below to schedule your appt. [facility] Phone: [phone]  There are several Eye Doctors in your area. Here are a few that usually accept all insurance types:  [facility] [address] [place], [place] [address] Phone: [phone]  [facility] [address] [place], [place] [address] Phone: [phone]  [facility] [address] [place], [place] [address] Phone: [phone]  [facility] [address] [place], [place] [address] Phone: [phone]  [facility] [address] [place], [place] [address] Phone: [phone]  Please let us  know if you require a referral for an eye exam appointment. Thank you!  Follow up Visits: We will see or speak with you next year for your Next Medicare AWV with our clinical staff  Clinician Recommendations:  Aim for 30 minutes of exercise or brisk walking, 6-8 glasses of water, and 5 servings of fruits and vegetables each day.    Wishing you many blessings and good health during the next year until our next visit.  -Ajax Schroll   This is a list of the screenings recommended for you:  Health Maintenance  Topic Date Due   Zoster (Shingles) Vaccine (1 of 2) Never done   DEXA scan (bone density measurement)  12/22/2021   COVID-19 Vaccine (5 - 2024-25 season) 12/28/2022   Mammogram   04/11/2023   Flu Shot  11/27/2023   DTaP/Tdap/Td vaccine (2 - Tdap) 07/06/2024*   Medicare Annual Wellness Visit  12/13/2024   Pneumococcal Vaccine for age over 61  Completed   HPV Vaccine  Aged Out   Meningitis B Vaccine  Aged Out   Pneumococcal Vaccine  Discontinued  *Topic was postponed. The date shown is not the original due date.    Advanced directives: (Declined) Advance directive discussed with you today. Even though you declined this today, please call our office should you change your mind, and we can give you the proper paperwork for you to fill out. Advance Care Planning is important because it:  [x]  Makes sure you receive the medical care that is consistent with your values, goals, and preferences  [x]  It provides guidance to your family and loved ones and reduces their decisional burden about whether or not they are making the right decisions based on your wishes.  Follow the link provided in your after visit summary or read over the paperwork we have mailed to you to help you started getting your Advance Directives in place. If you need assistance in completing these, please reach out to us  so that we can help you!  See attachments for Preventive Care and Fall Prevention Tips.

## 2023-12-14 NOTE — Progress Notes (Signed)
 Subjective:   Yolanda Foley is a 83 y.o. who presents for a Medicare Wellness preventive visit.  As a reminder, Annual Wellness Visits don't include a physical exam, and some assessments may be limited, especially if this visit is performed virtually. We may recommend an in-person follow-up visit with your provider if needed.  Visit Complete: Virtual I connected with  Yolanda Foley on 12/14/23 by a audio enabled telemedicine application and verified that I am speaking with the correct person using two identifiers.  Patient Location: Home  Provider Location: Home Office  I discussed the limitations of evaluation and management by telemedicine. The patient expressed understanding and agreed to proceed.  Vital Signs: Because this visit was a virtual/telehealth visit, some criteria may be missing or patient reported. Any vitals not documented were not able to be obtained and vitals that have been documented are patient reported.  VideoDeclined- This patient declined Librarian, academic. Therefore the visit was completed with audio only.  Persons Participating in Visit: Patient.  AWV Questionnaire: No: Patient Medicare AWV questionnaire was not completed prior to this visit.  Cardiac Risk Factors include: advanced age (>73men, >22 women);dyslipidemia;hypertension;sedentary lifestyle     Objective:    Today's Vitals   12/14/23 1308 12/14/23 1309  BP: 124/80   Weight: 138 lb (62.6 kg)   Height: 5' 3 (1.6 m)   PainSc:  4    Body mass index is 24.45 kg/m.     12/14/2023   12:59 PM 12/10/2022    1:39 PM 02/28/2020   11:09 AM 02/25/2019   10:23 AM 02/18/2018    2:11 PM 06/05/2016   11:22 AM  Advanced Directives  Does Patient Have a Medical Advance Directive? No No No No No  No   Would patient like information on creating a medical advance directive? No - Patient declined No - Patient declined No - Patient declined  Yes (ED - Information included in AVS)   Yes (MAU/Ambulatory/Procedural Areas - Information given)      Data saved with a previous flowsheet row definition    Current Medications (verified) Outpatient Encounter Medications as of 12/14/2023  Medication Sig   lidocaine  (LIDODERM ) 5 % Place 1 patch onto the skin daily. Remove & Discard patch within 12 hours or as directed by MD   Methylsulfonylmethane (MSM) 500 MG CAPS Take 1 capsule by mouth daily.   olmesartan  (BENICAR ) 40 MG tablet TAKE 1 TABLET BY MOUTH ONCE DAILY   OVER THE COUNTER MEDICATION K 2 daily  Vit D 3 once daily.  Omega fish oil daily.   ursodiol  (ACTIGALL ) 300 MG capsule Take 1 capsule (300 mg total) by mouth 3 (three) times daily.   vitamin C (ASCORBIC ACID) 500 MG tablet Take 500 mg by mouth daily.    No facility-administered encounter medications on file as of 12/14/2023.    Allergies (verified) Fish allergy, Fluoxetine , Other, Oxycodone, Sulfonamide derivatives, and Coreg  [carvedilol ]   History: Past Medical History:  Diagnosis Date   Cholelithiasis    Chronic pain    Depression    Elevated liver enzymes    GERD (gastroesophageal reflux disease)    Hyperlipidemia    Hypertension    Nonspecific elevation of levels of transaminase or lactic acid dehydrogenase (LDH)    Palpitation    Primary biliary cirrhosis (HCC)    Past Surgical History:  Procedure Laterality Date   correction of nasal surgery  35 years ago    Left ovarian cyst removal  1974   ROTATOR CUFF REPAIR Left 08/2016   TONSILLECTOMY  1967    Family History  Problem Relation Age of Onset   Dementia Mother    Heart failure Father    Hypertension Father    Cancer Brother 86       kidney   Cancer Maternal Aunt        breast   Dementia Maternal Aunt    Social History   Socioeconomic History   Marital status: Married    Spouse name: Not on file   Number of children: 4   Years of education: 16   Highest education level: Associate degree: academic program  Occupational History    Occupation: retired   Tobacco Use   Smoking status: Never   Smokeless tobacco: Never  Vaping Use   Vaping status: Never Used  Substance and Sexual Activity   Alcohol use: No   Drug use: No   Sexual activity: Not Currently  Other Topics Concern   Not on file  Social History Narrative   Expressed to this nurse her frustration with her husband his controlling ways.   Social Drivers of Corporate investment banker Strain: Low Risk  (12/14/2023)   Overall Financial Resource Strain (CARDIA)    Difficulty of Paying Living Expenses: Not hard at all  Food Insecurity: No Food Insecurity (12/14/2023)   Hunger Vital Sign    Worried About Running Out of Food in the Last Year: Never true    Ran Out of Food in the Last Year: Never true  Transportation Needs: No Transportation Needs (12/14/2023)   PRAPARE - Administrator, Civil Service (Medical): No    Lack of Transportation (Non-Medical): No  Physical Activity: Inactive (12/14/2023)   Exercise Vital Sign    Days of Exercise per Week: 0 days    Minutes of Exercise per Session: 0 min  Stress: No Stress Concern Present (12/14/2023)   [employer] of Occupational Health - Occupational Stress Questionnaire    Feeling of Stress: Only a little  Social Connections: Moderately Isolated (12/14/2023)   Social Connection and Isolation Panel    Frequency of Communication with Friends and Family: More than three times a week    Frequency of Social Gatherings with Friends and Family: More than three times a week    Attends Religious Services: Never    Database administrator or Organizations: No    Attends Engineer, structural: Never    Marital Status: Married    Tobacco Counseling Counseling given: Yes    Clinical Intake:  Pre-visit preparation completed: Yes  Pain : 0-10 Pain Score: 4  Pain Type: Chronic pain Pain Location: Leg Pain Orientation: Right, Left Pain Descriptors / Indicators: Constant Pain Frequency:  Constant     BMI - recorded: 24.45 Nutritional Status: BMI of 19-24  Normal Nutritional Risks: None Diabetes: No  Lab Results  Component Value Date   HGBA1C 5.6 08/24/2017   HGBA1C 5.5 09/17/2016   HGBA1C 5.9 (H) 01/10/2015     How often do you need to have someone help you when you read instructions, pamphlets, or other written materials from your doctor or pharmacy?: 1 - Never  Interpreter Needed?: No  Information entered by :: [facility]fano [facility]   Activities of Daily Living     12/14/2023    1:18 PM  In your present state of health, do you have any difficulty performing the following activities:  Hearing? 1  Comment hearing aids  Vision? 0  Difficulty concentrating or making decisions? 0  Walking or climbing stairs? 0  Dressing or bathing? 0  Doing errands, shopping? 0  Preparing Food and eating ? N  Using the Toilet? N  In the past six months, have you accidently leaked urine? N  Do you have problems with loss of bowel control? N  Managing your Medications? N  Managing your Finances? N  Housekeeping or managing your Housekeeping? N    Patient Care Team: Antonetta Rollene BRAVO, MD as PCP - General  I have updated your Care Teams any recent Medical Services you may have received from other providers in the past year.     Assessment:   This is a routine wellness examination for Camiah.  Hearing/Vision screen Hearing Screening - Comments:: Patient has hearing aids, but she lost one. She is afraid of losing the other one so now she doesn't wear it very often Vision Screening - Comments:: Patient is not up to date on yearly eye exams.  Resources provided to patient for eye doctors.    Goals Addressed               This Visit's Progress     Activity and Exercise Increased (pt-stated)   On track     Notes: Patient had Covid in February 2025 into March 2025. She states she hasn't fully recovered. She always feels fatigued, as if she can't get her daily activities  completed because she is too tired, and if she is able to do them she only does them half way. We will send Dr. Antonetta a message asking for treatment suggestions and additional lab work.   Evidence-based guidance:  Review current exercise levels.  Assess patient perspective on exercise or activity level, barriers to increasing activity, motivation and readiness for change.  Recommend or set healthy exercise goal based on individual tolerance.  Encourage small steps toward making change in amount of exercise or activity.  Urge reduction of sedentary activities or screen time.  Promote group activities within the community or with family or support person.  Consider referral to rehabiliation therapist for assessment and exercise/activity plan.        Patient Stated   On track     Improve Health       Depression Screen     12/14/2023    1:22 PM 08/25/2023    2:46 PM 03/13/2023    2:12 PM 12/25/2022    1:51 PM 12/10/2022    1:46 PM 05/28/2022    2:42 PM 02/14/2022    4:24 PM  PHQ 2/9 Scores  PHQ - 2 Score 0 4 0 0 1 1 1   PHQ- 9 Score 0 14 4 4 4 4 8     Fall Risk     12/14/2023    1:16 PM 08/25/2023    2:45 PM 07/07/2023    4:12 PM 03/13/2023    2:12 PM 12/25/2022    1:51 PM  Fall Risk   Falls in the past year? 0 0 0 0 0  Number falls in past yr: 0 0 0 0 0  Injury with Fall? 0 0 0 0 0  Risk for fall due to : Impaired balance/gait;Impaired mobility No Fall Risks Impaired balance/gait No Fall Risks No Fall Risks  Follow up Falls prevention discussed;Education provided;Falls evaluation completed Falls evaluation completed Follow up appointment Falls evaluation completed Falls evaluation completed    MEDICARE RISK AT HOME:  Medicare Risk at Home Any stairs in or  around the home?: Yes If so, are there any without handrails?: No Home free of loose throw rugs in walkways, pet beds, electrical cords, etc?: Yes Adequate lighting in your home to reduce risk of falls?: Yes Life alert?:  No Use of a cane, walker or w/c?: Yes Grab bars in the bathroom?: No Shower chair or bench in shower?: Yes Elevated toilet seat or a handicapped toilet?: Yes  TIMED UP AND GO:  Was the test performed?  No  Cognitive Function: 6CIT completed        12/14/2023    1:21 PM 12/10/2022    1:46 PM 02/28/2020   11:13 AM 02/25/2019   10:26 AM 02/18/2018    2:19 PM  6CIT Screen  What Year? 0 points 0 points 0 points 0 points 0 points  What month? 0 points 0 points 0 points 0 points 0 points  What time? 0 points 0 points 0 points 0 points 0 points  Count back from 20 0 points 0 points 0 points 0 points 0 points  Months in reverse 0 points 0 points 0 points 0 points 0 points  Repeat phrase 0 points 0 points 0 points 0 points 2 points  Total Score 0 points 0 points 0 points 0 points 2 points    Immunizations Immunization History  Administered Date(s) Administered   Fluad Quad(high Dose 65+) 02/09/2020, 02/14/2022   Fluad Trivalent(High Dose 65+) 03/13/2023   Influenza Split 02/05/2011   Influenza Whole 01/21/2006, 05/31/2007, 02/25/2010   Influenza,inj,Quad PF,6+ Mos 03/15/2013   Moderna Sars-Covid-2 Vaccination 07/07/2019, 08/03/2019, 03/05/2020, 08/23/2020   Pneumococcal Conjugate-13 05/30/2015   Pneumococcal Polysaccharide-23 08/16/2008   Td 08/16/2008    Screening Tests Health Maintenance  Topic Date Due   Zoster Vaccines- Shingrix (1 of 2) Never done   DEXA SCAN  12/22/2021   COVID-19 Vaccine (5 - 2024-25 season) 12/28/2022   MAMMOGRAM  04/11/2023   INFLUENZA VACCINE  11/27/2023   DTaP/Tdap/Td (2 - Tdap) 07/06/2024 (Originally 08/17/2018)   Medicare Annual Wellness (AWV)  12/13/2024   Pneumococcal Vaccine: 50+ Years  Completed   HPV VACCINES  Aged Out   Meningococcal B Vaccine  Aged Out   Pneumococcal Vaccine  Discontinued    Health Maintenance  Health Maintenance Due  Topic Date Due   Zoster Vaccines- Shingrix (1 of 2) Never done   DEXA SCAN  12/22/2021    COVID-19 Vaccine (5 - 2024-25 season) 12/28/2022   MAMMOGRAM  04/11/2023   INFLUENZA VACCINE  11/27/2023   Health Maintenance Items Addressed: Mammogram ordered, DEXA ordered  Additional Screening:  Vision Screening: Recommended annual ophthalmology exams for early detection of glaucoma and other disorders of the eye. Would you like a referral to an eye doctor? No    Dental Screening: Recommended annual dental exams for proper oral hygiene  Community Resource Referral / Chronic Care Management: CRR required this visit?  No   CCM required this visit?  No   Plan:    I have personally reviewed and noted the following in the patient's chart:   Medical and social history Use of alcohol, tobacco or illicit drugs  Current medications and supplements including opioid prescriptions. Patient is not currently taking opioid prescriptions. Functional ability and status Nutritional status Physical activity Advanced directives List of other physicians Hospitalizations, surgeries, and ER visits in previous 12 months Vitals Screenings to include cognitive, depression, and falls Referrals and appointments  In addition, I have reviewed and discussed with patient certain preventive protocols, quality metrics, and  best practice recommendations. A written personalized care plan for preventive services as well as general preventive health recommendations were provided to patient.   Goldia Ligman, CMA   12/14/2023   After Visit Summary: (MyChart) Due to this being a telephonic visit, the after visit summary with patients personalized plan was offered to patient via MyChart   Notes: Nothing significant to report at this time.

## 2023-12-18 ENCOUNTER — Ambulatory Visit (INDEPENDENT_AMBULATORY_CARE_PROVIDER_SITE_OTHER)

## 2023-12-18 VITALS — BP 166/82 | HR 88 | Resp 16 | Ht 63.0 in | Wt 140.0 lb

## 2023-12-18 DIAGNOSIS — R5383 Other fatigue: Secondary | ICD-10-CM | POA: Diagnosis not present

## 2023-12-18 DIAGNOSIS — D649 Anemia, unspecified: Secondary | ICD-10-CM

## 2023-12-18 DIAGNOSIS — R7303 Prediabetes: Secondary | ICD-10-CM

## 2023-12-18 DIAGNOSIS — K743 Primary biliary cirrhosis: Secondary | ICD-10-CM | POA: Diagnosis not present

## 2023-12-18 MED ORDER — URSODIOL 300 MG PO CAPS: 300.0000 mg | ORAL_CAPSULE | Freq: Three times a day (TID) | ORAL | 3 refills | Status: AC

## 2023-12-18 NOTE — Progress Notes (Signed)
 Established Patient Office Visit  Subjective   Patient ID: Yolanda Foley, female    DOB: 11/25/1940  Age: 83 y.o. MRN: [id number]  Chief Complaint  Patient presents with   Fatigue    Pt has been having fatigue and low energy since covid in march, wants labs     HPI  Discussed the use of AI scribe software for clinical note transcription with the patient, who gave verbal consent to proceed.  History of Present Illness   Yolanda Foley is an 83 year old female with rheumatoid arthritis and cirrhosis who presents with fatigue and leg pain.  Fatigue and unintentional weight loss - Persistent fatigue since contracting COVID-19 in March - Associated weakness - Significant unintentional weight loss - No shortness of breath  Right lower extremity pain and impaired mobility - Pain localized to the right leg - Pain affects mobility, requiring use of arms to assist with standing  Blood pressure variability - Blood pressure was unexpectedly high this morning after consuming a salty snack - Typically experiences low blood pressure in the mornings  Nutritional intake and supplement use - Takes a supplement called 'Daily Five' - Attempts to manage nutrition, but sometimes feels she eats too much or too little  Rheumatologic evaluation and laboratory findings - Positive ANA test in 2023 - Referred to rheumatology and underwent extensive blood work at a cancer center - No further significant findings reported after evaluation  Iron metabolism abnormalities - History of elevated ferritin, most recently 805, elevated for years - Previously prescribed iron supplements during childbearing years for anemia - Currently advised against taking additional iron - Consumes spinach regularly, aware of its high iron content  Cirrhosis and hematologic effects - History of cirrhosis - Cirrhosis may be contributing to low hemoglobin levels       Patient Active Problem List   Diagnosis Date Noted    Back pain with radiculopathy 07/15/2023   Chronic pain of right knee 07/15/2023   Stress due to illness of family member 03/24/2023   Encounter for immunization 03/24/2023   Osteoarthritis of right knee 12/30/2022   Hair loss 05/31/2022   Headache 05/31/2022   Reduced vision 01/06/2021   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   High serum ferritin 11/24/2019   Low platelet count (HCC) 11/24/2019   Light headedness 10/14/2018   Stress and adjustment reaction 08/22/2018   Overweight (BMI 25.0-29.9) 03/12/2018   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   Osteoporosis 03/29/2013   Depression 03/30/2012   Hearing loss 03/30/2012   Prediabetes 10/09/2011   Transaminasemia 07/08/2011   Insomnia 02/09/2011   Cholelithiasis 08/05/2010   Anemia 06/22/2009   Fatigue 06/22/2009   ALLERGIC RHINITIS, SEASONAL 03/16/2008   Essential hypertension 07/19/2007   GERD 07/19/2007   Hyperlipemia 04/23/2007    ROS    Objective:     BP (!) 166/82   Pulse 88   Resp 16   Ht 5' 3 (1.6 m)   Wt 140 lb 0.6 oz (63.5 kg)   SpO2 96%   BMI 24.81 kg/m  BP Readings from Last 3 Encounters:  12/18/23 (!) 166/82  12/14/23 124/80  08/25/23 (!) 146/83   Wt Readings from Last 3 Encounters:  12/18/23 140 lb 0.6 oz (63.5 kg)  12/14/23 138 lb (62.6 kg)  08/25/23 140 lb (63.5 kg)      Physical Exam Vitals and nursing note reviewed.  Constitutional:      Appearance: Normal appearance.  HENT:  Head: Normocephalic.     Right Ear: Tympanic membrane, ear canal and external ear normal.     Left Ear: Tympanic membrane, ear canal and external ear normal.     Nose: Nose normal.     Mouth/Throat:     Mouth: Mucous membranes are moist.     Pharynx: Oropharynx is clear.  Eyes:     Extraocular Movements: Extraocular movements intact.     Pupils: Pupils are equal, round, and reactive to light.  Cardiovascular:     Rate and Rhythm: Normal rate and regular rhythm.   Pulmonary:     Effort: Pulmonary effort is normal.     Breath sounds: Normal breath sounds.  Musculoskeletal:     Cervical back: Normal range of motion and neck supple.  Neurological:     Mental Status: She is alert and oriented to person, place, and time.  Psychiatric:        Mood and Affect: Mood normal.        Thought Content: Thought content normal.     Last CBC Lab Results  Component Value Date   WBC 3.7 12/18/2023   HGB 10.1 (L) 12/18/2023   HCT 31.7 (L) 12/18/2023   MCV 91 12/18/2023   MCH 29.1 12/18/2023   RDW 13.9 12/18/2023   PLT 63 (LL) 12/18/2023   Last metabolic panel Lab Results  Component Value Date   GLUCOSE 78 12/18/2023   NA 139 12/18/2023   K 3.8 12/18/2023   CL 101 12/18/2023   CO2 24 12/18/2023   BUN 11 12/18/2023   CREATININE 0.89 12/18/2023   EGFR 63 12/18/2023   CALCIUM  9.5 12/18/2023   PROT 8.0 12/18/2023   ALBUMIN 4.0 12/18/2023   LABGLOB 4.0 12/18/2023   AGRATIO 1.1 (L) 08/01/2021   BILITOT 0.5 12/18/2023   ALKPHOS 322 (H) 12/18/2023   AST 57 (H) 12/18/2023   ALT 34 (H) 12/18/2023   Last lipids Lab Results  Component Value Date   CHOL 180 08/01/2021   HDL 64 08/01/2021   LDLCALC 104 (H) 08/01/2021   TRIG 64 08/01/2021   CHOLHDL 2.8 08/01/2021   Last hemoglobin A1c Lab Results  Component Value Date   HGBA1C 5.6 08/24/2017   Last thyroid  functions Lab Results  Component Value Date   TSH 1.460 12/18/2023   Last vitamin D  Lab Results  Component Value Date   VD25OH 31.9 08/01/2021   Last vitamin B12 and Folate Lab Results  Component Value Date   VITAMINB12 1,227 12/18/2023   FOLATE 14.4 12/18/2023      The ASCVD Risk score (Arnett DK, et al., 2019) failed to calculate for the following reasons:   The 2019 ASCVD risk score is only valid for ages 1 to 57    Assessment & Plan:   Problem List Items Addressed This Visit       Digestive   Hepatic cirrhosis due to primary biliary cholangitis (HCC)   Relevant  Medications   ursodiol  (ACTIGALL ) 300 MG capsule     Other   Anemia - Primary   Relevant Orders   CBC with Differential/Platelet (Completed)   Fe+TIBC+Fer (Completed)   B12 and Folate Panel (Completed)   Fatigue   Relevant Orders   CBC with Differential/Platelet (Completed)   Fe+TIBC+Fer (Completed)   TSH + free T4 (Completed)   Prediabetes   Relevant Orders   CMP14+EGFR (Completed)   Assessment and Plan    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  Hemoglobin at 9.9 indicates anemia. Elevated ferritin at 805 suggests iron overload. - Refilled Urso , instructed to take three times daily. - Ordered blood work to recheck liver function and anemia status. - Sent lab results to [facility] for liver function review. - Advised discontinuation of iron supplements. - Allowed dietary intake of iron-rich foods like spinach and beef.  Fatigue Persistent fatigue since COVID-19 infection. Anemia, iron overload, and stress from husband's health may contribute.  Right leg pain Chronic right leg pain, especially when rising from a seated position. Likely musculoskeletal or arthritis-related.  Suspected rheumatoid arthritis Suspected rheumatoid arthritis based on positive ANA test and hematologist's recommendation. Differentiated from osteoarthritis as an autoimmune disorder. - Consider referral to a rheumatologist for further evaluation.        No follow-ups on file.    Leita Longs, FNP

## 2023-12-19 LAB — CMP14+EGFR
ALT: 34 IU/L — ABNORMAL HIGH (ref 0–32)
AST: 57 IU/L — ABNORMAL HIGH (ref 0–40)
Albumin: 4 g/dL (ref 3.7–4.7)
Alkaline Phosphatase: 322 IU/L — ABNORMAL HIGH (ref 44–121)
BUN/Creatinine Ratio: 12 (ref 12–28)
BUN: 11 mg/dL (ref 8–27)
Bilirubin Total: 0.5 mg/dL (ref 0.0–1.2)
CO2: 24 mmol/L (ref 20–29)
Calcium: 9.5 mg/dL (ref 8.7–10.3)
Chloride: 101 mmol/L (ref 96–106)
Creatinine, Ser: 0.89 mg/dL (ref 0.57–1.00)
Globulin, Total: 4 g/dL (ref 1.5–4.5)
Glucose: 78 mg/dL (ref 70–99)
Potassium: 3.8 mmol/L (ref 3.5–5.2)
Sodium: 139 mmol/L (ref 134–144)
Total Protein: 8 g/dL (ref 6.0–8.5)
eGFR: 63 mL/min/1.73 (ref >59)

## 2023-12-19 LAB — CBC WITH DIFFERENTIAL/PLATELET
Basophils Absolute: 0 x10E3/uL (ref 0.0–0.2)
Basos: 0 %
EOS (ABSOLUTE): 0.1 x10E3/uL (ref 0.0–0.4)
Eos: 3 %
Hematocrit: 31.7 % — ABNORMAL LOW (ref 34.0–46.6)
Hemoglobin: 10.1 g/dL — ABNORMAL LOW (ref 11.1–15.9)
Immature Grans (Abs): 0 x10E3/uL (ref 0.0–0.1)
Immature Granulocytes: 0 %
Lymphocytes Absolute: 1 x10E3/uL (ref 0.7–3.1)
Lymphs: 28 %
MCH: 29.1 pg (ref 26.6–33.0)
MCHC: 31.9 g/dL (ref 31.5–35.7)
MCV: 91 fL (ref 79–97)
Monocytes Absolute: 0.3 x10E3/uL (ref 0.1–0.9)
Monocytes: 7 %
Neutrophils Absolute: 2.3 x10E3/uL (ref 1.4–7.0)
Neutrophils: 62 %
Platelets: 63 x10E3/uL — CL (ref 150–450)
RBC: 3.47 x10E6/uL — ABNORMAL LOW (ref 3.77–5.28)
RDW: 13.9 % (ref 11.7–15.4)
WBC: 3.7 x10E3/uL (ref 3.4–10.8)

## 2023-12-19 LAB — IRON,TIBC AND FERRITIN PANEL
Ferritin: 785 ng/mL — ABNORMAL HIGH (ref 15–150)
Iron Saturation: 28 % (ref 15–55)
Iron: 70 ug/dL (ref 27–139)
Total Iron Binding Capacity: 246 ug/dL — ABNORMAL LOW (ref 250–450)
UIBC: 176 ug/dL (ref 118–369)

## 2023-12-19 LAB — B12 AND FOLATE PANEL
Folate: 14.4 ng/mL (ref >3.0)
Vitamin B-12: 1227 pg/mL (ref 232–1245)

## 2023-12-19 LAB — TSH+FREE T4
Free T4: 1.12 ng/dL (ref 0.82–1.77)
TSH: 1.46 u[IU]/mL (ref 0.450–4.500)

## 2023-12-20 ENCOUNTER — Ambulatory Visit: Payer: Self-pay

## 2023-12-31 ENCOUNTER — Encounter (INDEPENDENT_AMBULATORY_CARE_PROVIDER_SITE_OTHER): Payer: Self-pay | Admitting: Gastroenterology

## 2024-02-03 DIAGNOSIS — H26492 Other secondary cataract, left eye: Secondary | ICD-10-CM | POA: Diagnosis not present

## 2024-02-03 DIAGNOSIS — H179 Unspecified corneal scar and opacity: Secondary | ICD-10-CM | POA: Diagnosis not present

## 2024-02-03 DIAGNOSIS — H40013 Open angle with borderline findings, low risk, bilateral: Secondary | ICD-10-CM | POA: Diagnosis not present

## 2024-02-03 DIAGNOSIS — H04123 Dry eye syndrome of bilateral lacrimal glands: Secondary | ICD-10-CM | POA: Diagnosis not present

## 2024-02-10 ENCOUNTER — Encounter (INDEPENDENT_AMBULATORY_CARE_PROVIDER_SITE_OTHER): Payer: Self-pay | Admitting: Gastroenterology

## 2024-02-29 ENCOUNTER — Encounter: Payer: Self-pay | Admitting: Radiology
